# Patient Record
Sex: Male | Born: 1960 | Race: White | Hispanic: No | Marital: Married | Smoking: Former smoker
Health system: Southern US, Community
[De-identification: ages and names within clinical notes are randomized; demographics above are authoritative.]

## PROBLEM LIST (undated history)

## (undated) DIAGNOSIS — S52352A Displaced comminuted fracture of shaft of radius, left arm, initial encounter for closed fracture: Secondary | ICD-10-CM

## (undated) DIAGNOSIS — D124 Benign neoplasm of descending colon: Secondary | ICD-10-CM

## (undated) DIAGNOSIS — D125 Benign neoplasm of sigmoid colon: Secondary | ICD-10-CM

## (undated) DIAGNOSIS — I10 Essential (primary) hypertension: Secondary | ICD-10-CM

## (undated) DIAGNOSIS — S52532A Colles' fracture of left radius, initial encounter for closed fracture: Secondary | ICD-10-CM

## (undated) DIAGNOSIS — S52513A Displaced fracture of unspecified radial styloid process, initial encounter for closed fracture: Secondary | ICD-10-CM

## (undated) DIAGNOSIS — S0292XA Unspecified fracture of facial bones, initial encounter for closed fracture: Secondary | ICD-10-CM

## (undated) HISTORY — PX: BRAIN SURGERY: SHX531

## (undated) HISTORY — DX: Benign neoplasm of descending colon: D12.4

## (undated) HISTORY — PX: NO PAST SURGERIES: SHX2092

## (undated) HISTORY — DX: Benign neoplasm of sigmoid colon: D12.5

## (undated) HISTORY — DX: Displaced fracture of unspecified radial styloid process, initial encounter for closed fracture: S52.513A

## (undated) HISTORY — DX: Displaced comminuted fracture of shaft of radius, left arm, initial encounter for closed fracture: S52.352A

## (undated) HISTORY — DX: Unspecified fracture of facial bones, initial encounter for closed fracture: S02.92XA

## (undated) HISTORY — DX: Colles' fracture of left radius, initial encounter for closed fracture: S52.532A

## (undated) HISTORY — PX: COSMETIC SURGERY: SHX468

---

## 2006-09-16 ENCOUNTER — Emergency Department: Payer: Self-pay | Admitting: Emergency Medicine

## 2007-04-23 ENCOUNTER — Emergency Department: Payer: Self-pay | Admitting: Emergency Medicine

## 2007-10-24 ENCOUNTER — Emergency Department: Payer: Self-pay | Admitting: Emergency Medicine

## 2010-10-04 ENCOUNTER — Emergency Department: Payer: Self-pay | Admitting: Unknown Physician Specialty

## 2011-08-10 LAB — CBC AND DIFFERENTIAL
NEUTROS ABS: 3 /uL
WBC: 6.8 10^3/mL

## 2011-08-10 LAB — LIPID PANEL
Cholesterol: 187 mg/dL (ref 0–200)
HDL: 48 mg/dL (ref 35–70)
LDL Cholesterol: 120 mg/dL
LDl/HDL Ratio: 2.5
TRIGLYCERIDES: 96 mg/dL (ref 40–160)

## 2011-08-10 LAB — BASIC METABOLIC PANEL
BUN: 18 mg/dL (ref 4–21)
CREATININE: 1.1 mg/dL (ref 0.6–1.3)
Glucose: 84 mg/dL
Sodium: 140 mmol/L (ref 137–147)

## 2011-08-10 LAB — HEPATIC FUNCTION PANEL
ALK PHOS: 53 U/L (ref 25–125)
ALT: 21 U/L (ref 10–40)
AST: 21 U/L (ref 14–40)
BILIRUBIN, TOTAL: 0.4 mg/dL

## 2011-08-10 LAB — PSA: PSA: 0.9

## 2014-09-07 ENCOUNTER — Other Ambulatory Visit: Payer: Self-pay | Admitting: Family Medicine

## 2014-10-18 ENCOUNTER — Other Ambulatory Visit: Payer: Self-pay | Admitting: Family Medicine

## 2014-11-14 ENCOUNTER — Other Ambulatory Visit: Payer: Self-pay | Admitting: Family Medicine

## 2014-12-14 ENCOUNTER — Other Ambulatory Visit: Payer: Self-pay | Admitting: Family Medicine

## 2015-01-01 ENCOUNTER — Emergency Department
Admission: EM | Admit: 2015-01-01 | Discharge: 2015-01-02 | Disposition: A | Discharge status: Other Acute Inpatient Hospital | Payer: Self-pay | Attending: Emergency Medicine | Admitting: Emergency Medicine

## 2015-01-01 ENCOUNTER — Encounter: Payer: Self-pay | Admitting: *Deleted

## 2015-01-01 DIAGNOSIS — T1490XA Injury, unspecified, initial encounter: Secondary | ICD-10-CM

## 2015-01-01 DIAGNOSIS — T22112A Burn of first degree of left forearm, initial encounter: Secondary | ICD-10-CM | POA: Insufficient documentation

## 2015-01-01 DIAGNOSIS — Y9389 Activity, other specified: Secondary | ICD-10-CM | POA: Insufficient documentation

## 2015-01-01 DIAGNOSIS — Z87891 Personal history of nicotine dependence: Secondary | ICD-10-CM | POA: Insufficient documentation

## 2015-01-01 DIAGNOSIS — Z79899 Other long term (current) drug therapy: Secondary | ICD-10-CM | POA: Insufficient documentation

## 2015-01-01 DIAGNOSIS — J705 Respiratory conditions due to smoke inhalation: Secondary | ICD-10-CM | POA: Insufficient documentation

## 2015-01-01 DIAGNOSIS — IMO0002 Reserved for concepts with insufficient information to code with codable children: Secondary | ICD-10-CM

## 2015-01-01 DIAGNOSIS — I10 Essential (primary) hypertension: Secondary | ICD-10-CM | POA: Insufficient documentation

## 2015-01-01 DIAGNOSIS — T24011A Burn of unspecified degree of right thigh, initial encounter: Secondary | ICD-10-CM | POA: Insufficient documentation

## 2015-01-01 DIAGNOSIS — W368XXA Explosion and rupture of other gas cylinder, initial encounter: Secondary | ICD-10-CM | POA: Insufficient documentation

## 2015-01-01 DIAGNOSIS — T22211A Burn of second degree of right forearm, initial encounter: Secondary | ICD-10-CM | POA: Insufficient documentation

## 2015-01-01 DIAGNOSIS — Y9289 Other specified places as the place of occurrence of the external cause: Secondary | ICD-10-CM | POA: Insufficient documentation

## 2015-01-01 DIAGNOSIS — T24012A Burn of unspecified degree of left thigh, initial encounter: Secondary | ICD-10-CM | POA: Insufficient documentation

## 2015-01-01 DIAGNOSIS — Y998 Other external cause status: Secondary | ICD-10-CM | POA: Insufficient documentation

## 2015-01-01 HISTORY — DX: Essential (primary) hypertension: I10

## 2015-01-01 LAB — CBC WITH DIFFERENTIAL/PLATELET
BASOS ABS: 0.1 10*3/uL (ref 0–0.1)
Basophils Relative: 1 %
EOS PCT: 6 %
Eosinophils Absolute: 0.5 10*3/uL (ref 0–0.7)
HCT: 45.3 % (ref 40.0–52.0)
Hemoglobin: 15.3 g/dL (ref 13.0–18.0)
LYMPHS PCT: 29 %
Lymphs Abs: 2.3 10*3/uL (ref 1.0–3.6)
MCH: 30.7 pg (ref 26.0–34.0)
MCHC: 33.7 g/dL (ref 32.0–36.0)
MCV: 91 fL (ref 80.0–100.0)
MONOS PCT: 8 %
Monocytes Absolute: 0.6 10*3/uL (ref 0.2–1.0)
Neutro Abs: 4.6 10*3/uL (ref 1.4–6.5)
Neutrophils Relative %: 56 %
Platelets: 252 10*3/uL (ref 150–440)
RBC: 4.98 MIL/uL (ref 4.40–5.90)
RDW: 11.9 % (ref 11.5–14.5)
WBC: 8.1 10*3/uL (ref 3.8–10.6)

## 2015-01-01 LAB — BASIC METABOLIC PANEL
ANION GAP: 6 (ref 5–15)
BUN: 24 mg/dL — ABNORMAL HIGH (ref 6–20)
CALCIUM: 9.2 mg/dL (ref 8.9–10.3)
CO2: 27 mmol/L (ref 22–32)
CREATININE: 1.28 mg/dL — AB (ref 0.61–1.24)
Chloride: 102 mmol/L (ref 101–111)
GFR calc Af Amer: >60 mL/min (ref >60)
GLUCOSE: 93 mg/dL (ref 65–99)
Potassium: 3.9 mmol/L (ref 3.5–5.1)
Sodium: 135 mmol/L (ref 135–145)

## 2015-01-01 MED ORDER — OXYCODONE-ACETAMINOPHEN 5-325 MG PO TABS: 1.0000 | ORAL_TABLET | Freq: Four times a day (QID) | ORAL | Status: DC | PRN

## 2015-01-01 MED ORDER — MORPHINE SULFATE (PF) 4 MG/ML IV SOLN
4.0000 mg | Freq: Once | INTRAVENOUS | Status: AC
  Administered 2015-01-01: 4 mg via INTRAVENOUS
  Filled 2015-01-01: qty 1

## 2015-01-01 MED ORDER — SILVER SULFADIAZINE 1 % EX CREA: TOPICAL_CREAM | CUTANEOUS | Status: DC

## 2015-01-01 MED ORDER — SILVER SULFADIAZINE 1 % EX CREA: TOPICAL_CREAM | Freq: Once | CUTANEOUS | Status: DC

## 2015-01-01 MED ORDER — SODIUM CHLORIDE 0.9 % IV SOLN
Freq: Once | INTRAVENOUS | Status: AC
  Administered 2015-01-01: 21:00:00 via INTRAVENOUS
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
## 2015-01-01 NOTE — Discharge Instructions (Signed)
Return immediately for any throat pain, difficulty breathing or for any other concerning or worsening symptoms. Burn Care Burns hurt your skin. When your skin is hurt, it is easier to get an infection. Follow your doctor's directions to help prevent an infection. HOME CARE  Wash your hands well before you change your bandage.  Change your bandage as often as told by your doctor.  Remove the old bandage. If the bandage sticks, soak it off with cool, clean water.  Gently clean the burn with mild soap and water.  Pat the burn dry with a clean, dry cloth.  Put a thin layer of medicated cream on the burn.  Put a clean bandage on as told by your doctor.  Keep the bandage clean and dry.  Raise (elevate) the burn for the first 24 hours. After that, follow your doctor's directions.  Only take medicine as told by your doctor. GET HELP RIGHT AWAY IF:   You have too much pain.  The skin near the burn is red, tender, puffy (swollen), or has red streaks.  The burn area has yellowish white fluid (pus) or a bad smell coming from it.  You have a fever. MAKE SURE YOU:   Understand these instructions.  Will watch your condition.  Will get help right away if you are not doing well or get worse. Document Released: 12/28/2007 Document Revised: 06/12/2011 Document Reviewed: 08/10/2010 [facility] Patient Information 2015 [place], [place]. This information is not intended to replace advice given to you by your health care provider. Make sure you discuss any questions you have with your health care provider.  Second-Degree Burn A second-degree burn affects the 2 outer layers of skin. The outer layer (epidermis) and the layer underneath it (dermis) are both burned. Another name for this type of burn is a partial thickness burn. A second-degree burn may be called minor or major. This depends on the size of the burn. It also depends on what parts of the skin are burned. Minor burns may be treated with  first aid. Major burns are a medical emergency. A second-degree burn is worse than a first-degree burn, but not as bad as a third-degree burn. A first-degree burn affects only the epidermis. A third-degree burn goes through all the layers of skin. A second-degree burn usually heals in 3 to 4 weeks. A minor second-degree burn usually does not leave a scar.Deeper second-degree burns may lead to scarring of the skin or contractures over joints.Contractures are scars that form over joints and may lead to reduced mobility at those joints. CAUSES  Heat (thermal) injury. This happens when skin comes in contact with something very hot. It could be a flame, a hot object, hot liquid, or steam. Most second-degree burns are thermal injuries.  Radiation. Sunlight is one type of radiation that can burn the skin. Another type of radiation is used to heat food. Radiation is also used to treat some diseases, such as cancer. All types of radiation can burn the skin. Sunlight usually causes a first-degree burn. Radiation used for heating food or treating a disease can cause a second-degree burn.  Electricity. Electrical burns can cause more damage under the skin than on the surface. They should always be treated as major burns.  Chemicals. Many chemicals can burn the skin. The burn should be flushed with cool water and checked by an emergency caregiver. SYMPTOMS Symptoms of second-degree burns include:  Severe pain.  Extreme tenderness.  Deep redness.  Blistered skin.  Skin that  has changed color.It might look blotchy, wet, or shiny.  Swelling. TREATMENT Some second-degree burns may need to be treated in a hospital. These include major burns, electrical burns, and chemical burns. Many other second-degree burns can be treated with regular first aid, such as:  Cooling the burn. Use cool, germ-free (sterile) salt water. Place the burned area of skin into a tub of water, or cover the burned area with clean,  wet towels.  Taking pain medicine.  Removing the dead skin from broken blisters. A trained caregiver may do this. Do not pop blisters.  Gently washing your skin with mild soap.  Covering the burned area with a cream.Silver sulfadiazine is a cream for burns. An antibiotic cream, such as bacitracin, may also be used to fight infection. Do not use other ointments or creams unless your caregiver says it is okay.  Protecting the burn with a sterile, non-sticky bandage.  Bandaging fingers and toes separately. This keeps them from sticking together.  Taking an antibiotic. This can help prevent infection.  Getting a tetanus shot. HOME CARE INSTRUCTIONS Medication  Take any medicine prescribed by your caregiver. Follow the directions carefully.  Ask your caregiver if you can take over-the-counter medicine to relieve pain and swelling. Do not give aspirin to children.  Make sure your caregiver knows about all other medicines you take.This includes over-the-counter medicines. Burn care  You will need to change the bandage on your burn. You may need to do this 2 or 3 times each day.  Gently clean the burned area.  Put ointment on it.  Cover the burn with a sterile bandage.  For some deeper burns or burns that cover a large area, compression garments may be prescribed. These garments can help minimize scarring and protect your mobility.  Do not put butter or oil on your skin. Use only the cream prescribed by your caregiver.  Do not put ice on your burn.  Do not break blisters on your skin.  Keep the bandaged area dry. You might need to take a sponge bath for awhile.Ask your caregiver when you can take a shower or a tub bath again.  Do not scratch an itchy burn. Your caregiver may give you medicine to relieve very bad itching.  Infection is a big danger after a second-degree burn. Tell your caregiver right away if you have signs of infection, such as:  Redness or changing color  in the burned area.  Fluid leaking from the burn.  Swelling in the burn area.  A bad smell coming from the wound. Follow-up  Keep all follow-up appointments.This is important. This is how your caregiver can tell if your treatment is working.  Protect your burn from sunlight.Use sunscreen whenever you go outside.Burned areas may be sensitive to the sun for up to 1 year. Exposure to the sun may also cause permanent darkening of scars. SEEK MEDICAL CARE IF:  You have any questions about medicines.  You have any questions about your treatment.  You wonder if it is okay to do a particular activity.  You develop a fever of more than 100.5 F (38.1 C). SEEK IMMEDIATE MEDICAL CARE IF:  You think your burn might be infected. It may change color, become red, leak fluid, swell, or smell bad.  You develop a fever of more than 102 F (38.9 C). Document Released: 08/22/2010 Document Revised: 06/12/2011 Document Reviewed: 08/22/2010 [facility] Patient Information 2015 [place], [place]. This information is not intended to replace advice given to you by  your health care provider. Make sure you discuss any questions you have with your health care provider. ° °
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
## 2015-01-01 NOTE — ED Notes (Signed)
Patient requesting not to be transferred to [facility], patient reports "I am not having any trouble breathing..my lungs are fine." Dr. Clearnce Hasten in room to discuss the risks of not being transferred. Patient verbalized understanding of risks and reports he does not want to be transferred. Patient verbalizes he will be leaving against medical advice. Patient denies difficulty breathing, patient speaking in complete sentences, no increased work in breathing noted.
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
## 2015-01-02 MED ORDER — MORPHINE SULFATE (PF) 4 MG/ML IV SOLN
INTRAVENOUS | Status: AC
  Filled 2015-01-02: qty 1

## 2015-01-02 MED ORDER — MORPHINE SULFATE (PF) 4 MG/ML IV SOLN
4.0000 mg | Freq: Once | INTRAVENOUS | Status: AC
  Administered 2015-01-02: 4 mg via INTRAVENOUS
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
## 2015-01-13 DIAGNOSIS — T22219A Burn of second degree of unspecified forearm, initial encounter: Secondary | ICD-10-CM | POA: Insufficient documentation

## 2015-01-13 DIAGNOSIS — T24299S Burn of second degree of multiple sites of unspecified lower limb, except ankle and foot, sequela: Secondary | ICD-10-CM

## 2015-01-13 HISTORY — DX: Burn of second degree of multiple sites of unspecified lower limb, except ankle and foot, sequela: T24.299S

## 2015-01-16 ENCOUNTER — Other Ambulatory Visit: Payer: Self-pay | Admitting: Family Medicine

## 2015-01-28 DIAGNOSIS — G4700 Insomnia, unspecified: Secondary | ICD-10-CM | POA: Insufficient documentation

## 2015-01-28 DIAGNOSIS — F1020 Alcohol dependence, uncomplicated: Secondary | ICD-10-CM | POA: Insufficient documentation

## 2015-01-28 DIAGNOSIS — A630 Anogenital (venereal) warts: Secondary | ICD-10-CM | POA: Insufficient documentation

## 2015-01-28 DIAGNOSIS — I10 Essential (primary) hypertension: Secondary | ICD-10-CM | POA: Insufficient documentation

## 2015-01-28 HISTORY — DX: Alcohol dependence, uncomplicated: F10.20

## 2015-02-08 ENCOUNTER — Encounter: Payer: Self-pay | Admitting: Family Medicine

## 2015-02-08 ENCOUNTER — Ambulatory Visit (INDEPENDENT_AMBULATORY_CARE_PROVIDER_SITE_OTHER): Payer: Self-pay | Admitting: Family Medicine

## 2015-02-08 VITALS — BP 112/78 | HR 76 | Temp 97.6°F | Resp 16 | Wt 206.0 lb

## 2015-02-08 DIAGNOSIS — T24299S Burn of second degree of multiple sites of unspecified lower limb, except ankle and foot, sequela: Secondary | ICD-10-CM

## 2015-02-08 DIAGNOSIS — M545 Low back pain: Secondary | ICD-10-CM

## 2015-02-08 DIAGNOSIS — G4700 Insomnia, unspecified: Secondary | ICD-10-CM

## 2015-02-08 DIAGNOSIS — I10 Essential (primary) hypertension: Secondary | ICD-10-CM

## 2015-02-08 MED ORDER — NAPROXEN 500 MG PO TABS: 500.0000 mg | ORAL_TABLET | Freq: Two times a day (BID) | ORAL | Status: DC
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
## 2015-02-13 ENCOUNTER — Other Ambulatory Visit: Payer: Self-pay | Admitting: Family Medicine

## 2015-03-12 ENCOUNTER — Other Ambulatory Visit: Payer: Self-pay | Admitting: Family Medicine

## 2015-04-10 ENCOUNTER — Other Ambulatory Visit: Payer: Self-pay | Admitting: Family Medicine

## 2015-05-15 ENCOUNTER — Other Ambulatory Visit: Payer: Self-pay | Admitting: Family Medicine

## 2015-06-12 ENCOUNTER — Other Ambulatory Visit: Payer: Self-pay | Admitting: Family Medicine

## 2015-07-09 ENCOUNTER — Other Ambulatory Visit: Payer: Self-pay | Admitting: Family Medicine

## 2015-08-11 ENCOUNTER — Encounter: Payer: Self-pay | Admitting: Family Medicine

## 2015-08-11 ENCOUNTER — Ambulatory Visit (INDEPENDENT_AMBULATORY_CARE_PROVIDER_SITE_OTHER): Payer: 59 | Admitting: Family Medicine

## 2015-08-11 ENCOUNTER — Telehealth: Payer: Self-pay | Admitting: Family Medicine

## 2015-08-11 VITALS — BP 118/82 | HR 80 | Temp 97.5°F | Resp 18 | Ht 73.0 in | Wt 201.0 lb

## 2015-08-11 DIAGNOSIS — Z125 Encounter for screening for malignant neoplasm of prostate: Secondary | ICD-10-CM | POA: Diagnosis not present

## 2015-08-11 DIAGNOSIS — Z0000 Encounter for general adult medical examination without abnormal findings: Secondary | ICD-10-CM

## 2015-08-11 DIAGNOSIS — Z1211 Encounter for screening for malignant neoplasm of colon: Secondary | ICD-10-CM

## 2015-08-11 LAB — POCT URINALYSIS DIPSTICK
BILIRUBIN UA: NEGATIVE
Blood, UA: NEGATIVE
Glucose, UA: NEGATIVE
KETONES UA: NEGATIVE
Leukocytes, UA: NEGATIVE
Nitrite, UA: NEGATIVE
PROTEIN UA: NEGATIVE
SPEC GRAV UA: 1.015
Urobilinogen, UA: 0.2
pH, UA: 5

## 2015-08-11 MED ORDER — CYCLOBENZAPRINE HCL 10 MG PO TABS: 10.0000 mg | ORAL_TABLET | Freq: Three times a day (TID) | ORAL | Status: DC | PRN

## 2015-08-11 NOTE — Telephone Encounter (Signed)
Took care of this and pt was advised-aa
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
## 2015-08-11 NOTE — Telephone Encounter (Signed)
Pt stated that when he was in the office today for his CPE and he had discussed needing a new RX for Flexeril (cyclobenzaprine). Pt stated that he was advised by Dr. Rosanna Randy that he would send the RX to [place]. Please advise. Thanks TNP

## 2015-08-14 LAB — CBC WITH DIFFERENTIAL/PLATELET
Basophils Absolute: 0.1 10*3/uL (ref 0.0–0.2)
Basos: 2 %
EOS (ABSOLUTE): 0.4 10*3/uL (ref 0.0–0.4)
Eos: 9 %
HEMATOCRIT: 40.6 % (ref 37.5–51.0)
Hemoglobin: 13.5 g/dL (ref 12.6–17.7)
IMMATURE GRANULOCYTES: 0 %
Immature Grans (Abs): 0 10*3/uL (ref 0.0–0.1)
LYMPHS ABS: 1.8 10*3/uL (ref 0.7–3.1)
Lymphs: 35 %
MCH: 30.7 pg (ref 26.6–33.0)
MCHC: 33.3 g/dL (ref 31.5–35.7)
MCV: 92 fL (ref 79–97)
MONOS ABS: 0.4 10*3/uL (ref 0.1–0.9)
Monocytes: 9 %
NEUTROS PCT: 45 %
Neutrophils Absolute: 2.3 10*3/uL (ref 1.4–7.0)
PLATELETS: 263 10*3/uL (ref 150–379)
RBC: 4.4 x10E6/uL (ref 4.14–5.80)
RDW: 12.7 % (ref 12.3–15.4)
WBC: 5.1 10*3/uL (ref 3.4–10.8)

## 2015-08-14 LAB — COMPREHENSIVE METABOLIC PANEL
A/G RATIO: 1.9 (ref 1.2–2.2)
ALK PHOS: 53 IU/L (ref 39–117)
ALT: 16 IU/L (ref 0–44)
AST: 15 IU/L (ref 0–40)
Albumin: 4.3 g/dL (ref 3.5–5.5)
BUN/Creatinine Ratio: 15 (ref 9–20)
BUN: 19 mg/dL (ref 6–24)
Bilirubin Total: 0.5 mg/dL (ref 0.0–1.2)
CALCIUM: 9.4 mg/dL (ref 8.7–10.2)
CHLORIDE: 102 mmol/L (ref 96–106)
CO2: 24 mmol/L (ref 18–29)
CREATININE: 1.25 mg/dL (ref 0.76–1.27)
GFR calc Af Amer: 74 mL/min/{1.73_m2} (ref >59)
GFR, EST NON AFRICAN AMERICAN: 64 mL/min/{1.73_m2} (ref >59)
GLOBULIN, TOTAL: 2.3 g/dL (ref 1.5–4.5)
Glucose: 60 mg/dL — ABNORMAL LOW (ref 65–99)
POTASSIUM: 4.6 mmol/L (ref 3.5–5.2)
Sodium: 140 mmol/L (ref 134–144)
Total Protein: 6.6 g/dL (ref 6.0–8.5)

## 2015-08-14 LAB — LIPID PANEL WITH LDL/HDL RATIO
Cholesterol, Total: 174 mg/dL (ref 100–199)
HDL: 36 mg/dL — AB (ref >39)
LDL Calculated: 123 mg/dL — ABNORMAL HIGH (ref 0–99)
LDl/HDL Ratio: 3.4 ratio units (ref 0.0–3.6)
TRIGLYCERIDES: 73 mg/dL (ref 0–149)
VLDL Cholesterol Cal: 15 mg/dL (ref 5–40)

## 2015-08-14 LAB — TSH: TSH: 1.44 u[IU]/mL (ref 0.450–4.500)

## 2015-08-14 LAB — PSA: Prostate Specific Ag, Serum: 0.8 ng/mL (ref 0.0–4.0)

## 2015-08-26 ENCOUNTER — Telehealth: Payer: Self-pay

## 2015-08-26 ENCOUNTER — Other Ambulatory Visit: Payer: Self-pay

## 2015-08-26 NOTE — Telephone Encounter (Signed)
Gastroenterology Pre-Procedure Review  Request Date: 10/12/15 Requesting Physician: Dr. Rosanna Randy   PATIENT REVIEW QUESTIONS: The patient responded to the following health history questions as indicated:    1. Are you having any GI issues? no 2. Do you have a personal history of Polyps? no 3. Do you have a family history of Colon Cancer or Polyps? no 4. Diabetes Mellitus? no 5. Joint replacements in the past 12 months?no 6. Major health problems in the past 3 months?no 7. Any artificial heart valves, MVP, or defibrillator?no    MEDICATIONS & ALLERGIES:    Patient reports the following regarding taking any anticoagulation/antiplatelet therapy:   Plavix, Coumadin, Eliquis, Xarelto, Lovenox, Pradaxa, Brilinta, or Effient? no Aspirin? no  Patient confirms/reports the following medications:  Current Outpatient Prescriptions  Medication Sig Dispense Refill  . amitriptyline (ELAVIL) 25 MG tablet TAKE ONE TABLET BY MOUTH AT BEDTIME AS NEEDED FOR SLEEP 30 tablet 12  . ascorbic acid (VITAMIN C) 100 MG tablet Take by mouth. Reported on 08/11/2015    . cyclobenzaprine (FLEXERIL) 10 MG tablet Take 1 tablet (10 mg total) by mouth 3 (three) times daily as needed for muscle spasms. 90 tablet 12  . Garlic 99991111 MG CAPS Take by mouth.    Marland Kitchen GLUCOSAMINE-CHONDROIT-VIT C-MN PO Take by mouth.    Marland Kitchen lisinopril (PRINIVIL,ZESTRIL) 10 MG tablet TAKE ONE TABLET BY MOUTH ONCE DAILY 30 tablet 12  . Multiple Vitamin tablet Take by mouth.    . naproxen (NAPROSYN) 500 MG tablet Take 1 tablet (500 mg total) by mouth 2 (two) times daily with a meal. 60 tablet 12   No current facility-administered medications for this visit.    Patient confirms/reports the following allergies:  No Known Allergies  No orders of the defined types were placed in this encounter.    AUTHORIZATION INFORMATION Primary Insurance: 1D#: Group #:  Secondary Insurance: 1D#: Group #:  SCHEDULE INFORMATION: Date:  10/12/15 Time: Location: [facility]

## 2015-10-11 ENCOUNTER — Encounter: Payer: Self-pay | Admitting: *Deleted

## 2015-10-12 ENCOUNTER — Encounter
Admission: RE | Disposition: A | Discharge status: Home / Self Care | Payer: Self-pay | Source: Ambulatory Visit | Attending: Gastroenterology

## 2015-10-12 ENCOUNTER — Encounter: Payer: Self-pay | Admitting: *Deleted

## 2015-10-12 ENCOUNTER — Ambulatory Visit
Admission: RE | Admit: 2015-10-12 | Discharge: 2015-10-12 | Disposition: A | Discharge status: Home / Self Care | Payer: 59 | Source: Ambulatory Visit | Attending: Gastroenterology | Admitting: Gastroenterology

## 2015-10-12 ENCOUNTER — Ambulatory Visit: Payer: 59 | Admitting: Anesthesiology

## 2015-10-12 DIAGNOSIS — Z1211 Encounter for screening for malignant neoplasm of colon: Secondary | ICD-10-CM | POA: Diagnosis present

## 2015-10-12 DIAGNOSIS — D125 Benign neoplasm of sigmoid colon: Secondary | ICD-10-CM | POA: Diagnosis not present

## 2015-10-12 DIAGNOSIS — I10 Essential (primary) hypertension: Secondary | ICD-10-CM | POA: Diagnosis not present

## 2015-10-12 DIAGNOSIS — Z87891 Personal history of nicotine dependence: Secondary | ICD-10-CM | POA: Insufficient documentation

## 2015-10-12 DIAGNOSIS — K641 Second degree hemorrhoids: Secondary | ICD-10-CM | POA: Diagnosis not present

## 2015-10-12 DIAGNOSIS — D124 Benign neoplasm of descending colon: Secondary | ICD-10-CM | POA: Diagnosis not present

## 2015-10-12 DIAGNOSIS — D123 Benign neoplasm of transverse colon: Secondary | ICD-10-CM | POA: Diagnosis not present

## 2015-10-12 HISTORY — PX: COLONOSCOPY WITH PROPOFOL: SHX5780

## 2015-10-12 SURGERY — COLONOSCOPY WITH PROPOFOL
Anesthesia: General

## 2015-10-12 MED ORDER — LACTATED RINGERS IV SOLN
INTRAVENOUS | Status: DC | PRN
  Administered 2015-10-12: 09:00:00 via INTRAVENOUS

## 2015-10-12 MED ORDER — MIDAZOLAM HCL 2 MG/2ML IJ SOLN
INTRAMUSCULAR | Status: DC | PRN
  Administered 2015-10-12: 1 mg via INTRAVENOUS

## 2015-10-12 MED ORDER — SODIUM CHLORIDE 0.9 % IV SOLN
INTRAVENOUS | Status: DC
  Administered 2015-10-12: 1000 mL via INTRAVENOUS

## 2015-10-12 MED ORDER — PROPOFOL 500 MG/50ML IV EMUL
INTRAVENOUS | Status: DC | PRN
  Administered 2015-10-12: 100 ug/kg/min via INTRAVENOUS

## 2015-10-12 MED ORDER — PROPOFOL 10 MG/ML IV BOLUS
INTRAVENOUS | Status: DC | PRN
  Administered 2015-10-12: 50 mg via INTRAVENOUS

## 2015-10-12 NOTE — Anesthesia Preprocedure Evaluation (Signed)
Anesthesia Evaluation  Patient identified by MRN, date of birth, ID band Patient awake    Reviewed: Allergy & Precautions, NPO status , Patient's Chart, lab work & pertinent test results  Airway Mallampati: II       Dental  (+) Teeth Intact   Pulmonary former smoker,    breath sounds clear to auscultation       Cardiovascular Exercise Tolerance: Good hypertension, Pt. on medications  Rhythm:Regular Rate:Normal     Neuro/Psych    GI/Hepatic negative GI ROS, (+) Cirrhosis     substance abuse  alcohol use,   Endo/Other  negative endocrine ROS  Renal/GU negative Renal ROS     Musculoskeletal negative musculoskeletal ROS (+)   Abdominal Normal abdominal exam  (+)   Peds negative pediatric ROS (+)  Hematology negative hematology ROS (+)   Anesthesia Other Findings   Reproductive/Obstetrics                             Anesthesia Physical Anesthesia Plan  ASA: II  Anesthesia Plan: General   Post-op Pain Management:    Induction: Intravenous  Airway Management Planned: Natural Airway and Nasal Cannula  Additional Equipment:   Intra-op Plan:   Post-operative Plan:   Informed Consent: I have reviewed the patients History and Physical, chart, labs and discussed the procedure including the risks, benefits and alternatives for the proposed anesthesia with the patient or authorized representative who has indicated his/her understanding and acceptance.     Plan Discussed with: CRNA  Anesthesia Plan Comments:         Anesthesia Quick Evaluation

## 2015-10-12 NOTE — Transfer of Care (Signed)
Immediate Anesthesia Transfer of Care Note  Patient: Justin Fox  Procedure(s) Performed: Procedure(s): COLONOSCOPY WITH PROPOFOL (N/A)  Patient Location: PACU  Anesthesia Type:General  Level of Consciousness: sedated  Airway & Oxygen Therapy: Patient Spontanous Breathing  Post-op Assessment: Report given to RN  Post vital signs: Reviewed and stable  Last Vitals:  Filed Vitals:   10/12/15 0833  BP: 132/89  Pulse: 74  Temp: 36.2 C  Resp: 18    Last Pain: There were no vitals filed for this visit.       Complications: No apparent anesthesia complications

## 2015-10-12 NOTE — H&P (Signed)
  Lucilla Lame, MD [address] [place], [place] [address] Phone: [phone] Fax : [phone]  Primary Care Physician:  [name], MD Primary Gastroenterologist:  Dr. Allen Norris  Pre-Procedure History & Physical: HPI:  Justin Fox is a 55 y.o. male is here for a screening colonoscopy.   Past Medical History  Diagnosis Date  . Hypertension     Past Surgical History  Procedure Laterality Date  . No past surgeries      Prior to Admission medications   Medication Sig Start Date End Date Taking? Authorizing Provider  lisinopril (PRINIVIL,ZESTRIL) 10 MG tablet TAKE ONE TABLET BY MOUTH ONCE DAILY 07/09/15  Yes Richard Maceo Pro., MD  amitriptyline (ELAVIL) 25 MG tablet TAKE ONE TABLET BY MOUTH AT BEDTIME AS NEEDED FOR SLEEP 07/09/15   Richard Maceo Pro., MD  ascorbic acid (VITAMIN C) 100 MG tablet Take by mouth. Reported on 08/11/2015    Historical Provider, MD  cyclobenzaprine (FLEXERIL) 10 MG tablet Take 1 tablet (10 mg total) by mouth 3 (three) times daily as needed for muscle spasms. 08/11/15   Richard Maceo Pro., MD  Garlic 99991111 MG CAPS Take by mouth.    Historical Provider, MD  GLUCOSAMINE-CHONDROIT-VIT C-MN PO Take by mouth. 04/11/12   Historical Provider, MD  Multiple Vitamin tablet Take by mouth.    Historical Provider, MD  naproxen (NAPROSYN) 500 MG tablet Take 1 tablet (500 mg total) by mouth 2 (two) times daily with a meal. 02/08/15   Jerrol Banana., MD    Allergies as of 08/26/2015  . (No Known Allergies)    Family History  Problem Relation Age of Onset  . Anxiety disorder Mother   . Thyroid disease Mother     Social History   Social History  . Marital Status: Married    Spouse Name: N/A  . Number of Children: N/A  . Years of Education: N/A   Occupational History  . Not on file.   Social History Main Topics  . Smoking status: Former Smoker -- 1.50 packs/day for 35 years    Types: Cigarettes    Quit date: 04/03/2008  .  Smokeless tobacco: Never Used     Comment: Has been quit for 7-8 years  . Alcohol Use: No     Comment: Has had issues with this in the past. Has not drank in 7-8 years.  . Drug Use: No  . Sexual Activity: No   Other Topics Concern  . Not on file   Social History Narrative    Review of Systems: See HPI, otherwise negative ROS  Physical Exam: BP 132/89 mmHg  Pulse 74  Temp(Src) 97.1 F (36.2 C) (Tympanic)  Resp 18  Ht 6\' 1"  (1.854 m)  Wt 190 lb (86.183 kg)  BMI 25.07 kg/m2  SpO2 100% General:   Alert,  pleasant and cooperative in NAD Head:  Normocephalic and atraumatic. Neck:  Supple; no masses or thyromegaly. Lungs:  Clear throughout to auscultation.    Heart:  Regular rate and rhythm. Abdomen:  Soft, nontender and nondistended. Normal bowel sounds, without guarding, and without rebound.   Neurologic:  Alert and  oriented x4;  grossly normal neurologically.  Impression/Plan: Justin Fox is now here to undergo a screening colonoscopy.  Risks, benefits, and alternatives regarding colonoscopy have been reviewed with the patient.  Questions have been answered.  All parties agreeable.

## 2015-10-12 NOTE — Op Note (Signed)
[facility] Gastroenterology Patient Name: Justin Fox Procedure Date: 10/12/2015 9:23 AM MRN: [id number] Account #: [id number] Date of Birth: [birth year] Admit Type: Outpatient Age: 55 Room: [facility] ENDO ROOM 4 Gender: Male Note Status: Finalized Procedure:            Colonoscopy Indications:          Screening for colorectal malignant neoplasm Providers:            Lucilla Lame, MD Referring MD:         Janine Ores. Rosanna Randy, MD (Referring MD) Medicines:            Propofol per Anesthesia Complications:        No immediate complications. Procedure:            Pre-Anesthesia Assessment:                       - Prior to the procedure, a History and Physical was                        performed, and patient medications and allergies were                        reviewed. The patient's tolerance of previous                        anesthesia was also reviewed. The risks and benefits of                        the procedure and the sedation options and risks were                        discussed with the patient. All questions were                        answered, and informed consent was obtained. Prior                        Anticoagulants: The patient has taken no previous                        anticoagulant or antiplatelet agents. ASA Grade                        Assessment: II - A patient with mild systemic disease.                        After reviewing the risks and benefits, the patient was                        deemed in satisfactory condition to undergo the                        procedure.                       After obtaining informed consent, the colonoscope was                        passed under direct vision. Throughout the procedure,  the patient's blood pressure, pulse, and oxygen                        saturations were monitored continuously. The                        Colonoscope was introduced through the anus and         advanced to the the cecum, identified by appendiceal                        orifice and ileocecal valve. The colonoscopy was                        performed without difficulty. The patient tolerated the                        procedure well. The quality of the bowel preparation                        was excellent. Findings:      The perianal and digital rectal examinations were normal.      A 4 mm polyp was found in the descending colon. The polyp was sessile.       The polyp was removed with a cold biopsy forceps. Resection and       retrieval were complete.      Two sessile polyps were found in the sigmoid colon. The polyps were 4 to       5 mm in size. These polyps were removed with a cold snare. Resection and       retrieval were complete.      Non-bleeding internal hemorrhoids were found during retroflexion. The       hemorrhoids were Grade II (internal hemorrhoids that prolapse but reduce       spontaneously). Impression:           - One 4 mm polyp in the descending colon, removed with                        a cold biopsy forceps. Resected and retrieved.                       - Two 4 to 5 mm polyps in the sigmoid colon, removed                        with a cold snare. Resected and retrieved.                       - Non-bleeding internal hemorrhoids. Recommendation:       - Await pathology results.                       - Repeat colonoscopy in 5 years if polyp adenoma and 10                        years if hyperplastic Procedure Code(s):    --- Professional ---                       [phone], Colonoscopy, flexible; with removal of tumor(s),  polyp(s), or other lesion(s) by snare technique                       45380, 59, Colonoscopy, flexible; with biopsy, single                        or multiple Diagnosis Code(s):    --- Professional ---                       Z12.11, Encounter for screening for malignant neoplasm                        of colon                        D12.4, Benign neoplasm of descending colon                       D12.5, Benign neoplasm of sigmoid colon CPT copyright 2016 American Medical Association. All rights reserved. The codes documented in this report are preliminary and upon coder review may  be revised to meet current compliance requirements. Lucilla Lame, MD 10/12/2015 9:42:17 AM This report has been signed electronically. Number of Addenda: 0 Note Initiated On: 10/12/2015 9:23 AM Scope Withdrawal Time: 0 hours 9 minutes 26 seconds  Total Procedure Duration: 0 hours 12 minutes 36 seconds       [facility]

## 2015-10-12 NOTE — Anesthesia Postprocedure Evaluation (Signed)
Anesthesia Post Note  Patient: Justin Fox  Procedure(s) Performed: Procedure(s) (LRB): COLONOSCOPY WITH PROPOFOL (N/A)  Patient location during evaluation: PACU Anesthesia Type: General Level of consciousness: awake Pain management: pain level controlled Vital Signs Assessment: post-procedure vital signs reviewed and stable Respiratory status: spontaneous breathing Cardiovascular status: stable Anesthetic complications: no    Last Vitals:  Filed Vitals:   10/12/15 0951 10/12/15 0959  BP: 89/66 131/84  Pulse: 69 68  Temp: 36.3 C   Resp: 17 20    Last Pain: There were no vitals filed for this visit.               VAN STAVEREN,Mirko Tailor

## 2015-10-14 ENCOUNTER — Encounter: Payer: Self-pay | Admitting: Gastroenterology

## 2015-10-17 ENCOUNTER — Encounter: Payer: Self-pay | Admitting: Gastroenterology

## 2015-10-26 LAB — SURGICAL PATHOLOGY

## 2015-11-23 ENCOUNTER — Encounter: Payer: Self-pay | Admitting: Emergency Medicine

## 2015-11-23 ENCOUNTER — Emergency Department: Payer: 59

## 2015-11-23 ENCOUNTER — Emergency Department
Admission: EM | Admit: 2015-11-23 | Discharge: 2015-11-23 | Disposition: A | Discharge status: Other Acute Inpatient Hospital | Payer: 59 | Attending: Emergency Medicine | Admitting: Emergency Medicine

## 2015-11-23 DIAGNOSIS — S0101XA Laceration without foreign body of scalp, initial encounter: Secondary | ICD-10-CM | POA: Insufficient documentation

## 2015-11-23 DIAGNOSIS — S0990XA Unspecified injury of head, initial encounter: Secondary | ICD-10-CM | POA: Diagnosis present

## 2015-11-23 DIAGNOSIS — S62614A Displaced fracture of proximal phalanx of right ring finger, initial encounter for closed fracture: Secondary | ICD-10-CM | POA: Diagnosis not present

## 2015-11-23 DIAGNOSIS — S064X9A Epidural hemorrhage with loss of consciousness of unspecified duration, initial encounter: Secondary | ICD-10-CM | POA: Diagnosis not present

## 2015-11-23 DIAGNOSIS — Z87891 Personal history of nicotine dependence: Secondary | ICD-10-CM | POA: Diagnosis not present

## 2015-11-23 DIAGNOSIS — S0100XA Unspecified open wound of scalp, initial encounter: Secondary | ICD-10-CM | POA: Diagnosis not present

## 2015-11-23 DIAGNOSIS — S62617A Displaced fracture of proximal phalanx of left little finger, initial encounter for closed fracture: Secondary | ICD-10-CM | POA: Insufficient documentation

## 2015-11-23 DIAGNOSIS — Y939 Activity, unspecified: Secondary | ICD-10-CM | POA: Insufficient documentation

## 2015-11-23 DIAGNOSIS — S62615A Displaced fracture of proximal phalanx of left ring finger, initial encounter for closed fracture: Secondary | ICD-10-CM | POA: Insufficient documentation

## 2015-11-23 DIAGNOSIS — Z79899 Other long term (current) drug therapy: Secondary | ICD-10-CM | POA: Insufficient documentation

## 2015-11-23 DIAGNOSIS — I10 Essential (primary) hypertension: Secondary | ICD-10-CM | POA: Diagnosis not present

## 2015-11-23 DIAGNOSIS — S62609A Fracture of unspecified phalanx of unspecified finger, initial encounter for closed fracture: Secondary | ICD-10-CM

## 2015-11-23 DIAGNOSIS — Y929 Unspecified place or not applicable: Secondary | ICD-10-CM | POA: Insufficient documentation

## 2015-11-23 DIAGNOSIS — W1789XA Other fall from one level to another, initial encounter: Secondary | ICD-10-CM | POA: Insufficient documentation

## 2015-11-23 DIAGNOSIS — Y999 Unspecified external cause status: Secondary | ICD-10-CM | POA: Diagnosis not present

## 2015-11-23 LAB — CBC WITH DIFFERENTIAL/PLATELET
BASOS ABS: 0.1 10*3/uL (ref 0–0.1)
BASOS PCT: 1 %
EOS ABS: 0.4 10*3/uL (ref 0–0.7)
EOS PCT: 2 %
HCT: 44.8 % (ref 40.0–52.0)
Hemoglobin: 15.2 g/dL (ref 13.0–18.0)
LYMPHS PCT: 21 %
Lymphs Abs: 4.2 10*3/uL — ABNORMAL HIGH (ref 1.0–3.6)
MCH: 30.6 pg (ref 26.0–34.0)
MCHC: 34 g/dL (ref 32.0–36.0)
MCV: 90 fL (ref 80.0–100.0)
MONO ABS: 1 10*3/uL (ref 0.2–1.0)
Monocytes Relative: 5 %
Neutro Abs: 14.8 10*3/uL — ABNORMAL HIGH (ref 1.4–6.5)
Neutrophils Relative %: 71 %
PLATELETS: 288 10*3/uL (ref 150–440)
RBC: 4.98 MIL/uL (ref 4.40–5.90)
RDW: 12.2 % (ref 11.5–14.5)
WBC: 20.6 10*3/uL — AB (ref 3.8–10.6)

## 2015-11-23 LAB — BASIC METABOLIC PANEL
Anion gap: 7 (ref 5–15)
BUN: 24 mg/dL — AB (ref 6–20)
CALCIUM: 9 mg/dL (ref 8.9–10.3)
CO2: 24 mmol/L (ref 22–32)
CREATININE: 1.1 mg/dL (ref 0.61–1.24)
Chloride: 103 mmol/L (ref 101–111)
GFR calc Af Amer: >60 mL/min (ref >60)
GLUCOSE: 181 mg/dL — AB (ref 65–99)
POTASSIUM: 2.8 mmol/L — AB (ref 3.5–5.1)
SODIUM: 134 mmol/L — AB (ref 135–145)

## 2015-11-23 LAB — PROTIME-INR
INR: 1.04
PROTHROMBIN TIME: 13.6 s (ref 11.4–15.2)

## 2015-11-23 LAB — APTT: APTT: 30 s (ref 24–36)

## 2015-11-23 MED ORDER — LIDOCAINE HCL (PF) 1 % IJ SOLN
INTRAMUSCULAR | Status: AC | PRN
  Administered 2015-11-23: 30 mL via INTRADERMAL

## 2015-11-23 MED ORDER — SODIUM CHLORIDE 0.9 % IV BOLUS (SEPSIS)
1000.0000 mL | Freq: Once | INTRAVENOUS | Status: AC
  Administered 2015-11-23: 1000 mL via INTRAVENOUS

## 2015-11-23 MED ORDER — ROCURONIUM BROMIDE 50 MG/5ML IV SOLN
INTRAVENOUS | Status: AC | PRN
  Administered 2015-11-23: 80 mg via INTRAVENOUS

## 2015-11-23 MED ORDER — PROPOFOL 10 MG/ML IV BOLUS
INTRAVENOUS | Status: AC | PRN
  Administered 2015-11-23: 150 mg via INTRAVENOUS

## 2015-11-23 MED ORDER — PROPOFOL 1000 MG/100ML IV EMUL
INTRAVENOUS | Status: AC | PRN
  Administered 2015-11-23: 20 ug/kg/min via INTRAVENOUS

## 2015-11-23 MED ORDER — SODIUM CHLORIDE 0.9 % IV SOLN
1000.0000 mg | Freq: Once | INTRAVENOUS | Status: AC
  Administered 2015-11-23: 1000 mg via INTRAVENOUS
  Filled 2015-11-23: qty 10

## 2015-11-23 MED FILL — Medication: Qty: 1 | Status: AC
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
## 2015-11-23 NOTE — ED Notes (Signed)
Patient's wife at bedside.

## 2015-11-23 NOTE — ED Notes (Signed)
Helicopter 2 min ETA

## 2015-11-23 NOTE — ED Notes (Signed)
[facility] life flight arrived and at bedside.
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
## 2015-11-23 NOTE — ED Notes (Signed)
Chest xray at bedside.

## 2015-11-23 NOTE — ED Provider Notes (Signed)
LACERATION REPAIR Performed by: Earleen Newport Authorized by: Lenise Arena E Consent: Verbal consent obtained. Risks and benefits: risks, benefits and alternatives were discussed Consent given by: Emergent consent Patient identity confirmed: provided demographic data Prepped and Draped as possible Wound explored  Patient needed emergent closure of an arterial bleed from the right temporal area.  Laceration Location: Right temporal area  Laceration Length: 10 cm  No Foreign Bodies seen or palpated  Anesthesia: local infiltration  Local anesthetic: lidocaine 1 % with epinephrine  Anesthetic total: 10 ml  Irrigation method: syringe Amount of cleaning: standard  Skin closure: 3-0 Ethilon   Number of sutures: 15   Technique: Simple interrupted and continuous stitches.   Patient tolerance: Patient tolerated the procedure well with no immediate complications. Bleeding was controlled at the time of closure.   Earleen Newport, MD 11/23/15 [phone]

## 2015-11-24 DIAGNOSIS — S069XAA Unspecified intracranial injury with loss of consciousness status unknown, initial encounter: Secondary | ICD-10-CM | POA: Diagnosis present

## 2015-12-27 ENCOUNTER — Inpatient Hospital Stay (HOSPITAL_COMMUNITY)
Admission: RE | Admit: 2015-12-27 | Discharge: 2016-01-25 | DRG: 949 | Disposition: A | Discharge status: Home Health | Payer: 59 | Source: Intra-hospital | Attending: Physical Medicine & Rehabilitation | Admitting: Physical Medicine & Rehabilitation

## 2015-12-27 ENCOUNTER — Other Ambulatory Visit (HOSPITAL_COMMUNITY): Payer: Self-pay | Admitting: Physician Assistant

## 2015-12-27 DIAGNOSIS — S0101XD Laceration without foreign body of scalp, subsequent encounter: Secondary | ICD-10-CM | POA: Diagnosis not present

## 2015-12-27 DIAGNOSIS — S065X9A Traumatic subdural hemorrhage with loss of consciousness of unspecified duration, initial encounter: Secondary | ICD-10-CM

## 2015-12-27 DIAGNOSIS — N390 Urinary tract infection, site not specified: Secondary | ICD-10-CM | POA: Diagnosis present

## 2015-12-27 DIAGNOSIS — S52513A Displaced fracture of unspecified radial styloid process, initial encounter for closed fracture: Secondary | ICD-10-CM

## 2015-12-27 DIAGNOSIS — S0292XD Unspecified fracture of facial bones, subsequent encounter for fracture with routine healing: Secondary | ICD-10-CM | POA: Diagnosis not present

## 2015-12-27 DIAGNOSIS — G932 Benign intracranial hypertension: Secondary | ICD-10-CM | POA: Diagnosis present

## 2015-12-27 DIAGNOSIS — R532 Functional quadriplegia: Secondary | ICD-10-CM | POA: Diagnosis present

## 2015-12-27 DIAGNOSIS — S0230XD Fracture of orbital floor, unspecified side, subsequent encounter for fracture with routine healing: Secondary | ICD-10-CM | POA: Diagnosis not present

## 2015-12-27 DIAGNOSIS — M79609 Pain in unspecified limb: Secondary | ICD-10-CM | POA: Diagnosis not present

## 2015-12-27 DIAGNOSIS — S069X9S Unspecified intracranial injury with loss of consciousness of unspecified duration, sequela: Secondary | ICD-10-CM

## 2015-12-27 DIAGNOSIS — R1310 Dysphagia, unspecified: Secondary | ICD-10-CM

## 2015-12-27 DIAGNOSIS — R52 Pain, unspecified: Secondary | ICD-10-CM | POA: Diagnosis present

## 2015-12-27 DIAGNOSIS — Z87891 Personal history of nicotine dependence: Secondary | ICD-10-CM | POA: Diagnosis not present

## 2015-12-27 DIAGNOSIS — Z87820 Personal history of traumatic brain injury: Secondary | ICD-10-CM | POA: Diagnosis not present

## 2015-12-27 DIAGNOSIS — I10 Essential (primary) hypertension: Secondary | ICD-10-CM

## 2015-12-27 DIAGNOSIS — R451 Restlessness and agitation: Secondary | ICD-10-CM

## 2015-12-27 DIAGNOSIS — S0292XA Unspecified fracture of facial bones, initial encounter for closed fracture: Secondary | ICD-10-CM

## 2015-12-27 DIAGNOSIS — G931 Anoxic brain damage, not elsewhere classified: Secondary | ICD-10-CM | POA: Diagnosis present

## 2015-12-27 DIAGNOSIS — K5900 Constipation, unspecified: Secondary | ICD-10-CM | POA: Diagnosis present

## 2015-12-27 DIAGNOSIS — S0280XS Fracture of other specified skull and facial bones, unspecified side, sequela: Secondary | ICD-10-CM

## 2015-12-27 DIAGNOSIS — R4189 Other symptoms and signs involving cognitive functions and awareness: Secondary | ICD-10-CM | POA: Diagnosis present

## 2015-12-27 DIAGNOSIS — S52514D Nondisplaced fracture of right radial styloid process, subsequent encounter for closed fracture with routine healing: Secondary | ICD-10-CM

## 2015-12-27 DIAGNOSIS — S065X3D Traumatic subdural hemorrhage with loss of consciousness of 1 hour to 5 hours 59 minutes, subsequent encounter: Secondary | ICD-10-CM | POA: Diagnosis not present

## 2015-12-27 DIAGNOSIS — I6200 Nontraumatic subdural hemorrhage, unspecified: Secondary | ICD-10-CM

## 2015-12-27 DIAGNOSIS — R1314 Dysphagia, pharyngoesophageal phase: Secondary | ICD-10-CM | POA: Diagnosis not present

## 2015-12-27 DIAGNOSIS — S069X3A Unspecified intracranial injury with loss of consciousness of 1 hour to 5 hours 59 minutes, initial encounter: Secondary | ICD-10-CM | POA: Diagnosis present

## 2015-12-27 DIAGNOSIS — W19XXXS Unspecified fall, sequela: Secondary | ICD-10-CM | POA: Diagnosis not present

## 2015-12-27 DIAGNOSIS — S52511S Displaced fracture of right radial styloid process, sequela: Secondary | ICD-10-CM | POA: Diagnosis not present

## 2015-12-27 DIAGNOSIS — S52532S Colles' fracture of left radius, sequela: Secondary | ICD-10-CM

## 2015-12-27 DIAGNOSIS — G8918 Other acute postprocedural pain: Secondary | ICD-10-CM

## 2015-12-27 DIAGNOSIS — S069X3S Unspecified intracranial injury with loss of consciousness of 1 hour to 5 hours 59 minutes, sequela: Secondary | ICD-10-CM

## 2015-12-27 DIAGNOSIS — R1312 Dysphagia, oropharyngeal phase: Secondary | ICD-10-CM | POA: Diagnosis not present

## 2015-12-27 DIAGNOSIS — W19XXXD Unspecified fall, subsequent encounter: Secondary | ICD-10-CM | POA: Diagnosis not present

## 2015-12-27 DIAGNOSIS — B9620 Unspecified Escherichia coli [E. coli] as the cause of diseases classified elsewhere: Secondary | ICD-10-CM | POA: Diagnosis present

## 2015-12-27 DIAGNOSIS — S065XAA Traumatic subdural hemorrhage with loss of consciousness status unknown, initial encounter: Secondary | ICD-10-CM

## 2015-12-27 DIAGNOSIS — S52502D Unspecified fracture of the lower end of left radius, subsequent encounter for closed fracture with routine healing: Secondary | ICD-10-CM

## 2015-12-27 DIAGNOSIS — R339 Retention of urine, unspecified: Secondary | ICD-10-CM

## 2015-12-27 DIAGNOSIS — S52516S Nondisplaced fracture of unspecified radial styloid process, sequela: Secondary | ICD-10-CM | POA: Diagnosis not present

## 2015-12-27 DIAGNOSIS — W19XXXA Unspecified fall, initial encounter: Secondary | ICD-10-CM

## 2015-12-27 DIAGNOSIS — S52352S Displaced comminuted fracture of shaft of radius, left arm, sequela: Secondary | ICD-10-CM | POA: Diagnosis not present

## 2015-12-27 DIAGNOSIS — R1313 Dysphagia, pharyngeal phase: Secondary | ICD-10-CM | POA: Diagnosis not present

## 2015-12-27 DIAGNOSIS — S069XAA Unspecified intracranial injury with loss of consciousness status unknown, initial encounter: Secondary | ICD-10-CM | POA: Diagnosis present

## 2015-12-27 DIAGNOSIS — S52532A Colles' fracture of left radius, initial encounter for closed fracture: Secondary | ICD-10-CM

## 2015-12-27 DIAGNOSIS — W12XXXD Fall on and from scaffolding, subsequent encounter: Secondary | ICD-10-CM | POA: Diagnosis present

## 2015-12-27 DIAGNOSIS — S069X3D Unspecified intracranial injury with loss of consciousness of 1 hour to 5 hours 59 minutes, subsequent encounter: Secondary | ICD-10-CM | POA: Diagnosis not present

## 2015-12-27 DIAGNOSIS — S52514A Nondisplaced fracture of right radial styloid process, initial encounter for closed fracture: Secondary | ICD-10-CM

## 2015-12-27 DIAGNOSIS — S52513S Displaced fracture of unspecified radial styloid process, sequela: Secondary | ICD-10-CM | POA: Diagnosis not present

## 2015-12-27 DIAGNOSIS — S52352A Displaced comminuted fracture of shaft of radius, left arm, initial encounter for closed fracture: Secondary | ICD-10-CM

## 2015-12-27 DIAGNOSIS — K5901 Slow transit constipation: Secondary | ICD-10-CM

## 2015-12-27 LAB — URINE MICROSCOPIC-ADD ON
RBC / HPF: NONE SEEN RBC/hpf (ref 0–5)
Squamous Epithelial / LPF: NONE SEEN

## 2015-12-27 LAB — CBC
HCT: 32.5 % — ABNORMAL LOW (ref 39.0–52.0)
Hemoglobin: 10.4 g/dL — ABNORMAL LOW (ref 13.0–17.0)
MCH: 28.9 pg (ref 26.0–34.0)
MCHC: 32 g/dL (ref 30.0–36.0)
MCV: 90.3 fL (ref 78.0–100.0)
Platelets: 348 K/uL (ref 150–400)
RBC: 3.6 MIL/uL — ABNORMAL LOW (ref 4.22–5.81)
RDW: 12.6 % (ref 11.5–15.5)
WBC: 8 K/uL (ref 4.0–10.5)

## 2015-12-27 LAB — URINALYSIS, ROUTINE W REFLEX MICROSCOPIC
BILIRUBIN URINE: NEGATIVE
GLUCOSE, UA: NEGATIVE mg/dL
KETONES UR: NEGATIVE mg/dL
NITRITE: POSITIVE — AB
PH: 5.5 (ref 5.0–8.0)
Protein, ur: NEGATIVE mg/dL
Specific Gravity, Urine: 1.026 (ref 1.005–1.030)

## 2015-12-27 LAB — MRSA PCR SCREENING: MRSA by PCR: NEGATIVE

## 2015-12-27 LAB — CREATININE, SERUM
CREATININE: 0.69 mg/dL (ref 0.61–1.24)
GFR calc Af Amer: >60 mL/min (ref >60)

## 2015-12-27 MED ORDER — POLYVINYL ALCOHOL 1.4 % OP SOLN
1.0000 [drp] | OPHTHALMIC | Status: DC | PRN
  Filled 2015-12-27: qty 15

## 2015-12-27 MED ORDER — RESOURCE THICKENUP CLEAR PO POWD
ORAL | Status: DC | PRN
  Filled 2015-12-27 (×3): qty 125

## 2015-12-27 MED ORDER — ONDANSETRON HCL 4 MG PO TABS: 4.0000 mg | ORAL_TABLET | Freq: Four times a day (QID) | ORAL | Status: DC | PRN

## 2015-12-27 MED ORDER — TAMSULOSIN HCL 0.4 MG PO CAPS
0.8000 mg | ORAL_CAPSULE | Freq: Every day | ORAL | Status: DC
  Administered 2015-12-27 – 2016-01-17 (×21): 0.8 mg via ORAL
  Filled 2015-12-27 (×22): qty 2

## 2015-12-27 MED ORDER — ENOXAPARIN SODIUM 40 MG/0.4ML ~~LOC~~ SOLN
40.0000 mg | SUBCUTANEOUS | Status: DC
  Administered 2015-12-27 – 2016-01-18 (×22): 40 mg via SUBCUTANEOUS
  Filled 2015-12-27 (×23): qty 0.4

## 2015-12-27 MED ORDER — POLYETHYLENE GLYCOL 3350 17 G PO PACK
17.0000 g | PACK | Freq: Every day | ORAL | Status: DC
  Administered 2015-12-28 – 2016-01-10 (×10): 17 g via ORAL
  Filled 2015-12-27 (×13): qty 1

## 2015-12-27 MED ORDER — FAMOTIDINE 20 MG PO TABS
20.0000 mg | ORAL_TABLET | Freq: Two times a day (BID) | ORAL | Status: DC
  Administered 2015-12-27 – 2016-01-25 (×58): 20 mg via ORAL
  Filled 2015-12-27 (×59): qty 1

## 2015-12-27 MED ORDER — STARCH (THICKENING) PO POWD: ORAL | Status: DC | PRN

## 2015-12-27 MED ORDER — SORBITOL 70 % SOLN
30.0000 mL | Freq: Every day | Status: DC | PRN
  Administered 2016-01-20 – 2016-01-23 (×2): 30 mL via ORAL
  Filled 2015-12-27 (×2): qty 30

## 2015-12-27 MED ORDER — CARVEDILOL 12.5 MG PO TABS
12.5000 mg | ORAL_TABLET | Freq: Two times a day (BID) | ORAL | Status: DC
  Administered 2015-12-27 – 2016-01-25 (×56): 12.5 mg via ORAL
  Filled 2015-12-27 (×58): qty 1

## 2015-12-27 MED ORDER — QUETIAPINE FUMARATE 25 MG PO TABS
12.5000 mg | ORAL_TABLET | Freq: Every day | ORAL | Status: DC
  Administered 2015-12-27 – 2016-01-24 (×29): 12.5 mg via ORAL
  Filled 2015-12-27 (×29): qty 1

## 2015-12-27 MED ORDER — DOCUSATE SODIUM 100 MG PO CAPS
100.0000 mg | ORAL_CAPSULE | Freq: Two times a day (BID) | ORAL | Status: DC
  Administered 2015-12-27 – 2016-01-04 (×14): 100 mg via ORAL
  Filled 2015-12-27 (×17): qty 1

## 2015-12-27 MED ORDER — AMLODIPINE BESYLATE 10 MG PO TABS
10.0000 mg | ORAL_TABLET | Freq: Every day | ORAL | Status: DC
  Administered 2015-12-28 – 2016-01-25 (×29): 10 mg via ORAL
  Filled 2015-12-27 (×29): qty 1

## 2015-12-27 MED ORDER — OXYCODONE HCL 5 MG PO TABS: 10.0000 mg | ORAL_TABLET | ORAL | Status: DC | PRN

## 2015-12-27 MED ORDER — SODIUM CHLORIDE 0.45 % IV SOLN
INTRAVENOUS | Status: DC
  Administered 2015-12-27: 18:00:00 via INTRAVENOUS
  Administered 2015-12-31: 75 mL/h via INTRAVENOUS
  Administered 2016-01-01: 18:00:00 via INTRAVENOUS
  Administered 2016-01-04: 75 mL/h via INTRAVENOUS

## 2015-12-27 MED ORDER — ONDANSETRON HCL 4 MG/2ML IJ SOLN: 4.0000 mg | Freq: Four times a day (QID) | INTRAMUSCULAR | Status: DC | PRN

## 2015-12-27 NOTE — Progress Notes (Signed)
Ankit Lorie Phenix, MD Physician Signed Physical Medicine and Rehabilitation  Progress Notes Date of Service: 12/27/2015 10:17 AM  Related encounter: Documentation from 12/27/2015 in [facility]       [] Hide copied text Secondary Market PMR Admission Coordinator Pre-Admission Assessment  Patient:Justin R Purcellis an 55 y.o., male MRN:[id number] DOB:[birth year] Height:6\' 1"  (185.4 cm) Weight:185 lb  Insurance Information HMO: PPO: PCP: IPA:80/20: OTHER: UHC Choice Plus PRIMARY: UHCPolicy#: [id number] Subscriber: self CM Name: Yvette Rack, with follow up to be done by Sharlett Iles via EMR accessPhone#: [phone]#: [phone] Pre-Cert#: [id number] Employer: self employed Benefits: Phone #: [phone]: Luz/Nadine Eff. Date: 8/1/17Deduct: $3000Out of Pocket Max: $5000Life Max: n/a CIR: 80%/20%SNF: 80%/20% Outpatient:80%Co-Pay: 20% Home Health: 80%Co-Pay: 20% DME: 80%Co-Pay: 20% Providers: In network SECONDARY: Policy#: Subscriber:  CM Name: Phone#: Fax#:  Pre-Cert#: Employer:  Benefits: Phone #: Name:  Eff. Date: Deduct: Out of Pocket Max: Life Max:  CIR: SNF:  Outpatient:Co-Pay:  Home Health: Co-Pay:  DME: Co-Pay:   Medicaid Application Date:Case Manager: Disability Application Date:Case Worker:  Emergency Tax adviser Information   Name Relation Home Work Mobile   Laser,Noelle Spouse [phone]  [phone]   Huber,Kristyne Daughter   [phone]      Current Medical History Patient Admitting Diagnosis:TBI, multiple fractures History of Present Illness: Pt. Is a 55 year old right handed male with history of hypertension and substance abuse admitted to [facility] 11/23/2015 after  a fall 30 feet from scaffolding with aquestion loss of consciousness. Per chart review patient is married lives in [place]. He is self-employed Chief Strategy Officer. Pt's wifeplans FMLA. One level home. Daughter lives in [place], [place] a Marine scientist. Full report of incident unavailable. Cranial CT scan and imaging demonstrated multiple skull base, frontal sinus, and orbital fractures on the right. He also had a right frontal contusion and associated subdural without midline shift. Complex closure right scalp laceration and left scalp incision. Neurosurgery consulted to advise conservative care. He remained in the ICU for 13 days. Patient with intracranial hypertension, cerebral hypoxia and functional quadriparesis that steadily improved. Patient sustained a nondisplaced right radial styloid fracture as well as comminuted distal left radius fracturestatus post closed reduction. Nonweightbearing bilateral upper extremities. Hospital course tracheostomy and gastrostomy tube placement 12/03/2015. Decannulated 12/21/2015. Underwent ORIF 12/08/2015 for multiple facial fractures with reconstruction. Hospital course pain management. His diet has been advanced to a dysphagia #1 honey thick liquid. Lovenox for DVT prophylaxis later initiated. Bouts of urinary retention with Flomax added. Patient with ongoing bouts of agitation and poor insight to deficits. Pt. For IP rehab admission for ongoing medical management and therapies for eventual discharge home with wife.   Review of Systems  Patient's medical record from [facility] has been reviewed by the rehabilitation admission coordinator and physician. NIH Stroke scale: Glascow Coma Scale:  Past Medical History     Past Medical History:  Diagnosis Date  . Hypertension     Family History family history includes Anxiety disorder in his mother; Thyroid disease in his mother.  Prior Rehab/Hospitalizations Has the patient had major surgery during  100 days prior to admission? No   Current Medications See MAR  Patients Current Diet: Dysphagia 1 diet with honey thick liquids; strict aspiration precautions, upright positioning, small bites and sips  Precautions / Restrictions Precautions Precautions: Fall (aspiration precautions, ) Precautions/Special Needs: Behavior Precaution Comments: pt. impulsive and agitated at times Restrictions Weight Bearing Restrictions: Yes RUE Weight Bearing: Non weight bearing LUE Weight Bearing: Non weight  bearing Other Position/Activity Restrictions: pt. 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 Independent  Indoor Mobility: Did the patient need assistance with walking from room to room (with or without device)? Independent  Stairs: Did the patient need assistance with internal or external stairs (with or without device)? Independent  Functional Cognition: Did the patient need help planning regular tasks such as shopping or remembering to take medications? Independent  Home Assistive Devices / Equipment Home Assistive Devices/Equipment: None  Prior Device Use: Indicate devices/aids used by the patient prior to current illness, exacerbation or injury? None of the above   Prior Functional Level Current Functional Level  Bed Mobility Independent Max assist  Transfers Independent (+2 mod assist with (B) knees blocked, min clearance from bed)  Mobility - Walk/Wheelchair Independent Other (currently unable)  Upper Body Dressing Independent Other (not addressed)    Lower Body Dressing Independent Other (not addressed)  Grooming Independent Other (not addressed)  Eating/Drinking Independent Other (not addressed)  Toilet Transfer Independent Other (not yet addressed)  Bladder Continence  continent, though wife states slow to start stream incontinent, foley removed 12/25/15 per wife; condom cath trial, pt. removed  Bowel Management continent incontinent  Stair Climbing  (unable)  Communication WNL ongoing deficits  Memory WNL ongoing deficits  Cooking/Meal Prep wife does most of the cooking, however pt. can prepare simple meals   Housework shared   Money Management shared   Driving      Special needs/care consideration BiPAP/CPAP no CPM no Continuous Drip IV no Dialysis no Life Vest no Oxygen no Special Bed no Trach Size pt.now decannulated, has healing trach site wound Wound Vac  Skin Wife reports scabs on right hand (knuckles) Bowel mgmt: Incontinent, last BM 12/26/15 per wife Bladder mgmt: incontinent, foley removed, trial of condom cath Diabetic mgmt no  Previous Home Environment Living Arrangements: Spouse/significant other Lives With: Spouse Available Help at Discharge: Family, Available 24 hours/day, Other (Comment) (wife will take FMLA / work from home, daughter ,RN,to assist) Type of Home: House Home Layout: One level Home Access: Stairs to enter [employer] of Steps: 1 (at front entrance) Bathroom Shower/Tub: Walk-in shower, Other (comment) (corner walk in shower) Bathroom Toilet: Handicapped height Bathroom Accessibility: Yes How Accessible: Accessible via walker [place]: No Additional Comments: Pt's daughter is an Therapist, sports who resides in [place]. Pt and wife live in [place]. Family prefers CIR in [place] so daughter can be actively involved in her dad's care  Discharge Living Setting Plans for Discharge Living Setting: Patient's  home Type of Home at Discharge: House Discharge Home Layout: One level Discharge Home Access: Stairs to enter Entrance Stairs-Number of Steps: 1 Discharge Bathroom Shower/Tub: Walk-in shower, Other (comment) (corner walk in shower) Discharge Bathroom Toilet: Handicapped height Discharge Bathroom Accessibility: Yes How Accessible: Accessible via walker Does the patient have any problems obtaining your medications?: No  Social/Family/Support Systems Patient Roles: Spouse, Parent (pt. has 2 daughters) Anticipated Caregiver: Pt's wife , Ricahrd Nicoloff and his daughter Rockne Coons will be the primary caregivers Anticipated Caregiver's Contact Information: Bismark Steveson, [phone]; daughter [name] [phone] Ability/Limitations  of Caregiver: Pt's wife, Noell states she will take FMLA as needed to care for her husband but hopes to work from home if possible. Daughter Leona Carry is a Environmental consultant and works several days per week. They plan to stagger their work schedules in order to care for the paitent Caregiver Availability: 24/7 Discharge Plan Discussed with Primary Caregiver: Yes Is Caregiver In Agreement with Plan?: Yes Does Caregiver/Family have Issues with Lodging/Transportation while Pt is in Rehab?: No  Goals/Additional Needs Patient/Family Goal for Rehab: min and mod assist PT/OT/SLP Expected length of stay: 25-28 days Cultural Considerations: "Christian" Dietary Needs: dysphagia 1 (puree) solid and honey thick liquids with strict aspiration precautions, upright positioning , small bites and sips Equipment Needs: TBA Additional Information: I have spoken frankly with the wife and daughter about anticipated residual deficits and care needs at the time of DC from IP rehab. They maintain their committment to care for the patient despite these anticipated challenges Pt/Family Agrees to Admission and willing to participate: Yes Program Orientation Provided &Reviewed  with Pt/Caregiver Including Roles &Responsibilities: Yes  Patient Condition:Pt. Was admitted to [facility] on 11/23/15 with severe TBI sustained in a fall from a 30+ foot scaffolding. Pt. Has been receiving PT/OT/SLP services and are recommending ongoing therapies in the inpatient rehab setting. Pt. can benefit from and tolerate 4 1/2 hours of therapies in preparing him for discharge home under the care of his wife. The above assessment and information have been reviewed with Dr. Posey Pronto. I have approval from Dr. Posey Pronto for acute inpatient rehabilitation admission today.   Preadmission Screen Completed By: [name], 9/25/201711:33 AM ______________________________________________________________________  Discussed status with Dr. Steele Sizer 9/25/17at 1250 and received telephone approval for admission today.  Admission Coordinator: [name], time [id number] /Date 12/27/15  Assessment/Plan: Diagnosis: TBI, multiple fractures  1. Does the need for close, 24 hr/day Medical supervision in concert with the patient's rehab needs make it unreasonable for this patient to be served in a less intensive setting? Yes 2. Co-Morbidities requiring supervision/potential complications: hypertension and substance abuse, cognitive deficits 3. Due to bladder management, bowel management, safety, skin/wound care, disease management, medication administration and patient education, does the patient require 24 hr/day rehab nursing? Yes 4. Does the patient require coordinated care of a physician, rehab nurse, PT (1-2 hrs/day, 5 days/week), OT (1-2 hrs/day, 5 5.  days/week) and SLP (1-2 hrs/day, 5 days/week)to address physical and functional deficits in the context of the above medical diagnosis(es)? Yes Addressing deficits in the following areas: balance, endurance, locomotion, strength, transferring, bowel/bladder control, bathing, dressing, feeding, grooming, toileting, cognition, speech,  language, swallowing and psychosocial support 6. Can the patient actively participate in an intensive therapy program of at least 3 hrs of therapy 5 days a week? Yes 7. The potential for patient to make measurable gains while on inpatient rehab is good 8. Anticipated functional outcomes upon discharge from inpatients are: min assist and mod assistPT, min assist and mod assistOT, supervision and min assistSLP 9. Estimated rehab length of stay to reach the above functional goals is: 20-24 days. 10. Does the patient have adequate social supports to accommodate these discharge functional goals? Yes 11. Anticipated D/C setting: Home 12. Anticipated post D/C treatments: HH therapy and Home excercise program 13. Overall Rehab/Functional Prognosis: good    RECOMMENDATIONS: This patient's condition is appropriate for continued rehabilitative care in the following setting: CIR Patient has agreed to participate in recommended program. Potentially Note that insurance prior authorization may be required for reimbursement for  recommended care.  [name] 12/27/2015 Delice Lesch, MD, Mellody Drown 12/27/15

## 2015-12-27 NOTE — H&P (Signed)
Physical Medicine and Rehabilitation Admission H&P   HPI: 55 year old right handed male with history of hypertension and substance abuse. Admitted to [facility] 11/23/2015 after a fall 30 feet from scaffolding question loss of consciousness. Per chart review patient is married lives in [place]. 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 One level home. Daughter in [place] as a Marine scientist. Full report of incident unavailable. Cranial CT scan and imaging demonstrated multiple skull base, frontal sinus, and orbital fractures on the right. He also had a right frontal contusion and associated subdural without midline shift. Complex closure right scalp laceration and left scalp incision. Neurosurgery consulted to advise conservative care. He remained in the ICU for 13 days. Patient with intracranial hypertension, cerebral hypoxia and functional quadriparesis that steadily improved. Patient sustained a nondisplaced right radial styloid fracture as well as comminuted distal left radius fracture status post closed reduction. Nonweightbearing bilateral upper extremities. Hospital course tracheostomy and gastrostomy tube placement 12/03/2015. Decannulated 12/21/2015. Underwent ORIF 12/08/2015 for multiple facial fractures with reconstruction. Hospital course pain management. His diet has been advanced to a dysphagia #1 honey thick liquid. Subcutaneous Lovenox for DVT prophylaxis later initiated. Bouts of urinary retention with Flomax added. Patient with ongoing bouts of agitation and poor insight to deficits. Patient was admitted for a comprehensive rehabilitation program  Review of Systems  Unable to perform ROS: Mental acuity       Past Medical History:  Diagnosis Date  . Hypertension         Past Surgical History:  Procedure Laterality Date  . COLONOSCOPY WITH PROPOFOL N/A 10/12/2015   Procedure: COLONOSCOPY WITH PROPOFOL;  Surgeon: Lucilla Lame, MD;  Location: [facility]  ENDOSCOPY;  Service: Endoscopy;  Laterality: N/A;  . NO PAST SURGERIES          Family History  Problem Relation Age of Onset  . Anxiety disorder Mother   . Thyroid disease Mother    Social History:  reports that he quit smoking about 7 years ago. His smoking use included Cigarettes. He has a 52.50 pack-year smoking history. He has never used smokeless tobacco. He reports that he does not drink alcohol or use drugs. Allergies: No Known Allergies  (Not in a hospital admission)  Home:  Stanton Kidney lives with wife in [place]. 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   Functional History:  independent prior to admission  Functional Status:  Mobility: Min mod assist to ambulate 20 feet. Nonweightbearing left upper and right upper extremity    moderate assist sit to supine      ADL: Mod max assist ADLs    Cognition:  decreased safety awareness and impulsive. Poor insight to deficits    Physical Exam: 108/60. Pulse 80 respirations 18 temperature 98 Physical Exam  Constitutional: He appears well-developed. NAD. Eyes: Right eye ptosis.  Conj are normal.  Neck: Normal range of motion. Neck supple.  Trach site healing, small scab. Cardiovascular: Normal rate and regular rhythm.   Respiratory: Effort normal and breath sounds normal. No respiratory distress.  GI: Soft. Bowel sounds are normal. He exhibits no distension.  PEG tube in place  Neurological: He is somnolent.  Patient is impulsive decreased safety awareness.  Poor insight to deficits.  Does not follow commands, mouths "tired". Unable to assess motor. Sensation appears to be intact to light touch throughout. DTRs 3+ LLE. Skin:  Left splint upper extremity and short cast RUE  Psych: Unable to assess to due cognition  Medical Problem List and Plan: 1.  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 Status  post ORIF of multiple facial fractures 12/08/2015 2.  DVT Prophylaxis/Anticoagulation: Subcutaneous Lovenox. Check vascular study 3. Pain Management: Oxycodone as needed 4. Mood: Seroquel 12.5 mg twice a day, will wean as tolerated 5. Neuropsych: This patient is not capable of making decisions on his own behalf. 6. Skin/Wound Care: Routine skin checks 7. Fluids/Electrolytes/Nutrition: Routine I&O with follow-up chemistries 8.Tracheostomy. Decannulated. Monitor for healing. 9. Dysphagia. Gastrostomy tube 12/03/2015. Currently on a dysphagia #1 honey thick liquid diet. 9. Hypertension. Norvasc 10 mg daily, Coreg 12.5 mg twice a day. Monitor with increased mobility 10. Nondisplaced right radial styloid fracture. Nonweightbearing 11. Comminuted distal left radius fracture Status post closed reduction.. Nonweightbearing 12.. Urinary retention. Flomax 0.8 mg daily. Check PVR 3 13. Constipation. Laxative assistance   Post Admission Physician Evaluation: 1. Functional deficits secondary  to TBI/SDH. 2. Patient is admitted to receive collaborative, interdisciplinary care between the physiatrist, rehab nursing staff, and therapy team. 3. Patient's level of medical complexity and substantial therapy needs in context of that medical necessity cannot be provided at a lesser intensity of care such as a SNF. 4. Patient has experienced substantial functional loss from his/her baseline which was documented above under the "Functional History" and "Functional Status" headings.  Judging by the patient's diagnosis, physical exam, and functional history, the patient has potential for functional progress which will result in measurable gains while on inpatient rehab.  These gains will be of substantial and practical use upon discharge  in facilitating mobility and self-care at the household level. 5. Physiatrist will provide 24 hour management of medical needs as well as oversight of the therapy plan/treatment and  provide guidance as appropriate regarding the interaction of the two. 6. 24 hour rehab nursing will assist with bladder management, bowel management, safety, skin/wound care, disease management, medication administration, pain management and patient education  and help integrate therapy concepts, techniques,education, etc. 7. PT will assess and treat for/with: Lower extremity strength, range of motion, stamina, balance, functional mobility, safety, adaptive techniques and equipment, coping skills, pain control, TBI education.   Goals are: Mod/Min A. 8. OT will assess and treat for/with: ADL's, functional mobility, safety, upper extremity strength, adaptive techniques and equipment, ego support, and community reintegration.   Goals are: Mod/Min A. Therapy may proceed with showering this patient. 9. SLP will assess and treat for/with: speech, language, swallowing cognition.  Goals are: Mod/Min A. 10. Case Management and Social Worker will assess and treat for psychological issues and discharge planning. 11. Team conference will be held weekly to assess progress toward goals and to determine barriers to discharge. 12. Patient will receive at least 3 hours of therapy per day at least 5 days per week. 13. ELOS: 20-25 days.       14. Prognosis:  good   Delice Lesch, MD, Mellody Drown 12/27/2015
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
## 2015-12-28 ENCOUNTER — Inpatient Hospital Stay (HOSPITAL_COMMUNITY): Payer: 59 | Admitting: Occupational Therapy

## 2015-12-28 ENCOUNTER — Ambulatory Visit (HOSPITAL_COMMUNITY): Payer: 59 | Attending: Physician Assistant

## 2015-12-28 ENCOUNTER — Inpatient Hospital Stay (HOSPITAL_COMMUNITY): Payer: 59 | Admitting: Physical Therapy

## 2015-12-28 ENCOUNTER — Inpatient Hospital Stay (HOSPITAL_COMMUNITY): Payer: 59 | Admitting: Speech Pathology

## 2015-12-28 DIAGNOSIS — R52 Pain, unspecified: Secondary | ICD-10-CM | POA: Diagnosis not present

## 2015-12-28 DIAGNOSIS — M79609 Pain in unspecified limb: Secondary | ICD-10-CM | POA: Diagnosis not present

## 2015-12-28 DIAGNOSIS — S069X3S Unspecified intracranial injury with loss of consciousness of 1 hour to 5 hours 59 minutes, sequela: Secondary | ICD-10-CM

## 2015-12-28 LAB — CBC WITH DIFFERENTIAL/PLATELET
BASOS ABS: 0 10*3/uL (ref 0.0–0.1)
Basophils Relative: 0 %
Eosinophils Absolute: 0.3 10*3/uL (ref 0.0–0.7)
Eosinophils Relative: 3 %
HEMATOCRIT: 34.4 % — AB (ref 39.0–52.0)
HEMOGLOBIN: 11.1 g/dL — AB (ref 13.0–17.0)
LYMPHS ABS: 2 10*3/uL (ref 0.7–4.0)
LYMPHS PCT: 24 %
MCH: 29.1 pg (ref 26.0–34.0)
MCHC: 32.3 g/dL (ref 30.0–36.0)
MCV: 90.1 fL (ref 78.0–100.0)
Monocytes Absolute: 0.6 10*3/uL (ref 0.1–1.0)
Monocytes Relative: 7 %
NEUTROS ABS: 5.5 10*3/uL (ref 1.7–7.7)
Neutrophils Relative %: 66 %
Platelets: 345 10*3/uL (ref 150–400)
RBC: 3.82 MIL/uL — AB (ref 4.22–5.81)
RDW: 12.6 % (ref 11.5–15.5)
WBC: 8.4 10*3/uL (ref 4.0–10.5)

## 2015-12-28 LAB — COMPREHENSIVE METABOLIC PANEL
ALK PHOS: 139 U/L — AB (ref 38–126)
ALT: 43 U/L (ref 17–63)
AST: 23 U/L (ref 15–41)
Albumin: 2.9 g/dL — ABNORMAL LOW (ref 3.5–5.0)
Anion gap: 7 (ref 5–15)
BILIRUBIN TOTAL: 0.4 mg/dL (ref 0.3–1.2)
BUN: 13 mg/dL (ref 6–20)
CALCIUM: 9.2 mg/dL (ref 8.9–10.3)
CHLORIDE: 103 mmol/L (ref 101–111)
CO2: 25 mmol/L (ref 22–32)
CREATININE: 0.67 mg/dL (ref 0.61–1.24)
GFR calc Af Amer: >60 mL/min (ref >60)
Glucose, Bld: 115 mg/dL — ABNORMAL HIGH (ref 65–99)
Potassium: 3.8 mmol/L (ref 3.5–5.1)
Sodium: 135 mmol/L (ref 135–145)
TOTAL PROTEIN: 6.6 g/dL (ref 6.5–8.1)

## 2015-12-28 MED ORDER — ENSURE ENLIVE PO LIQD
237.0000 mL | Freq: Three times a day (TID) | ORAL | Status: DC
  Administered 2015-12-28 – 2015-12-30 (×4): 237 mL

## 2015-12-28 MED ORDER — TRAMADOL HCL 50 MG PO TABS
50.0000 mg | ORAL_TABLET | Freq: Four times a day (QID) | ORAL | Status: DC | PRN
  Administered 2015-12-30 – 2016-01-24 (×15): 50 mg via ORAL
  Filled 2015-12-28 (×16): qty 1

## 2015-12-28 MED ORDER — ACETAMINOPHEN 325 MG PO TABS
650.0000 mg | ORAL_TABLET | Freq: Four times a day (QID) | ORAL | Status: DC | PRN
  Administered 2016-01-03 – 2016-01-16 (×13): 650 mg via ORAL
  Filled 2015-12-28 (×12): qty 2

## 2015-12-28 NOTE — Progress Notes (Signed)
VASCULAR LAB PRELIMINARY  PRELIMINARY  PRELIMINARY  PRELIMINARY  Bilateral lower extremity venous duplex completed.    Preliminary report:  Bilateral:  No evidence of DVT, superficial thrombosis, or Baker's Cyst.   Mischa Pollard, RVS 12/28/2015, 12:14 PM
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
## 2015-12-28 NOTE — Progress Notes (Signed)
Physical Therapy Session Note  Patient Details  Name: Justin Fox MRN: [id number] Date of Birth: 01/09/1961  Today's Date: 12/28/2015 PT Individual Time: [id number] PT Individual Time Calculation (min): 57 min    Short Term Goals: Week 1:  PT Short Term Goal 1 (Week 1): pt will sustain attention to functional task x 30 seconds with supervision PT Short Term Goal 2 (Week 1): Pt will perform functional transfers consistently with mod A PT Short Term Goal 3 (Week 1): Pt will gait in controlled environment x 25' with mod A  Skilled Therapeutic Interventions/Progress Updates:    Pt received in bed & agreeable to treatment with daughter Molli Hazard) present for session. Pt reported need to use restroom; ambulated bed>bathroom without AD & Mod assist overall 2/2 inconsistent gait pattern & impaired safety awareness. Pt with continent void & BM with total assist for peri hygiene. Pt participated in sorting game (red cards vs black cards) with max cuing to attend to task & max cuing for instructions. Pt able to attend to task for 1 minute maximum at a time. Pt able to attend to looking through pictures and correctly recalled 50% of family/friends in pictures. Pt perseverative on "sitting down" or "going back to bed" throughout session requiring maximum multimodal cuing to initiate task (ambulate to bathroom, sit<>stand transfers). At end of session pt left in bed with alarm set & daughter present. Pt demonstrates absent safety awareness and extreme impulsiveness throughout session.   Pt's daughter assisted with thickening pt's drink & pt required max cuing for small sips throughout session.   Therapy Documentation Precautions:  Precautions Precautions: Fall Restrictions Weight Bearing Restrictions: Yes RUE Weight Bearing: Non weight bearing LUE Weight Bearing: Non weight bearing Other Position/Activity Restrictions: pt. has cast on left forearm, splint on right forearm   See Function Navigator  for Current Functional Status.   Therapy/Group: Individual Therapy  [name] 12/28/2015, 5:23 PM

## 2015-12-28 NOTE — Progress Notes (Signed)
Orthopedic Tech Progress Note Patient Details:  Justin Fox 1960/09/11 [id number]  Ortho Devices Type of Ortho Device: Thumb velcro splint Ortho Device/Splint Location: RUE Ortho Device/Splint Interventions: Ordered, Application   Braulio Bosch 12/28/2015, 4:17 PM
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
## 2015-12-29 ENCOUNTER — Inpatient Hospital Stay (HOSPITAL_COMMUNITY): Payer: 59

## 2015-12-29 ENCOUNTER — Inpatient Hospital Stay (HOSPITAL_COMMUNITY): Payer: 59 | Admitting: Physical Therapy

## 2015-12-29 ENCOUNTER — Inpatient Hospital Stay (HOSPITAL_COMMUNITY): Payer: 59 | Admitting: Occupational Therapy

## 2015-12-29 ENCOUNTER — Inpatient Hospital Stay (HOSPITAL_COMMUNITY): Payer: 59 | Admitting: Speech Pathology

## 2015-12-29 DIAGNOSIS — S52513S Displaced fracture of unspecified radial styloid process, sequela: Secondary | ICD-10-CM

## 2015-12-29 LAB — URINE CULTURE: Culture: >=100000 — AB

## 2015-12-29 MED ORDER — CEPHALEXIN 500 MG PO CAPS
500.0000 mg | ORAL_CAPSULE | Freq: Two times a day (BID) | ORAL | Status: DC
  Administered 2015-12-29 – 2016-01-06 (×16): 500 mg via ORAL
  Filled 2015-12-29 (×2): qty 1
  Filled 2015-12-29 (×3): qty 2
  Filled 2015-12-29: qty 1
  Filled 2015-12-29 (×2): qty 2
  Filled 2015-12-29 (×3): qty 1
  Filled 2015-12-29 (×2): qty 2
  Filled 2015-12-29 (×2): qty 1
  Filled 2015-12-29: qty 2
  Filled 2015-12-29 (×2): qty 1
  Filled 2015-12-29: qty 2
  Filled 2015-12-29: qty 1
  Filled 2015-12-29: qty 2
  Filled 2015-12-29: qty 1
  Filled 2015-12-29: qty 2
  Filled 2015-12-29: qty 1
  Filled 2015-12-29 (×4): qty 2
  Filled 2015-12-29: qty 1
  Filled 2015-12-29: qty 2
  Filled 2015-12-29 (×2): qty 1
  Filled 2015-12-29 (×2): qty 2

## 2015-12-29 MED ORDER — AMOXICILLIN 250 MG PO CAPS
250.0000 mg | ORAL_CAPSULE | Freq: Three times a day (TID) | ORAL | Status: DC
  Administered 2015-12-29 (×2): 250 mg via ORAL
  Filled 2015-12-29 (×2): qty 1

## 2015-12-29 NOTE — Progress Notes (Signed)
Speech Language Pathology Daily Session Note  Patient Details  Name: Justin Fox MRN: [id number] Date of Birth: 01-01-1961  Today's Date: 12/29/2015 SLP Individual Time: 1000-1100 SLP Individual Time Calculation (min): 60 min   Short Term Goals: Week 1: SLP Short Term Goal 1 (Week 1): Patient will orient to place and situation with Max A question and visual cues.  SLP Short Term Goal 2 (Week 1): Patient will demonstrate sustained attention to functional tasks for ~1 minute with Max A verbal cues for redirection.  SLP Short Term Goal 3 (Week 1): Patient will follow 1 step commands in 75% of opportunities with Mod A verbal cues.  SLP Short Term Goal 4 (Week 1): Patient will identify 1 cognitive and 1 physical deficit with Max A multimodal cues. SLP Short Term Goal 5 (Week 1): Patient will consume trials of upgraded liquids without overt s/s of aspiration over 2 consecutive sessions prior to repeat MBS.  SLP Short Term Goal 6 (Week 1): Patient will consume current diet with minimal overt s/s of aspiration with Mod A verbal cues for use of swallowing compensatory strategies.   Skilled Therapeutic Interventions: Skilled therapy intervention focused on cognitive and dysphagia goals. Patient was disoriented x3 and required Max A verbal, visual, and tactile cues to focus selective attention on self-feeding in a minimally distracting environment for ~ 20 seconds. Patient required Total A for self-feeding of nectar-thick liquids via teaspoon and hand-over-hand tactile assistance for dysphagia I textures. Patient given Max A verbal cues to check for oral residue. Patient consumed current diet without overt s/s of aspiration. Patient's conversation was tangential and preservative on using the bathroom and counting off numbers. Patient left upright in wheelchair and handed off to OT with all needs within reach. Continue current plan of care.    Function:  Cognition Comprehension Comprehension assist  level: Understands basic 25 - 49% of the time/ requires cueing 50 - 75% of the time  Expression   Expression assist level: Expresses basic 25 - 49% of the time/requires cueing 50 - 75% of the time. Uses single words/gestures.  Social Interaction Social Interaction assist level: Interacts appropriately 25 - 49% of time - Needs frequent redirection.  Problem Solving Problem solving assist level: Solves basic less than 25% of the time - needs direction nearly all the time or does not effectively solve problems and may need a restraint for safety  Memory Memory assist level: Recognizes or recalls less than 25% of the time/requires cueing greater than 75% of the time    Pain Pain Assessment Pain Assessment: No/denies pain  Therapy/Group: Individual Therapy  [name] 12/29/2015, 4:03 PM

## 2015-12-29 NOTE — Progress Notes (Signed)
Social Work Patient ID: Justin Fox, male   DOB: 05-09-1960, 55 y.o.   MRN: [id number]   CSW met with pt and dtr and then later his wife to update them on team conference discussion and targeted d/c date of 01-18-16.  They are pleased with this, as long as pt makes the expected progress by that time.  They will participate in family education closer to d/c and will be prepared to have pt at home with 24/7 supervision.  CSW will continue to follow and assist as needed.

## 2015-12-29 NOTE — Progress Notes (Signed)
Occupational Therapy Session Note  Patient Details  Name: CURLEE ROZZI MRN: [id number] Date of Birth: [birth year]  Today's Date: 12/29/2015 OT Individual Time: 0830-0900 OT Individual Time Calculation (min): 30 min     Short Term Goals: Week 1:  OT Short Term Goal 1 (Week 1): Pt will perform toilet transfer with mod A in order to decrease level of functional transfers.  OT Short Term Goal 2 (Week 1): Pt will engage in 2 minutes of sustained attention during functional task.  OT Short Term Goal 3 (Week 1): Pt will perform grooming tasks with min A. OT Short Term Goal 4 (Week 1): Pt will perform UB dressing with min A.   Skilled Therapeutic Interventions/Progress Updates:    Pt sound asleep at start of session.  It took a great deal of coaxing and arm rubbing to wake pt. Once awake pt would state "I will do whatever you want", but each time he was told to sit up he would begin and then lay back down.  It took over 20 min to get him to finally sit up. RN in room and assisted this therapist with coaxing pt to sit up. Max A to lower safely to chair from bed. Pt sat in chair and agreeable to trying to use toilet.   Max A to toilet to assist with controlled sit><stand.  Pt is impulsive with movements and does need tactile cues to stay seated until chair brakes are locked. Pt disoriented but pleasant. Assisted with dressing from w/c. PT arrived for next session.  Therapy Documentation Precautions:  Precautions Precautions: Fall Restrictions Weight Bearing Restrictions: Yes RUE Weight Bearing: Non weight bearing LUE Weight Bearing: Non weight bearing Other Position/Activity Restrictions: pt. has cast on left forearm, splint on right forearm    Vital Signs: Therapy Vitals Pulse Rate: 77 BP: (!) 150/80 Pain: Pain Assessment Pain Assessment: No/denies pain ADL:   See Function Navigator for Current Functional Status.   Therapy/Group: Individual Therapy  [place] 12/29/2015,  12:11 PM

## 2015-12-29 NOTE — Progress Notes (Signed)
[facility] Individual Statement of Services  Patient Name:  Justin Fox  Date:  12/29/2015  Welcome to the [facility].  Our goal is to provide you with an individualized program based on your diagnosis and situation, designed to meet your specific needs.  With this comprehensive rehabilitation program, you will be expected to participate in at least 3 hours of rehabilitation therapies Monday-Friday, with modified therapy programming on the weekends.  Your rehabilitation program will include the following services:  Physical Therapy (PT), Occupational Therapy (OT), Speech Therapy (ST), 24 hour per day rehabilitation nursing, Neuropsychology, Case Management (Social Worker), Rehabilitation Medicine, Nutrition Services and Pharmacy Services  Weekly team conferences will be held on Tuesdays to discuss your progress.  Your Social Worker will talk with you frequently to get your input and to update you on team discussions.  Team conferences with you and your family in attendance may also be held.  Expected length of stay:  3 weeks  Overall anticipated outcome:  Supervision with minimal assistance for stairs and lower body dressing  Depending on your progress and recovery, your program may change. Your Social Worker will coordinate services and will keep you informed of any changes. Your Social Worker's name and contact numbers are listed  below.  The following services may also be recommended but are not provided by the [facility] will be made to provide these services after discharge if needed.  Arrangements include referral to agencies that provide these services.  Your insurance has been verified to be:  [employer] Your primary doctor is:  Dr. Miguel Aschoff, [place].  Pertinent  information will be shared with your doctor and your insurance company.  Social Worker:  Alfonse Alpers, LCSW  [phone] or ([phone]  Information discussed with and copy given to patient by: Trey Sailors, 12/29/2015, 10:32 AM

## 2015-12-29 NOTE — Progress Notes (Signed)
Occupational Therapy Session Note  Patient Details  Name: Justin Fox MRN: [id number] Date of Birth: 1960-06-01  Today's Date: 12/29/2015 OT Individual Time: 1100-1155 OT Individual Time Calculation (min): 55 min     Short Term Goals: Week 1:  OT Short Term Goal 1 (Week 1): Pt will perform toilet transfer with mod A in order to decrease level of functional transfers.  OT Short Term Goal 2 (Week 1): Pt will engage in 2 minutes of sustained attention during functional task.  OT Short Term Goal 3 (Week 1): Pt will perform grooming tasks with min A. OT Short Term Goal 4 (Week 1): Pt will perform UB dressing with min A.   Skilled Therapeutic Interventions/Progress Updates:    Pt resting in w/c upon arrival with wife present.  Wife departed soon after start of therapy.  Pt perseverated on going to bathroom X 4 but without results.  On one occasion, pt refused to remove his pants before sitting down. Pt amb without AD requiring mod/max A to/from bathroom.  Pt perseverated on removing his short arm cast and thumb spica splint.  Pt perseverated on laying back into bed but easily redirected.  Pt required tot A for orientation to place, situation, date, his birth date, and his age.  Pt exhibited language of confusion throughout session.  Pt insisted that he knew me and my sister because he went to school with my sister.  Pt redirected easily but immediately returned to topic.  Pt engaged in BADL retraining requiring tot A to initiate and complete tasks.  Pt returned to bed and remained in bed with all needs within reach and bed alarm activated. Focus on orientation, cognitive remediation, BADL retraining, functional amb without AD, and safety awareness to increase independence with BADLs.  Therapy Documentation Precautions:  Precautions Precautions: Fall Restrictions Weight Bearing Restrictions: Yes RUE Weight Bearing: Non weight bearing LUE Weight Bearing: Non weight bearing Other  Position/Activity Restrictions: pt. has cast on left forearm, splint on right forearm General:   Vital Signs:  Pain: Pain Assessment Pain Assessment: No/denies pain ADL:   Exercises:   Other Treatments:    See Function Navigator for Current Functional Status.   Therapy/Group: Individual Therapy  Robbi, Gonzalo 12/29/2015, 2:00 PM
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
## 2015-12-29 NOTE — Progress Notes (Signed)
Physical Therapy Session Note  Patient Details  Name: Justin Fox MRN: [id number] Date of Birth: 1961-01-14  Today's Date: 12/29/2015 PT Individual Time: 0930-1000 PT Individual Time Calculation (min): 30 min    Short Term Goals: Week 1:  PT Short Term Goal 1 (Week 1): pt will sustain attention to functional task x 30 seconds with supervision PT Short Term Goal 2 (Week 1): Pt will perform functional transfers consistently with mod A PT Short Term Goal 3 (Week 1): Pt will gait in controlled environment x 25' with mod A  Skilled Therapeutic Interventions/Progress Updates:    Handoff from previous PT in room.  Session focus on attention to task, meaningful conversation, orientation, and transfers.  PT provided max multimodal cues for pt to stay attended to simple sorting task.  Pt requesting to use restroom, able to recall NWB BUEs with min question cues but unable to respect without max multimodal cues.  Stand/pivot from w/c<>toilet with mod assist to rise, max cues for clothing management.  Pt (-) void/BM on toilet, and returned to w/c at end of session.  Handoff to SLP.   Therapy Documentation Precautions:  Precautions Precautions: Fall Restrictions Weight Bearing Restrictions: Yes RUE Weight Bearing: Non weight bearing LUE Weight Bearing: Non weight bearing Other Position/Activity Restrictions: pt. has cast on left forearm, splint on right forearm   See Function Navigator for Current Functional Status.   Therapy/Group: Individual Therapy  [name] 12/29/2015, 11:28 AM
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
## 2015-12-29 NOTE — Progress Notes (Signed)
Calorie Count Note  48 hour calorie count ordered.  Diet: Dysphagia 1 with Honey-thick liquids Supplements: Ensure Enlive TID via PEG  Estimated Nutritional Needs:  Kcal:  2300-2500 Protein:  110-125 grams Fluid:  2.3-2.5 L/day  Breakfast: 410 kcal, 4 g protein Lunch: 405 kcal, 16 g protein Dinner: 907 kcal, 31 g protein Supplements: 350 kcal, 20 grams protein  Total intake: 2072 kcal (90% of minimum estimated needs)  71 grams protein (65% of minimum estimated needs)  Pt is eating well at some meals, but no all. Per RN, pt was lethargic this morning and did not want to eat, but he later ate some breakfast (~50%) with SLP. Ensure was given due to misunderstanding that Ensure is to be given via PEG, not PO.   Nutrition Dx: Malnutrition related to dysphagia, catabolic illness, poor appetite as evidenced by percent weight loss, moderate depletions of muscle mass, moderate depletion of body fat.  Goal: Pt to meet >/= 90% of their estimated nutrition needs   Intervention:  Continue Magic cup TID with meals, each supplement provides 290 kcal and 9 grams of protein Provide Ensure Enlive po TID PRN (if meal completion is less than 80%), each supplement provides 350 kcal and 20 grams of protein (Pt will need 30 ml FWF before and after each bolus of Ensure.)  Scarlette Ar RD, CSP, LDN Inpatient Clinical Dietitian Pager: [phone] After Hours Pager: [phone]

## 2015-12-29 NOTE — Progress Notes (Signed)
Physical Therapy Note  Patient Details  Name: Justin Fox MRN: [id number] Date of Birth: 05-22-1960 Today's Date: 12/29/2015  1450-1600, 70 min Pain: none reported  tx focused on attention, R/L UE functional use to manipulate and throw bean bags, bed mobility. Pt able to attend to bean bag task x 20 minutes with max re-directing cues. Pt reached overhead with L hand to grasp bean bags, and pronated/supinated to grasp bean bags with R hand.  Pt appeared to have vertigo with head movements, resolving in a few seconds.  Supine> sit x 2 with mod assist, but pt would not stay in sitting > 2 seconds, quickly lying back down. +2 for sit> stand and transfer to [facility] due to pt stating he needed to use toilet.  Pt sat on BSC but did not void or have BM. Pt left resting in bed with bed alarm set and all needs within reach.    Carlesha Seiple 12/29/2015, 3:46 PM

## 2015-12-29 NOTE — Progress Notes (Signed)
Physical Therapy Session Note  Patient Details  Name: RASHED HAPNER MRN: [id number] Date of Birth: 1960/12/19  Today's Date: 12/29/2015 PT Individual Time: 0910-0930 PT Individual Time Calculation (min): 20 min    Short Term Goals: Week 1:  PT Short Term Goal 1 (Week 1): pt will sustain attention to functional task x 30 seconds with supervision PT Short Term Goal 2 (Week 1): Pt will perform functional transfers consistently with mod A PT Short Term Goal 3 (Week 1): Pt will gait in controlled environment x 25' with mod A  Skilled Therapeutic Interventions/Progress Updates:    Pt received in hand off from OT; pt denied c/o pain. Pt's wife present for session. Session focused on attention to task by having pt sort bean bags into colored piles. Pt required max multimodal cuing to attend to task with pt demonstrating sustained attention for 10-20 seconds at a time. Pt unable to follow commands without max cuing. Pt perseverated on "going to the bathroom" and "I need to lay down" even though pt received in handoff from OT after toileting. Educated pt on need to wear BUE braces/casts for safety & healing. At end of session pt left sitting in w/c with wife present & hand off to next PT.  Therapy Documentation Precautions:  Precautions Precautions: Fall Restrictions Weight Bearing Restrictions: Yes RUE Weight Bearing: Non weight bearing LUE Weight Bearing: Non weight bearing Other Position/Activity Restrictions: pt. has cast on left forearm, splint on right forearm   See Function Navigator for Current Functional Status.   Therapy/Group: Individual Therapy  [name] 12/29/2015, 12:20 PM

## 2015-12-30 ENCOUNTER — Inpatient Hospital Stay (HOSPITAL_COMMUNITY): Payer: 59

## 2015-12-30 ENCOUNTER — Inpatient Hospital Stay (HOSPITAL_COMMUNITY): Payer: 59 | Admitting: Physical Therapy

## 2015-12-30 ENCOUNTER — Encounter (HOSPITAL_COMMUNITY): Payer: 59

## 2015-12-30 ENCOUNTER — Inpatient Hospital Stay (HOSPITAL_COMMUNITY): Payer: 59 | Admitting: Speech Pathology

## 2015-12-30 MED ORDER — METHYLPHENIDATE HCL 5 MG PO TABS
5.0000 mg | ORAL_TABLET | Freq: Two times a day (BID) | ORAL | Status: DC
  Administered 2015-12-30 – 2016-01-02 (×6): 5 mg via ORAL
  Filled 2015-12-30 (×6): qty 1

## 2015-12-30 MED ORDER — ENSURE ENLIVE PO LIQD
237.0000 mL | Freq: Two times a day (BID) | ORAL | Status: DC
  Administered 2015-12-30 – 2016-01-05 (×12): 237 mL

## 2015-12-30 MED ORDER — TRAZODONE HCL 50 MG PO TABS
25.0000 mg | ORAL_TABLET | Freq: Once | ORAL | Status: AC
  Administered 2015-12-30: 25 mg via ORAL
  Filled 2015-12-30: qty 1

## 2015-12-30 NOTE — IPOC Note (Signed)
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 ([place]) Active Problems:   Fracture of face bones ([place])   Radial styloid fracture   Colles' fracture of left radius   Post-operative pain   Agitation   Dysphagia   Urinary retention   Slow transit constipation     Functional Problem List: Nursing Behavior, Bladder, Bowel, Edema, Endurance, Medication Management, Motor, Nutrition, Pain, Perception, Safety, Skin Integrity  PT Balance, Endurance, Motor, Safety  OT Balance, Cognition, Endurance, Behavior, Motor, Pain, Perception, Safety  SLP Cognition, Safety, Linguistic  TR         Basic ADL's: OT Eating, Grooming, Bathing, Dressing, Toileting     Advanced  ADL's: OT  (n/a )     Transfers: PT Bed Mobility, Bed to Chair, Car, Manufacturing systems engineer, Metallurgist: PT Ambulation, Emergency planning/management officer, Stairs     Additional Impairments: OT Fuctional Use of Upper Extremity  SLP Swallowing, Communication, Social Cognition comprehension, expression Social Interaction, Problem Solving, Memory, Attention, Awareness  TR      Anticipated Outcomes Item Anticipated Outcome  Self Feeding set up A  Swallowing  Min A   Basic self-care  supervision - mod A  Toileting  mod A   Bathroom Transfers supervision - toilet transfer and shower transfer  Bowel/Bladder  Mod assist  Transfers  supervision  Locomotion  supervision  Communication  Min A  Cognition  Min A   Pain  3 or less  Safety/Judgment  mod assist   Therapy Plan: PT Intensity: Minimum of 1-2 x/day ,45 to 90 minutes PT Frequency: 5 out of 7 days PT Duration Estimated Length of Stay: 21-28 days OT Intensity: Minimum of 1-2 x/day, 45 to 90 minutes OT Frequency: 5 out of 7 days OT Duration/Estimated  Length of Stay: 21-24 days SLP Intensity: Minumum of 1-2 x/day, 30 to 90 minutes SLP Frequency: 3 to 5 out of 7 days SLP Duration/Estimated Length of Stay: 3 weeks        Team Interventions: Nursing Interventions Patient/Family Education, Bladder Management, Bowel Management, Pain Management, Medication Management, Dysphagia/Aspiration Precaution Training, Cognitive Remediation/Compensation, Skin Care/Wound Management, Discharge Planning, Psychosocial Support, Disease Management/Prevention  PT interventions Ambulation/gait training, Community reintegration, Neuromuscular re-education, DME/adaptive equipment instruction, Stair training, UE/LE Strength taining/ROM, Wheelchair propulsion/positioning, UE/LE Coordination activities, Therapeutic Activities, Pain management, Discharge planning, Training and development officer, Functional electrical stimulation, Functional mobility training, Cognitive remediation/compensation, Patient/family education, Splinting/orthotics, Therapeutic Exercise  OT Interventions Training and development officer, Community reintegration, Discharge planning, Cognitive remediation/compensation, Neuromuscular re-education, Self Care/advanced ADL retraining, Therapeutic Exercise, Wheelchair propulsion/positioning, Pain management, Skin care/wound managment, UE/LE Strength taining/ROM, Patient/family education, UE/LE Coordination activities, Splinting/orthotics, Functional electrical stimulation, DME/adaptive equipment instruction, Functional mobility training, Psychosocial support, Therapeutic Activities  SLP Interventions Cognitive remediation/compensation, Cueing hierarchy, Functional tasks, Patient/family education, Dysphagia/aspiration precaution training, Internal/external aids, Speech/Language facilitation, Environmental controls, Therapeutic Activities  TR Interventions    SW/CM Interventions Discharge Planning, Psychosocial Support, Patient/Family Education    Team Discharge  Planning: Destination: PT-Home ,OT- Home , SLP-Home Projected Follow-up: PT-24 hour supervision/assistance, Outpatient PT, OT-  24 hour supervision/assistance, Outpatient OT, SLP-Home Health SLP, Outpatient SLP, 24 hour supervision/assistance Projected Equipment Needs: PT-To be determined, OT- To be determined, SLP-To be determined Equipment Details: PT- , OT-  Patient/family involved in discharge planning: PT- Patient, Family member/caregiver,  OT-Patient, Family member/caregiver,  SLP-Patient, Family member/caregiver  MD ELOS: 21-25 days Medical Rehab Prognosis:  Excellent Assessment: The patient has been admitted for CIR therapies with the diagnosis of TBI. The team will be addressing functional mobility, strength, stamina, balance, safety, adaptive techniques and equipment, self-care, bowel and bladder mgt, patient and caregiver education, NMR, cognitive perceptual awareness, behavior, communication, swallowing, brain injury awareness, ortho precautions, pain mgt. Goals have been set at supervision to mod assist with ADL's and self-care and supervision with mobility, supervision to min assist with cognition and communication. Family very supportive.   Meredith Staggers, MD, FAAPMR      See Team Conference Notes for weekly updates to the plan of care

## 2015-12-30 NOTE — Progress Notes (Signed)
Speech Language Pathology Daily Session Note  Patient Details  Name: Justin Fox MRN: [id number] Date of Birth: [birth year]  Today's Date: 12/30/2015 SLP Individual Time: 1400-1500 SLP Individual Time Calculation (min): 60 min   Short Term Goals: Week 1: SLP Short Term Goal 1 (Week 1): Patient will orient to place and situation with Max A question and visual cues.  SLP Short Term Goal 2 (Week 1): Patient will demonstrate sustained attention to functional tasks for ~1 minute with Max A verbal cues for redirection.  SLP Short Term Goal 3 (Week 1): Patient will follow 1 step commands in 75% of opportunities with Mod A verbal cues.  SLP Short Term Goal 4 (Week 1): Patient will identify 1 cognitive and 1 physical deficit with Max A multimodal cues. SLP Short Term Goal 5 (Week 1): Patient will consume trials of upgraded liquids without overt s/s of aspiration over 2 consecutive sessions prior to repeat MBS.  SLP Short Term Goal 6 (Week 1): Patient will consume current diet with minimal overt s/s of aspiration with Mod A verbal cues for use of swallowing compensatory strategies.   Skilled Therapeutic Interventions:Skilled therapy intervention focused on cognitive and dysphagia goals. Patient required Max A verbal and visual cues to demonstrate selective attention to conversational speech. Patient oriented to person. Required max A to accurately recall [facility] and [place] and September. Improved recall of pertinent personal information such as names of children and other family members with mod verbal cueing. Language of confusion persists.  Pt trialed with ice chips and thin water via teaspoon with occasional wet vocal quality which cleared with spontaneous throat clear. Patient left upright in bed with daughter present. Continue current plan of care.    Function:  Eating Eating                 Cognition Comprehension Comprehension assist level: Understands basic less than 25% of the time/  requires cueing >75% of the time  Expression   Expression assist level: Expresses basic 25 - 49% of the time/requires cueing 50 - 75% of the time. Uses single words/gestures.  Social Interaction Social Interaction assist level: Interacts appropriately 25 - 49% of time - Needs frequent redirection.  Problem Solving Problem solving assist level: Solves basic less than 25% of the time - needs direction nearly all the time or does not effectively solve problems and may need a restraint for safety  Memory Memory assist level: Recognizes or recalls less than 25% of the time/requires cueing greater than 75% of the time    Pain Pain Assessment Pain Assessment: No/denies pain  Therapy/Group: Individual Therapy  Vinetta Bergamo MA, CCC-SLP 12/30/2015, 4:04 PM

## 2015-12-30 NOTE — Progress Notes (Signed)
2145 Patient bed alarm sounding and Charge Nurse and CNA staff immediately to patients room 19 to observe bed in low position and patient on knees with slipper socks on attempting to get back into bed. No injuries assessed and staff assists patient into bed. Vital signs obtained and found to be within normal range. Physician notified at 2235. No new orders received. 2145 Wife of patient notified. Continue to monitor.

## 2015-12-30 NOTE — Progress Notes (Signed)
Speech Language Pathology Daily Session Note  Patient Details  Name: Justin Fox MRN: [id number] Date of Birth: [birth year]  Today's Date: 12/30/2015 Session 1 SLP Individual Time: 0730-0800  SLP Individual Time Calculation (min): 30 min   Session 2 SLP Individual Time: 1032-1100 SLP Individual Time Calculation (min): 28 min    Short Term Goals: Week 1: SLP Short Term Goal 1 (Week 1): Patient will orient to place and situation with Max A question and visual cues.  SLP Short Term Goal 2 (Week 1): Patient will demonstrate sustained attention to functional tasks for ~1 minute with Max A verbal cues for redirection.  SLP Short Term Goal 3 (Week 1): Patient will follow 1 step commands in 75% of opportunities with Mod A verbal cues.  SLP Short Term Goal 4 (Week 1): Patient will identify 1 cognitive and 1 physical deficit with Max A multimodal cues. SLP Short Term Goal 5 (Week 1): Patient will consume trials of upgraded liquids without overt s/s of aspiration over 2 consecutive sessions prior to repeat MBS.  SLP Short Term Goal 6 (Week 1): Patient will consume current diet with minimal overt s/s of aspiration with Mod A verbal cues for use of swallowing compensatory strategies.   Skilled Therapeutic Interventions:  Session 1: Skilled therapy intervention focused on cognitive goals. Patient required Max A verbal and visual cues to demonstrate sustained attention to card sorting task from a field of two for a maximum of 10 seconds. Patient oriented to person and given Total A verbal cues for use of external aids to orient himself to place and date. Patient participated in oral care given Max A verbal and tactile cues for initiation and sustained attention to task; face washing Total A. Patient left upright in bed with neuropsychologist in room, bed alarm on, and all needs within reach. Continue current plan of care.    Session 2: Skilled therapy intervention focused on cognitive goals. Max A verbal  and tactile cues were required to rouse the patient to full alertness. During a coin sorting task, patient required Total A for initiation when separating the two denominations. Patient demonstrated sustained attention for a maximum of 10 seconds when attempting the structured coin sorting task. Patient performed the automatic task of counting the coins given a single question cue. Patient's conversation was tangential and primarily expressed automatic social graces. When given a toothbrush for oral care, patient required Mod A verbal and tactile cues to complete procedural memory task. Patient left upright in bed with OT in room and all needs within reach. Continue current plan of care.   Function:  Cognition Comprehension Comprehension assist level: Understands basic less than 25% of the time/ requires cueing >75% of the time  Expression   Expression assist level: Expresses basic 25 - 49% of the time/requires cueing 50 - 75% of the time. Uses single words/gestures.  Social Interaction Social Interaction assist level: Interacts appropriately 25 - 49% of time - Needs frequent redirection.  Problem Solving Problem solving assist level: Solves basic less than 25% of the time - needs direction nearly all the time or does not effectively solve problems and may need a restraint for safety  Memory Memory assist level: Recognizes or recalls less than 25% of the time/requires cueing greater than 75% of the time    Pain Pain Assessment Pain Assessment: No/denies pain  Therapy/Group: Individual Therapy  [name] 12/30/2015, 12:13 PM

## 2015-12-30 NOTE — Final Consult Note (Signed)
NEUROCOGNITIVE Justin Fox   [name] is a 55 year old man, who was seen for a brief neurocognitive status examination to evaluate his emotional and cognitive functioning post-TBI.  According to his medical record, he fell 30 feet from scaffolding on November 23, 2015; it was unclear from records whether or not he lost consciousness.  His head CT revealed multiple skull fractures on the right as well as a right frontal contusion with associated subdural hematoma without midline shift.  He was in ICU for 13 days and during that time was noted to have intracranial hypertension, cerebral hypoxia, and resultant quadriparesis.     Cognitive Functioning:  Justin Fox total score on a very brief measure of mental status was markedly impaired (MMSE-2 brief = 4/16).  He was able to repeat 3 words initially after presentation and correctly identify the floor of the building that he was on, but was otherwise fully disoriented to date and location and was unable to freely recall any of the previously studied words after a brief delay.   Of note, later on in the session when asked about his appetite, he stated, "You said something about eggs 2 or 3 hours ago;" and "egg" was one of the target words on the prior mental status exam (administered approximately 5 minutes before his remark).  He was observed to have mumbled speech, which had disorganized and perseverative content; he often rambled, but occasionally could respond appropriately to directed questions.  He demonstrated significant confusion, as he repeatedly called the neuropsychologist by the wrong name even immediately after being corrected.  It later became apparent that he was reading information from his white board and was integrating it together inappropriately; for example, he said that he knew the neuropsychologists' name was "Dr. Duffy Rhody, or Sonia Baller, but Steele Sizer was] sure that  [the neuropsychologist] would answer to any of them."  The names were all written on his board, but were to denote the names of his nurse, Nira Conn, his social worker, Sonia Baller, and his physician, Dr. Naaman Plummer.  Justin Fox was unable to explain or expand upon responses.  For example, he stated that he has been depressed in the past, but could not describe the nature or circumstances surrounding any depression and was unsure whether he ever underwent treatment.  Also, when asked about how he was sleeping, he responded that he was unsure.    Emotional Functioning:  As stated above, Justin Fox acknowledged a history of depression, but he denied current low mood.  He also denied any worry or anxiety and stated that his only concern at the moment was needing a bowel movement (his nurses informed the neuropsychologist before the session that this concern was already being addressed).  Justin Fox did not endorse other complaints; he spent most of the session attempting to schedule a follow-up session with the neuropsychologist on various days next week.    IMPRESSION:  Justin Fox cognitive functioning was markedly impaired.  He presented as highly confused throughout the session and his score on a very brief measure of mental status was profoundly impaired.  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  It is hoped that with time and recovery from injury, that Justin Fox will also experience cognitive recovery.  However, his cognitive functioning should be monitored over time, as it is difficult to predict whether or not he will return  to cognitive baseline.  In the meantime, he should not be expected to remember instructions from moment to moment and he will likely even be confused by written directions.  He needs in-person step-by-step directions.  There is also concern, given the magnitude of his cognitive disruption,  that he may not be able to problem-solve in order to meet his needs (e.g. push the call button when needed) and nursing staff may need to schedule times to stop in on a regular basis to check on his needs.  At this time, there was no evidence to suggest the presence of a mental health disorder.  Follow-up with the neuropsychologist prior to discharge could be requested to re-assess cognitive progress and help determine treatment recommendations.    DIAGNOSES:   TBI  Marlane Hatcher, Psy.D.  Clinical Neuropsychologist

## 2015-12-30 NOTE — Progress Notes (Signed)
[place] PHYSICAL MEDICINE & REHABILITATION     PROGRESS NOTE    Subjective/Complaints: No new complaints. Could find my name on dry erase board with time. Still distracted. Denies pain. Says he's doing "well".   ROS very limited by mental status  Objective: Vital Signs: Blood pressure 116/79, pulse 78, temperature 97.2 F (36.2 C), temperature source Oral, resp. rate 18, weight 78.9 kg (173 lb 14.7 oz), SpO2 97 %. No results found.  Recent Labs  12/27/15 1451 12/28/15 1319  WBC 8.0 8.4  HGB 10.4* 11.1*  HCT 32.5* 34.4*  PLT 348 345    Recent Labs  12/27/15 1451 12/28/15 1319  NA  --  135  K  --  3.8  CL  --  103  GLUCOSE  --  115*  BUN  --  13  CREATININE 0.69 0.67  CALCIUM  --  9.2   CBG (last 3)  No results for input(s): GLUCAP in the last 72 hours.  Wt Readings from Last 3 Encounters:  12/29/15 78.9 kg (173 lb 14.7 oz)  11/23/15 80 kg (176 lb 5.9 oz)  10/12/15 86.2 kg (190 lb)    Physical Exam:  Constitutional: He appears well-developed. NAD. Eyes: Right eye ptosis.  Conj are normal.  Neck: Normal range of motion. Neck supple.  Trach site healing, small scab present. Cardiovascular: Normal rateand regular rhythm.  Respiratory: Effort normaland breath sounds normal. No respiratory distress.  GI: Soft. Bowel sounds are normal. He exhibits no distension.  PEG tube in place Neurological: He is somnolent.  Patient is impulsive decreased safety awareness as a whole.  limited insight. Oriented to self only. ?hospital when given choices. Appears to have expressive language deficits/perseverates.  Moves all 4's inconsistently Sensation appears to be intact to light touch throughout. DTRs 3+ LLE. Skin:  Left splint upper extremity and Hand splint RUE Psych: Unable to assess to due cognition  Assessment/Plan: 1. Functional, mobility and cognitive deficits secondary to TBI with polytrauma which require 3+ hours per day of interdisciplinary therapy  in a comprehensive inpatient rehab setting. Physiatrist is providing close team supervision and 24 hour management of active medical problems listed below. Physiatrist and rehab team continue to assess barriers to discharge/monitor patient progress toward functional and medical goals.  Function:  Bathing Bathing position Bathing activity did not occur: Refused    Bathing parts   Body parts bathed by helper: Right arm, Left arm, Chest, Abdomen, Front perineal area, Buttocks, Right upper leg, Left upper leg, Right lower leg, Left lower leg  Bathing assist        Upper Body Dressing/Undressing Upper body dressing   What is the patient wearing?: Pull over shirt/dress     Pull over shirt/dress - Perfomed by patient: Thread/unthread right sleeve, Thread/unthread left sleeve Pull over shirt/dress - Perfomed by helper: Pull shirt over trunk, Put head through opening        Upper body assist Assist Level:  (mod A)      Lower Body Dressing/Undressing Lower body dressing   What is the patient wearing?: Non-skid slipper socks, Pants       Pants- Performed by helper: Thread/unthread right pants leg, Thread/unthread left pants leg, Pull pants up/down   Non-skid slipper socks- Performed by helper: Don/doff right sock, Don/doff left sock                  Lower body assist        Toileting Toileting Toileting activity did not occur: No  continent bowel/bladder event Toileting steps completed by patient: Adjust clothing after toileting Toileting steps completed by helper: Adjust clothing prior to toileting, Performs perineal hygiene, Adjust clothing after toileting    Toileting assist Assist level:  (max A)   Transfers Chair/bed transfer   Chair/bed transfer method: Stand pivot Chair/bed transfer assist level: Maximal assist (Pt 25 - 49%/lift and lower)       Locomotion Ambulation     Max distance: 10 ft Assist level: Moderate assist (Pt 50 - 74%)   Wheelchair      Max wheelchair distance: 25 Assist Level: Supervision or verbal cues  Cognition Comprehension Comprehension assist level: Understands basic less than 25% of the time/ requires cueing >75% of the time  Expression Expression assist level: Expresses basic 25 - 49% of the time/requires cueing 50 - 75% of the time. Uses single words/gestures.  Social Interaction Social Interaction assist level: Interacts appropriately 25 - 49% of time - Needs frequent redirection.  Problem Solving Problem solving assist level: Solves basic less than 25% of the time - needs direction nearly all the time or does not effectively solve problems and may need a restraint for safety  Memory Memory assist level: Recognizes or recalls less than 25% of the time/requires cueing greater than 75% of the time   Medical Problem List and Plan: 1. 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 Status post ORIF of multiple facial fractures 12/08/2015  -continue CIR therapies  -family conference tomorrow  -right SPICA splint to allow more use of RUE in therapy 2. DVT Prophylaxis/Anticoagulation: Subcutaneous Lovenox.   -dopplers negative 3. Pain Management: will use tramadol and tylenol for pain.  4. Mood: Seroquel 12.5 mg twice a day, will wean as tolerated  -initiate ritalin trial for attention, 5mg  5. Neuropsych: This patient is notcapable of making decisions on hisown behalf. 6. Skin/Wound Care: Routine skin checks 7. Fluids/Electrolytes/Nutrition: Routine I&O with follow-up chemistries 8.Tracheostomy. Decannulated. Monitor for healing. 9.Dysphagia. Gastrostomy tube 12/03/2015. Currently on a dysphagia #1 honey thick liquid diet.  -f/u MBS per SLP  -would stay with soft diet given facial fractures regardless of how much liquids are advanced 9.Hypertension. Norvasc 10 mg daily, Coreg 12.5 mg twice a day. Monitor with increased mobility 10.Nondisplaced right radial styloid  fracture. Nonweightbearing 11. Comminuted distal left radius fracture Status post closed reduction.. Nonweightbearing 12..Urinary retention. Flomax 0.8 mg daily. Emptying bladder 13.Constipation. Laxative assistance   LOS (Days) 3 A FACE TO FACE EVALUATION WAS PERFORMED  Justin Fox 12/30/2015 10:53 AM

## 2015-12-30 NOTE — Progress Notes (Signed)
Occupational Therapy Session Note  Patient Details  Name: Justin Fox MRN: [id number] Date of Birth: [birth year]  Today's Date: 12/30/2015 OT Individual Time: 1100-1156 OT Individual Time Calculation (min): 56 min     Short Term Goals: Week 1:  OT Short Term Goal 1 (Week 1): Pt will perform toilet transfer with mod A in order to decrease level of functional transfers.  OT Short Term Goal 2 (Week 1): Pt will engage in 2 minutes of sustained attention during functional task.  OT Short Term Goal 3 (Week 1): Pt will perform grooming tasks with min A. OT Short Term Goal 4 (Week 1): Pt will perform UB dressing with min A.   Skilled Therapeutic Interventions/Progress Updates:    Pt resting in bed upon arrival with wife present.  Initial focus on BADL retraining.  Pt required max verbal cues to participate in therapy and max A for supine>sit EOB in preparation for transfer to w/c.  Pt required max A for stand pivot transfer to w/c.  Pt unable to/refused to participate in bathing tasks.  Pt attempted to don his shirt but required assistance with orientation.  Pt pulled up his pants but refused/unable to thread legs.  Pt not oriented to place, situation, or date.  Pt was able to remember his birthday this year.  Pt recognized his daughter on arrival.  Pt conversation tangential throughout session.  Pt required max verbal cues to redirect to topic/task.  Pt requested to use the bathroom but resisted sitting EOB in preparation for walking to bathroom.  Pt forcefully returned to supine position in bed.  Pt able to sustain attention approx 15 seconds during functional tasks. Pt remained in bed with daughter present, bed alarm activated, and all needs within reach.  Therapy Documentation Precautions:  Precautions Precautions: Fall Restrictions Weight Bearing Restrictions: Yes RUE Weight Bearing: Non weight bearing LUE Weight Bearing: Non weight bearing Other Position/Activity Restrictions: pt. has cast  on left forearm, splint on right forearm Pain:  No s/s pain  See Function Navigator for Current Functional Status.   Therapy/Group: Individual Therapy  [name], [name] 12/30/2015, 12:04 PM

## 2015-12-30 NOTE — Progress Notes (Signed)
Physical Therapy Note  Patient Details  Name: Justin Fox MRN: [id number] Date of Birth: 1960/06/10 Today's Date: 12/30/2015    Time: 1300-1346 46 minutes  1:1 no c/o pain. Pt resting in bed with PT arrival.  Required max encouragement from PT and daughter to get OOB and participate. Pt perseverating on being tired, also with perseveration on counting.  Pt min A to transfer to w/c.  Fluctuates between supervision and total A with w/c mobility with bilat LEs depending on attention and fatigue. Pt states he needs to use bathroom. Pt mod A toilet transfer.  Min A standing balance while helper pulls up pants.  No bowel or bladder movement in bathroom, pt continues to perseverate on this during session.  Pt assisted with w/c mobility outdoors and enjoyed being outside. Pt requires mod cuing to recall family members names, limited by poor attention and perseveration on fatigue throughout session.   Justin Fox 12/30/2015, 1:47 PM

## 2015-12-30 NOTE — Progress Notes (Signed)
Calorie Count Note  48 hour calorie count ordered and now complete.  Diet: Dysphagia 1 with honey-thick liquids Supplements: Ensure Enlive via PEG  Estimated Nutritional Needs: Kcal:2300-2500 Protein:110-125 grams Fluid:2.3-2.5 L/day  Breakfast: 923 kcal, 34 g protein Lunch: 774 kcal, 30 g protein Dinner: 368 kcal, 17 g protein Supplements: 700 kcal, 40 g protein  Total intake: 2765 kcal (120% of minimum estimated needs)  121 grams protein (110% of minimum estimated needs)  Yesterday patient ate 90% of two meals and about 50% of one meal. He sometimes eats the Magic Cup ice cream, but not at every meal. He received Ensure Enlive twice yesterday and ultimately he met his estimated needs for protein and exceeded estimated needs for calories.   Nutrition Dx: Malnutritionrelated to dysphagia, catabolic illness, poor appetiteas evidenced by percent weight loss, moderate depletions of muscle mass, moderate depletion of body fat.  Goal: Pt to meet >/= 90% of their estimated nutrition needs   Intervention:  Continue Magic cup TID with meals, each supplement provides 290 kcal and 9 grams of protein  Provide Ensure Enlive per tube BID, each supplement provides 350 kcal and 20 grams of protein (Pt will need 30 ml FWF before and after each bolus of Ensure.)  Re-assess need for continuing Ensure at next weight check.   Scarlette Ar RD, CSP, LDN Inpatient Clinical Dietitian Pager: [phone] After Hours Pager: [phone]

## 2015-12-31 ENCOUNTER — Inpatient Hospital Stay (HOSPITAL_COMMUNITY): Payer: 59 | Admitting: Physical Therapy

## 2015-12-31 ENCOUNTER — Inpatient Hospital Stay (HOSPITAL_COMMUNITY): Payer: 59

## 2015-12-31 ENCOUNTER — Inpatient Hospital Stay (HOSPITAL_COMMUNITY): Payer: 59 | Admitting: Speech Pathology

## 2015-12-31 ENCOUNTER — Inpatient Hospital Stay (HOSPITAL_COMMUNITY): Payer: 59 | Admitting: Occupational Therapy

## 2015-12-31 NOTE — Progress Notes (Signed)
Occupational Therapy Session Note  Patient Details  Name: Justin Fox MRN: [id number] Date of Birth: July 06, 1960  Today's Date: 12/31/2015 OT Individual Time: 1030-1130 OT Individual Time Calculation (min): 60 min     Short Term Goals: Week 1:  OT Short Term Goal 1 (Week 1): Pt will perform toilet transfer with mod A in order to decrease level of functional transfers.  OT Short Term Goal 2 (Week 1): Pt will engage in 2 minutes of sustained attention during functional task.  OT Short Term Goal 3 (Week 1): Pt will perform grooming tasks with min A. OT Short Term Goal 4 (Week 1): Pt will perform UB dressing with min A.   Skilled Therapeutic Interventions/Progress Updates:    Pt engaged in BADL retraining including bathing/dressing with sit<>stand from w/c at sink.  Pt required max multimodal cues for task initiation and sequencing.  Pt required max encouragement to perform tasks and continually asked/told therapist to complete tasks for him.  Pt required max verbal cues for attention to task and redirection.  Pt's daughter present and provided appropriate encouragement and verbal cues throughout session.  Pt transferred to geri chair at end of session and remained in chair with tray and QRB in place; daughter present. Focus on activity tolerance, sit<>stand, functional transfers, task initiation, sequencing, attention to task, and safety awareness to increase independence with BADLs.  Therapy Documentation Precautions:  Precautions Precautions: Fall Restrictions Weight Bearing Restrictions: Yes RUE Weight Bearing: Non weight bearing LUE Weight Bearing: Non weight bearing Other Position/Activity Restrictions: pt. has cast on left forearm, splint on right forearm General:   Vital Signs:   Pain:   ADL:   Exercises:   Other Treatments:    See Function Navigator for Current Functional Status.   Therapy/Group: Individual Therapy  [name], [name] 12/31/2015, 11:54 AM

## 2015-12-31 NOTE — Progress Notes (Signed)
Occupational Therapy Note  Patient Details  Name: [name] MRN: [id number] Date of Birth: [birth year]  Today's Date: 12/31/2015 OT Individual Time: 1400-1430 OT Individual Time Calculation (min): 30 min    Pt denied pain Individual therapy  Pt resting in bed upon arrival with daughter present.  Pt required max encouragement and max A for supine>sit EOB to participate in therapy.  Pt stated he needed to use the bathroom and transferred to w/c with mod A.  Pt transferred to toilet but was unable to void.  Pt returned to w/c and engaged in pipe tree activity (simplest structure). Pt required max multimodal cues to complete with max verbal cues for attention to task.  Pt requested to use toilet again and transferred to toilet.  Pt successful with voiding but encouraged to remain on toilet.  PT relieved therapist. Focus on activity tolerance, sitting balance, functional transfers, attention to task, sustained attention, and safety awareness to increase independence with BADLs.   Justin Fox, Justin Fox [facility] 12/31/2015, 2:47 PM

## 2015-12-31 NOTE — Progress Notes (Addendum)
Physical Therapy Note  Patient Details  Name: Justin Fox MRN: [id number] Date of Birth: 1960/09/13 Today's Date: 12/31/2015    Time: 830-900 30 minutes  1:1 No c/o pain.  Pt required max encouragement to get OOB but required only mod A for supine to sit and transfer to w/c.  Pt presented with shorts to don. Pt able to initiate putting shorts on but then asked PT to pull them up. Pt able to pull shorts up with mod A for standing balance and increased encouragement due to decreased initiation and attention. Pt able to propel w/c with bilat LEs to day room and requires max cues for safety with eating magic cup ice cream.  Pt then able to transfer to geri chair with mod A. Pt placed in geri chair at nursing station with quick release belt and full lap tray.  Time 2: 1430-1500 30 minutes  1:1 No c/o pain. Pt in restroom, hand off from OT. Pt requires max cuing to stay on toilet, asks to get up every 5-10 seconds, PT encouraged pt to attempt bowel movement, pt unable to have successful BM.  Pt performed seated activities with bean bags. Pt able to correctly identify 1 bean bag out of a group of 4 with 75% accuracy. Pt able to tell what meal the food would be eaten during with 50% accuracy. Able to pick pu and sort bean bags by color with max cuing for initiation and attention but 100% accuracy.  Pt then says "I want to get up and walk". Pt performed gait 180' with mod-max A due to ataxia especially with fatigue. Pt improving alertness each day.   Iver Miklas 12/31/2015, 11:04 AM

## 2015-12-31 NOTE — Progress Notes (Addendum)
Social Work Patient ID: Justin Fox, male   DOB: 1960/06/19, 55 y.o.   MRN: [id number]   Patient/Family Conference - 12-31-15 at [place] in [place] in attendance:  Justin Fox - pt's wife; Justin Fox - pt's dtr;  Justin Fox via speaker phone- pt's brother  Staff in attendance:  Isabelle Course, PT; Weston Anna, SLP; Roanna Epley, OTA; Sonia Baller Veer Elamin, LCSW; Dr. Alger Simons, MD  Main focus:  Update pt's family and give them an opportunity to ask questions as this is pt's first week on CIR and he is at a [place] [facility].  Synopsis of information shared:  Each therapist gave a synopsis of pt's condition.                                                       Courtney - Pt is getting better everyday.  Language is confused, but he's able to be understood.  Pt's awareness is coming around and thinking skills are getting better.  Pt's swallowing is better if he focuses and pays attention to task.  She hopes to do a new MBS mid next-week and that she can upgrade liquids.  She is not sure if she will upgrade textures at that time.  Wait to see test.  Dtr asked about if Ensure needs to be thickened and it does.  Staff did not thicken it, but dtr did.  Courtney to address, as pt is on honey-thick liquids and Ensure is thin.  Dtr mentioned that pt slurring or mumbling is normal baseline for him.                                                        Santiago Glad - All team members will work on cognition and improving focus.  She is specifically working on getting him out of bed and doing functional tasks, then work on balance and walking later.  They are still assessing his dizziness.  Pt has good strength and moves around well, so this will help him.                                                        Tom - Also working on [employer] and helping him to initiate more and pay attention to tasks.  Pt is resistant to bathing, but can dress himself with some assistance.  Really  trying to focus on getting him up and moving.  Mentioned that as pt's thinking skills improve, his functional progress will also improve.                                                        Sonia Baller - Discussed the reason behind neuropsychologist's visit, to get a baseline of where pt is now so that we can assess for progress.  Family was accepting of that visit yesterday and thankful.  CSW offered the family "A Patient's Guide to Brain Injury" booklet, but they already have one.                                                        Dr. Naaman Plummer - He came into meeting after therapists had left, but addressed family's concern over following up with ortho at [facility] vs. Ortho here.  Dr. Naaman Plummer will ask trauma surgery to follow pt here and perhaps avoid the need for pt to go back and forth to [facility].  Also gave a brief medical update.    Barriers/concerns expressed by patient and family:  Family was concerned about pt following up at [facility] just following here.  They also wanted to mention if staff/visitors can use language such as "hospital" and "therapies" instead of "rehab" as pt had a strong response to this, possibly due to pt's former substance abuse and need for rehab at that time.  CSW put a sticky note in the chart and also a sign above his bed with family's permission.  Also, family would like to bring pt's dog into the hospital and we explained process, so they will bring him in.  Pt would also like to go outside with family and Dr. Naaman Plummer will write an order for this.   Patient/family response:  Family was very appreciative of the time spent in the meeting and also the work staff has already done with pt.  CSW expressed appreciation for the family for being so supportive of pt and available to the team.  Follow-up/action plans:  Dr. Naaman Plummer to consult Trauma Surgery to follow pt.  CSW put sticky note in EPIC and sign above bed.  Therapists to continue to involve family.

## 2015-12-31 NOTE — Progress Notes (Signed)
[place] PHYSICAL MEDICINE & REHABILITATION     PROGRESS NOTE    Subjective/Complaints: Working with SLP. Denies new pain.   ROS very limited by mental status  Objective: Vital Signs: Blood pressure 110/60, pulse 78, temperature 98.3 F (36.8 C), temperature source Oral, resp. rate 16, weight 78.9 kg (173 lb 14.7 oz), SpO2 99 %. No results found.  Recent Labs  12/28/15 1319  WBC 8.4  HGB 11.1*  HCT 34.4*  PLT 345    Recent Labs  12/28/15 1319  NA 135  K 3.8  CL 103  GLUCOSE 115*  BUN 13  CREATININE 0.67  CALCIUM 9.2   CBG (last 3)  No results for input(s): GLUCAP in the last 72 hours.  Wt Readings from Last 3 Encounters:  12/29/15 78.9 kg (173 lb 14.7 oz)  11/23/15 80 kg (176 lb 5.9 oz)  10/12/15 86.2 kg (190 lb)    Physical Exam:  Constitutional: He appears well-developed. NAD. Eyes: Right eye ptosis.  Conj are normal.  Neck: Normal range of motion. Neck supple.  Trach site healing, small scab present. Cardiovascular: Normal rateand regular rhythm.  Respiratory: Effort normaland breath sounds normal. No respiratory distress.  GI: Soft. Bowel sounds are normal. He exhibits no distension.  PEG tube in place Neurological: He is awake and fairly alert  Patient with decreased safety awareness as a whole.  limited insight. Oriented to self only. ?hospital when given choices. Appears to have expressive language deficits/perseverates.  Moves all 4's inconsistently Sensation appears to be intact to light touch throughout. DTRs 3+ LLE. Skin:  Left splint upper extremity and Hand splint RUE Psych: Unable to assess to due cognition  Assessment/Plan: 1. 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 Physiatrist is providing close team supervision and 24 hour management of active medical problems listed below. Physiatrist  and rehab team continue to assess barriers to discharge/monitor patient progress toward functional and medical goals.  Function:  Bathing Bathing position Bathing activity did not occur: Refused    Bathing parts   Body parts bathed by helper: Right arm, Left arm, Chest, Abdomen, Front perineal area, Buttocks, Right upper leg, Left upper leg, Right lower leg, Left lower leg  Bathing assist        Upper Body Dressing/Undressing Upper body dressing   What is the patient wearing?: Pull over shirt/dress     Pull over shirt/dress - Perfomed by patient: Thread/unthread right sleeve, Thread/unthread left sleeve Pull over shirt/dress - Perfomed by helper: Pull shirt over trunk, Put head through opening        Upper body assist Assist Level:  (mod A)      Lower Body Dressing/Undressing Lower body dressing   What is the patient wearing?: Pants, Underwear, Non-skid slipper socks   Underwear - Performed by helper: Thread/unthread right underwear leg, Thread/unthread left underwear leg, Pull underwear up/down   Pants- Performed by helper: Thread/unthread right pants leg, Thread/unthread left pants leg, Pull pants up/down   Non-skid slipper socks- Performed by helper: Don/doff right sock, Don/doff left sock                  Lower body assist        Toileting Toileting Toileting activity did not occur: No continent bowel/bladder event Toileting steps completed by patient: Adjust clothing after toileting Toileting steps completed by helper: Adjust clothing prior to toileting, Performs perineal hygiene,  Adjust clothing after toileting    Toileting assist Assist level: Two helpers (per [employer], NT)   Transfers Chair/bed transfer   Chair/bed transfer method: Stand pivot Chair/bed transfer assist level: Maximal assist (Pt 25 - 49%/lift and lower)       Locomotion Ambulation     Max distance: 10 ft Assist level: Moderate assist (Pt 50 - 74%)   Wheelchair     Max  wheelchair distance: 25 Assist Level: Supervision or verbal cues  Cognition Comprehension Comprehension assist level: Understands basic less than 25% of the time/ requires cueing >75% of the time  Expression Expression assist level: Expresses basic 25 - 49% of the time/requires cueing 50 - 75% of the time. Uses single words/gestures.  Social Interaction Social Interaction assist level: Interacts appropriately 25 - 49% of time - Needs frequent redirection.  Problem Solving Problem solving assist level: Solves basic less than 25% of the time - needs direction nearly all the time or does not effectively solve problems and may need a restraint for safety  Memory Memory assist level: Recognizes or recalls less than 25% of the time/requires cueing greater than 75% of the time   Medical Problem List and Plan: 1. 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 Status post ORIF of multiple facial fractures 12/08/2015  -continue CIR therapies  -family conference today. Spent time individually speaking with family myself today  -right SPICA splint to allow more use of RUE in therapy 2. DVT Prophylaxis/Anticoagulation: Subcutaneous Lovenox.   -dopplers negative 3. Pain Management: will use tramadol and tylenol for pain.  4. Mood: Seroquel 12.5 mg twice a day, will wean as tolerated  -initiated ritalin trial for attention, 5mg  bid 5. Neuropsych: This patient is notcapable of making decisions on hisown behalf. 6. Skin/Wound Care: Routine skin checks 7. Fluids/Electrolytes/Nutrition: Routine I&O with follow-up chemistries 8.Tracheostomy. Decannulated. Monitor for healing. 9.Dysphagia. Gastrostomy tube 12/03/2015. Currently on a dysphagia #1 honey thick liquid diet.  -f/u MBS per SLP  -would stay with soft diet given facial fractures regardless of how much liquids are advanced 9.Hypertension. Norvasc 10 mg daily, Coreg 12.5 mg twice a day. Monitor with  increased mobility 10.Nondisplaced right radial styloid fracture. Nonweightbearing 11. Comminuted distal left radius fracture Status post closed reduction.. Nonweightbearing--will consult ortho here regarding follow up imaging and management 12..Urinary retention. Flomax 0.8 mg daily. Emptying bladder 13.Constipation. Laxative assistance   LOS (Days) 4 A FACE TO FACE EVALUATION WAS PERFORMED  Marily Konczal T 12/31/2015 10:22 AM

## 2015-12-31 NOTE — Progress Notes (Signed)
Occupational Therapy Session Note  Patient Details  Name: Justin Fox MRN: [id number] Date of Birth: 13-Dec-1960  Today's Date: 12/31/2015 OT Individual Time: 1200-1230 OT Individual Time Calculation (min): 30 min   Short Term Goals: Week 1:  OT Short Term Goal 1 (Week 1): Pt will perform toilet transfer with mod A in order to decrease level of functional transfers.  OT Short Term Goal 2 (Week 1): Pt will engage in 2 minutes of sustained attention during functional task.  OT Short Term Goal 3 (Week 1): Pt will perform grooming tasks with min A. OT Short Term Goal 4 (Week 1): Pt will perform UB dressing with min A.   Skilled Therapeutic Interventions/Progress Updates:   Patient c/o fatigue and wanting to go to bed.   However, his dtr talked him into staying up until after eating lunch which was scheduled after this session ended. The focus of his session ended up regarding focus and attention, balance, toileting and functional mobility for self care.  Patient voiced need to have BM but only had several bouts of gas while seated on toilet.   Patient was able to complete lower body dressing with Min A due to some balance issues after becoming fatigued.       Patient was very 'scattered' with STM deficits and lack of follow through.   His nurse dtr who was present for the session was very good at "refocusing or redirecting" him as/when needed during the session.  Thought patient was highly distracted and impulsive, he exhibited very polite social skills with words such as, "whatever you think I should do," "thank you," gave this clinician a hug and his dtr. A kiss.  Therapy Documentation Precautions:  Precautions Precautions: Fall Restrictions Weight Bearing Restrictions: Yes RUE Weight Bearing: Non weight bearing LUE Weight Bearing: Non weight bearing Other Position/Activity Restrictions: pt. has cast on left forearm, splint on right forearm Pain: voiced "Ouch, ouch" many times, but  his nurse dtr. Stated, "He is fine.   He is just very regularly dramatic."     See Function Navigator for Current Functional Status.  Therapy/Group: Individual Therapy  Alfredia Ferguson [facility] 12/31/2015, 1:23 PM

## 2015-12-31 NOTE — Progress Notes (Signed)
Physical Therapy Session Note  Patient Details  Name: Justin Fox MRN: [id number] Date of Birth: 02/07/1961  Today's Date: 12/31/2015 PT Individual Time: 1005-1030 PT Individual Time Calculation (min): 25 min    Short Term Goals: Week 1:  PT Short Term Goal 1 (Week 1): pt will sustain attention to functional task x 30 seconds with supervision PT Short Term Goal 2 (Week 1): Pt will perform functional transfers consistently with mod A PT Short Term Goal 3 (Week 1): Pt will gait in controlled environment x 25' with mod A    Therapy Documentation Precautions:  Precautions Precautions: Fall Restrictions Weight Bearing Restrictions: Yes RUE Weight Bearing: Non weight bearing LUE Weight Bearing: Non weight bearing Other Position/Activity Restrictions: pt. has cast on left forearm, splint on right forearm  Patient received sitting in recliner chair with quick release belt engaged. Patient performed stand pivot transfer from recliner chair to [facility] mod assist. Patient's daughter present during session for observation. Session focused on on standing tolerance and balance as well visual scanning and coordination. Patient utilized dynavision for 4 trials 15 seconds, 30 seconds, 60 seconds and 30 seconds. Patuient required frequent rest breaks throughout session with max encouragement for participation. Patient perseverative on going outside. Patient was able to be re-directed to participate in tasks with max encouragement from daughter and therapist. Patient returned to room at end of session with daughter present seated in wheelchair with all needs met and quick release belt engaged.    See Function Navigator for Current Functional Status.   Therapy/Group: Individual Therapy  [name] 12/31/2015, 10:46 AM

## 2015-12-31 NOTE — Progress Notes (Signed)
Physical Therapy Session Note  Patient Details  Name: Justin Fox MRN: [id number] Date of Birth: June 01, 1960  Today's Date: 12/30/2015 PT Individual Time:  1630-1700 PT individual Calculation Time: 30 min     Short Term Goals: Week 1:  PT Short Term Goal 1 (Week 1): pt will sustain attention to functional task x 30 seconds with supervision PT Short Term Goal 2 (Week 1): Pt will perform functional transfers consistently with mod A PT Short Term Goal 3 (Week 1): Pt will gait in controlled environment x 25' with mod A  Skilled Therapeutic Interventions/Progress Updates:     Patient received supine in bed and agreeable to PT . Bed mobility for supine to sit EOB with mod Assist from PT to prevent WB through R UE,   Stand pivot transfers and sit<>stand complete x 4 each throughout treatment with min A from PT with max cues for improved awareness to situation, decreased WB through BUE, and improved trunk control.    Stair training instructed by PT with min Assist for safety and max cues for proper use of UE. Patient demonstrated step over step pattern for 12 steps   PT instructed patient Gait training in rehab gym x 49ft with min A. Patient declined additional gait due to reported fatigue and sat on edge of mat table, regardless of cues for target destination Patient required constant redirection to prevent attempting to sleep on mat table to return to room via [facility]    Patient left sitting in [facility] with daughter for assistance with eating dinner.   Therapy Documentation Precautions:  Precautions Precautions: Fall Restrictions Weight Bearing Restrictions: Yes RUE Weight Bearing: Non weight bearing LUE Weight Bearing: Non weight bearing Other Position/Activity Restrictions: pt. has cast on left forearm, splint on right forearm General:   Vital Signs: Therapy Vitals Temp: 98.3 F (36.8 C) Temp Source: Oral Pulse Rate: 78 Resp: 16 BP: 110/60 Patient Position (if appropriate):  Lying Oxygen Therapy SpO2: 99 % O2 Device: Not Delivered   Other Treatments:     See Function Navigator for Current Functional Status.   Therapy/Group: Individual Therapy  Lorie Phenix 12/31/2015, 6:19 AM

## 2015-12-31 NOTE — Progress Notes (Signed)
Speech Language Pathology Daily Session Note  Patient Details  Name: Justin Fox MRN: [id number] Date of Birth: 09/10/1960  Today's Date: 12/31/2015 SLP Individual Time: 0730-0830 SLP Individual Time Calculation (min): 60 min   Short Term Goals: Week 1: SLP Short Term Goal 1 (Week 1): Patient will orient to place and situation with Max A question and visual cues.  SLP Short Term Goal 2 (Week 1): Patient will demonstrate sustained attention to functional tasks for ~1 minute with Max A verbal cues for redirection.  SLP Short Term Goal 3 (Week 1): Patient will follow 1 step commands in 75% of opportunities with Mod A verbal cues.  SLP Short Term Goal 4 (Week 1): Patient will identify 1 cognitive and 1 physical deficit with Max A multimodal cues. SLP Short Term Goal 5 (Week 1): Patient will consume trials of upgraded liquids without overt s/s of aspiration over 2 consecutive sessions prior to repeat MBS.  SLP Short Term Goal 6 (Week 1): Patient will consume current diet with minimal overt s/s of aspiration with Mod A verbal cues for use of swallowing compensatory strategies.   Skilled Therapeutic Interventions: Skilled treatment session focused on cognitive and dysphagia goals. Upon arrival, patient asleep and required Max verbal and tactile cues for alertness. Patient continues to demonstrate language of confusion, however, content appears to be more specific to current situation. Patient also appears to demonstrate increased awareness by reporting, "I don't know what's going on," I don't know what I am supposed to be doing" and "I feel like crap." Patient requested to use the bathroom and required Max-total A multimodal cues for initiation, problem solving and sustained attention for ~15 seconds to task. Patient unable to void, suspect due to decreased attention. Patient also required Max-Total hand over hand assist for self-feeding and required Max verbal cues to attend to bolus in oral cavity  without talking. Patient consumed Dys. 1 textures with honey-thick liquids with minimal overt s/s of aspiration. Recommend patient continue current diet. Patient handed off to PT. Continue with current plan of care.   Function:  Eating Eating   Modified Consistency Diet: Yes Eating Assist Level: Helper feeds patient;More than reasonable amount of time;Set up assist for;Supervision or verbal cues;Hand over hand assist   Eating Set Up Assist For: Opening containers       Cognition Comprehension Comprehension assist level: Understands basic less than 25% of the time/ requires cueing >75% of the time  Expression   Expression assist level: Expresses basic 25 - 49% of the time/requires cueing 50 - 75% of the time. Uses single words/gestures.  Social Interaction Social Interaction assist level: Interacts appropriately 25 - 49% of time - Needs frequent redirection.  Problem Solving Problem solving assist level: Solves basic less than 25% of the time - needs direction nearly all the time or does not effectively solve problems and may need a restraint for safety  Memory Memory assist level: Recognizes or recalls less than 25% of the time/requires cueing greater than 75% of the time    Pain No/Denies Pain   Therapy/Group: Individual Therapy  Katryn Plummer, [place] 12/31/2015, 11:18 AM

## 2016-01-01 ENCOUNTER — Inpatient Hospital Stay (HOSPITAL_COMMUNITY): Payer: 59 | Admitting: Physical Therapy

## 2016-01-01 ENCOUNTER — Inpatient Hospital Stay (HOSPITAL_COMMUNITY): Payer: 59 | Admitting: Speech Pathology

## 2016-01-01 NOTE — Plan of Care (Signed)
Problem: RH BLADDER ELIMINATION Goal: RH STG MANAGE BLADDER WITH ASSISTANCE STG Manage Bladder With max Assistance   Outcome: Not Progressing Requiring I/O caths due to urinary retention

## 2016-01-01 NOTE — Progress Notes (Signed)
[facility] PHYSICAL MEDICINE & REHABILITATION     PROGRESS NOTE    Subjective/Complaints: No new issues. Slept well. Productive family conference yesterday  ROS very limited by mental status  Objective: Vital Signs: Blood pressure 112/72, pulse 84, temperature 97.7 F (36.5 C), temperature source Oral, resp. rate 16, weight 78.9 kg (173 lb 14.7 oz), SpO2 98 %. No results found. No results for input(s): WBC, HGB, HCT, PLT in the last 72 hours. No results for input(s): NA, K, CL, GLUCOSE, BUN, CREATININE, CALCIUM in the last 72 hours.  Invalid input(s): CO CBG (last 3)  No results for input(s): GLUCAP in the last 72 hours.  Wt Readings from Last 3 Encounters:  12/29/15 78.9 kg (173 lb 14.7 oz)  11/23/15 80 kg (176 lb 5.9 oz)  10/12/15 86.2 kg (190 lb)    Physical Exam:  Constitutional: He appears well-developed. NAD. Eyes: Right eye ptosis.  Conj are normal.  Neck: Normal range of motion. Neck supple.  Trach site healing, small scab still present. Cardiovascular: Normal rateand regular rhythm.  Respiratory: Effort normaland breath sounds normal. No respiratory distress.  GI: Soft. Bowel sounds are normal. He exhibits no distension.  PEG tube in place Neurological: He is awake and fairly alert  Patient with decreased safety awareness as a whole.  limited insight. Oriented to self only, occasionally hospital. Perseverative. Language of confusion. Moves all 4's inconsistently Sensation appears to be intact to light touch throughout. DTRs 3+ LLE. Skin:  Left splint upper extremity and Hand splint RUE Psych: Unable to assess to due cognition  Assessment/Plan: 1. 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 Physiatrist is providing close team supervision and 24 hour management of active medical problems listed below. Physiatrist and  rehab team continue to assess barriers to discharge/monitor patient progress toward functional and medical goals.  Function:  Bathing Bathing position Bathing activity did not occur: Refused Position: Wheelchair/chair at sink  Bathing parts Body parts bathed by patient: Right arm, Left arm, Chest, Abdomen, Front perineal area Body parts bathed by helper: Buttocks, Right upper leg, Left upper leg, Right lower leg, Left lower leg  Bathing assist        Upper Body Dressing/Undressing Upper body dressing   What is the patient wearing?: Pull over shirt/dress     Pull over shirt/dress - Perfomed by patient: Thread/unthread right sleeve, Put head through opening, Pull shirt over trunk Pull over shirt/dress - Perfomed by helper: Thread/unthread left sleeve        Upper body assist Assist Level: Touching or steadying assistance(Pt > 75%)      Lower Body Dressing/Undressing Lower body dressing   What is the patient wearing?: Underwear, Pants   Underwear - Performed by helper: Thread/unthread right underwear leg, Thread/unthread left underwear leg, Pull underwear up/down   Pants- Performed by helper: Thread/unthread right pants leg, Pull pants up/down   Non-skid slipper socks- Performed by helper: Don/doff right sock, Don/doff left sock                  Lower body assist        Toileting Toileting Toileting activity did not occur: No continent bowel/bladder event Toileting steps completed by patient: Adjust clothing after toileting Toileting steps completed by helper: Adjust clothing prior to toileting, Performs perineal hygiene, Adjust clothing after toileting    Toileting assist Assist level: Two helpers (per [employer], NT)  Transfers Chair/bed transfer   Chair/bed transfer method: Stand pivot Chair/bed transfer assist level: Maximal assist (Pt 25 - 49%/lift and lower)       Locomotion Ambulation     Max distance: 10 ft Assist level: Moderate assist (Pt 50 -  74%)   Wheelchair     Max wheelchair distance: 25 Assist Level: Supervision or verbal cues  Cognition Comprehension Comprehension assist level: Understands basic less than 25% of the time/ requires cueing >75% of the time  Expression Expression assist level: Expresses basic 25 - 49% of the time/requires cueing 50 - 75% of the time. Uses single words/gestures.  Social Interaction Social Interaction assist level: Interacts appropriately 25 - 49% of time - Needs frequent redirection.  Problem Solving Problem solving assist level: Solves basic less than 25% of the time - needs direction nearly all the time or does not effectively solve problems and may need a restraint for safety  Memory Memory assist level: Recognizes or recalls less than 25% of the time/requires cueing greater than 75% of the time   Medical Problem List and Plan: 1. 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 Status post ORIF of multiple facial fractures 12/08/2015  -continue CIR therapies  -family conference yesterday  -right SPICA splint to allow more use of RUE in therapy 2. DVT Prophylaxis/Anticoagulation: Subcutaneous Lovenox.   -dopplers negative 3. Pain Management: will use tramadol and tylenol for pain.  4. Mood: Seroquel 12.5 mg twice a day, will wean as tolerated  -initiated ritalin trial for attention, 5mg  bid 5. Neuropsych: This patient is notcapable of making decisions on hisown behalf. 6. Skin/Wound Care: Routine skin checks 7. Fluids/Electrolytes/Nutrition: Routine I&O with follow-up chemistries 8.Tracheostomy. Decannulated. Monitor for healing. 9.Dysphagia. Gastrostomy tube 12/03/2015. Currently on a dysphagia #1 honey thick liquid diet.  -f/u MBS per SLP  -would stay with soft diet given facial fractures regardless of how much liquids are advanced 9.Hypertension. Norvasc 10 mg daily, Coreg 12.5 mg twice a day. Monitor with increased  mobility 10.Nondisplaced right radial styloid fracture. Nonweightbearing 11. Comminuted distal left radius fracture Status post closed reduction.. Nonweightbearing--will consult ortho here regarding follow up imaging and management 12..Urinary retention. Flomax 0.8 mg daily. Emptying bladder for the most part 13.Constipation. Laxative assistance   LOS (Days) 5 A FACE TO FACE EVALUATION WAS PERFORMED  Natelie Ostrosky T 01/01/2016 10:17 AM

## 2016-01-01 NOTE — Progress Notes (Signed)
Speech Language Pathology Daily Session Note  Patient Details  Name: Justin Fox MRN: [id number] Date of Birth: 12-09-1960  Today's Date: 01/01/2016 SLP Individual Time: 1020-1100 SLP Individual Time Calculation (min): 40 min   Short Term Goals:Week 1: SLP Short Term Goal 1 (Week 1): Patient will orient to place and situation with Max A question and visual cues.  SLP Short Term Goal 2 (Week 1): Patient will demonstrate sustained attention to functional tasks for ~1 minute with Max A verbal cues for redirection.  SLP Short Term Goal 3 (Week 1): Patient will follow 1 step commands in 75% of opportunities with Mod A verbal cues.  SLP Short Term Goal 4 (Week 1): Patient will identify 1 cognitive and 1 physical deficit with Max A multimodal cues. SLP Short Term Goal 5 (Week 1): Patient will consume trials of upgraded liquids without overt s/s of aspiration over 2 consecutive sessions prior to repeat MBS.  SLP Short Term Goal 6 (Week 1): Patient will consume current diet with minimal overt s/s of aspiration with Mod A verbal cues for use of swallowing compensatory strategies.   Skilled Therapeutic Interventions: Pt was seen for skilled ST targeting cognitive and dysphagia goals.  Pt slightly restless upon arrival but easily redirectable to structured and unstructured tasks for brief periods of time.  Pt consumed dys 1 textures and honey thick liquids with mod-max assist multimodal cues for rate and portion control due to impulsivity.  Pt with intermittently wet vocal quality following large boluses but was able to correct with mod verbal cues for throat clear followed by extra swallows.  Pt was able to sustain his attention to a very basic sorting task for up to 1 minute intervals with max assist verbal cues.   Orientation information frequently reiterated throughout therapy session to facilitate carryover.  Pt was left in bed with bed alarm set and call bell within reach.  Continue per current plan of  care.       Function:  Eating Eating   Modified Consistency Diet: Yes Eating Assist Level: Help managing cup/glass;Supervision or verbal cues           Cognition Comprehension Comprehension assist level: Understands basic 50 - 74% of the time/ requires cueing 25 - 49% of the time  Expression   Expression assist level: Expresses basic 50 - 74% of the time/requires cueing 25 - 49% of the time. Needs to repeat parts of sentences.  Social Interaction Social Interaction assist level: Interacts appropriately 25 - 49% of time - Needs frequent redirection.  Problem Solving Problem solving assist level: Solves basic 25 - 49% of the time - needs direction more than half the time to initiate, plan or complete simple activities  Memory Memory assist level: Recognizes or recalls less than 25% of the time/requires cueing greater than 75% of the time    Pain Pain Assessment Pain Assessment: No/denies pain   Therapy/Group: Individual Therapy  Shah Insley, Selinda Orion 01/01/2016, 12:51 PM

## 2016-01-02 ENCOUNTER — Inpatient Hospital Stay (HOSPITAL_COMMUNITY): Payer: 59 | Admitting: Occupational Therapy

## 2016-01-02 ENCOUNTER — Inpatient Hospital Stay (HOSPITAL_COMMUNITY): Payer: 59 | Admitting: Physical Therapy

## 2016-01-02 ENCOUNTER — Inpatient Hospital Stay (HOSPITAL_COMMUNITY): Payer: 59

## 2016-01-02 DIAGNOSIS — R1313 Dysphagia, pharyngeal phase: Secondary | ICD-10-CM

## 2016-01-02 MED ORDER — METHYLPHENIDATE HCL 5 MG PO TABS
10.0000 mg | ORAL_TABLET | Freq: Two times a day (BID) | ORAL | Status: DC
  Administered 2016-01-02 – 2016-01-25 (×46): 10 mg via ORAL
  Filled 2016-01-02 (×47): qty 2

## 2016-01-02 NOTE — Progress Notes (Signed)
Occupational Therapy Session Note  Patient Details  Name: Justin Fox MRN: [id number] Date of Birth: 24-May-1960  Today's Date: 01/02/2016 OT Individual Time: 0800-0900 OT Individual Time Calculation (min): 60 min     Short Term Goals:Week 1:  OT Short Term Goal 1 (Week 1): Pt will perform toilet transfer with mod A in order to decrease level of functional transfers.  OT Short Term Goal 2 (Week 1): Pt will engage in 2 minutes of sustained attention during functional task.  OT Short Term Goal 3 (Week 1): Pt will perform grooming tasks with min A. OT Short Term Goal 4 (Week 1): Pt will perform UB dressing with min A.   Skilled Therapeutic Interventions/Progress Updates:    Pt seen for OT session focusing on ADL re-training, orientation, and following basic commands. Pt in supine upon arrival, oriented to hospital, however, not to city or day. Pt perseverative throughout session regarding having to go to the bathroom and age of therapist. Attempted continuously throughout session to have pt transfer to EOB for dressing tasks, however, pt unable to follow basic commands. With tactile cuing for sequencing, pt came to sitting EOB twice during session, however, once EOB pt immediately returned to supine. Attempted to have pt dress in supine, however, required max A for clothing orientation and refused rolling or standing to have pants pulled up.  Pt with decreased attention to task throughout session and requiring continual cuing for orientation and directions for task. Pt left supine at end of session with all needs met and bed alarm on. Educated regarding use of call bell for assist.   Therapy Documentation Precautions:  Precautions Precautions: Fall Restrictions Weight Bearing Restrictions: Yes RUE Weight Bearing: Non weight bearing LUE Weight Bearing: Non weight bearing Other Position/Activity Restrictions: pt. has cast on left forearm, splint on right forearm Pain: Pain  Assessment Pain Assessment: No/ denies pain  See Function Navigator for Current Functional Status.   Therapy/Group: Individual Therapy  Lewis, Arie Powell C 01/02/2016, 6:38 AM

## 2016-01-02 NOTE — Progress Notes (Signed)
Occupational Therapy Session Note  Patient Details  Name: NOAAH SCHUELE MRN: [id number] Date of Birth: 1960/07/01  Today's Date: 01/02/2016 OT Individual Time: 1130-1200 OT Individual Time Calculation (min): 30 min   Short Term Goals: Week 1:  OT Short Term Goal 1 (Week 1): Pt will perform toilet transfer with mod A in order to decrease level of functional transfers.  OT Short Term Goal 2 (Week 1): Pt will engage in 2 minutes of sustained attention during functional task.  OT Short Term Goal 3 (Week 1): Pt will perform grooming tasks with min A. OT Short Term Goal 4 (Week 1): Pt will perform UB dressing with min A.   Skilled Therapeutic Interventions/Progress Updates:   ADL-retraining at shower level with emphasis on focused attention, shower transfer, and dressing skills.   Pt received supine in bed with daughter present and patient initially unwilling to participate in session but redirected to engage with multimodal cues from OT and daughter.   With extra time to motivate pt, he was willing to attempt shower in prep for visit from his wife but required max assist to rise to EOB and moderate assist to transfer to w/c and then to bench in bathroom.   After setup to provide barrier to cast and IV, pt required max assist to doff clothing and to sustain interest in shower.   Pt sustained showering for 3 minutes before requesting exit from shower and accepting assist to dress from OT and daughter.   Pt was overall max assist to don shirt, brief and pants.   Pt returned to bed at end of session with max assist to reposition to head of bed.  Therapy Documentation Precautions:  Precautions Precautions: Fall Restrictions Weight Bearing Restrictions: Yes RUE Weight Bearing: Non weight bearing LUE Weight Bearing: Non weight bearing Other Position/Activity Restrictions: pt. has cast on left forearm, splint on right forearm  Vital Signs: Therapy Vitals Temp: 97.5 F (36.4 C) Temp Source:  Oral Pulse Rate: 90 Resp: 18 BP: 117/79 Patient Position (if appropriate): Lying Oxygen Therapy SpO2: 99 % O2 Device: Not Delivered   Pain: Variable response to repositioning from supine in bed and engage in session; difficult to assess pain or hypersensitivity.    See Function Navigator for Current Functional Status.   Therapy/Group: Individual Therapy  Lesly Pontarelli 01/02/2016, 4:17 PM

## 2016-01-02 NOTE — Progress Notes (Addendum)
[facility] PHYSICAL MEDICINE & REHABILITATION     PROGRESS NOTE    Subjective/Complaints: Slept well. Just waking up. Denies pain. In no distress  ROS very limited by mental status  Objective: Vital Signs: Blood pressure 115/64, pulse 77, temperature 98.5 F (36.9 C), temperature source Oral, resp. rate 18, weight 78.9 kg (173 lb 14.7 oz), SpO2 97 %. No results found. No results for input(s): WBC, HGB, HCT, PLT in the last 72 hours. No results for input(s): NA, K, CL, GLUCOSE, BUN, CREATININE, CALCIUM in the last 72 hours.  Invalid input(s): CO CBG (last 3)  No results for input(s): GLUCAP in the last 72 hours.  Wt Readings from Last 3 Encounters:  12/29/15 78.9 kg (173 lb 14.7 oz)  11/23/15 80 kg (176 lb 5.9 oz)  10/12/15 86.2 kg (190 lb)    Physical Exam:  Constitutional: He appears well-developed. NAD. Eyes: Right eye ptosis.  Conj are normal.  Neck: Normal range of motion. Neck supple.  Trach site healing, small scab still present. Cardiovascular: Normal rateand regular rhythm.  Respiratory: Effort normaland breath sounds normal. No respiratory distress.  GI: Soft. Bowel sounds are normal. He exhibits no distension.  PEG tube in place Neurological: He is awake and fairly alert  Patient with decreased safety awareness as a whole.  limited insight. Oriented to self only, occasionally hospital. Perseverative. Language of confusion. Moves all 4's inconsistently Sensation appears to be intact to light touch throughout. DTRs 3+ LLE. Skin:  Left splint upper extremity and Hand splint RUE Psych: Unable to assess to due cognition  Assessment/Plan: 1. 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 Physiatrist is providing close team supervision and 24 hour management of active medical problems listed below. Physiatrist and rehab team  continue to assess barriers to discharge/monitor patient progress toward functional and medical goals.  Function:  Bathing Bathing position Bathing activity did not occur: Refused Position: Wheelchair/chair at sink  Bathing parts Body parts bathed by patient: Right arm, Left arm, Chest, Abdomen, Front perineal area Body parts bathed by helper: Buttocks, Right upper leg, Left upper leg, Right lower leg, Left lower leg  Bathing assist        Upper Body Dressing/Undressing Upper body dressing   What is the patient wearing?: Pull over shirt/dress     Pull over shirt/dress - Perfomed by patient: Thread/unthread right sleeve, Put head through opening, Pull shirt over trunk Pull over shirt/dress - Perfomed by helper: Thread/unthread left sleeve        Upper body assist Assist Level: Touching or steadying assistance(Pt > 75%)      Lower Body Dressing/Undressing Lower body dressing   What is the patient wearing?: Underwear, Pants   Underwear - Performed by helper: Thread/unthread right underwear leg, Thread/unthread left underwear leg, Pull underwear up/down   Pants- Performed by helper: Thread/unthread right pants leg, Pull pants up/down   Non-skid slipper socks- Performed by helper: Don/doff right sock, Don/doff left sock                  Lower body assist        Toileting Toileting Toileting activity did not occur: No continent bowel/bladder event Toileting steps completed by patient: Adjust clothing after toileting Toileting steps completed by helper: Adjust clothing prior to toileting, Performs perineal hygiene, Adjust clothing after toileting    Toileting assist Assist level: Two helpers (per [employer],  NT)   Transfers Chair/bed transfer   Chair/bed transfer method: Stand pivot Chair/bed transfer assist level: Maximal assist (Pt 25 - 49%/lift and lower)       Locomotion Ambulation     Max distance: 10 ft Assist level: Moderate assist (Pt 50 - 74%)    Wheelchair     Max wheelchair distance: 25 Assist Level: Supervision or verbal cues  Cognition Comprehension Comprehension assist level: Understands basic less than 25% of the time/ requires cueing >75% of the time  Expression Expression assist level: Expresses basic 25 - 49% of the time/requires cueing 50 - 75% of the time. Uses single words/gestures.  Social Interaction Social Interaction assist level: Interacts appropriately 25 - 49% of time - Needs frequent redirection.  Problem Solving Problem solving assist level: Solves basic less than 25% of the time - needs direction nearly all the time or does not effectively solve problems and may need a restraint for safety  Memory Memory assist level: Recognizes or recalls less than 25% of the time/requires cueing greater than 75% of the time   Medical Problem List and Plan: 1. 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 Status post ORIF of multiple facial fractures 12/08/2015  -continue CIR therapies  -right SPICA splint to allow more use of RUE in therapy 2. DVT Prophylaxis/Anticoagulation: Subcutaneous Lovenox.   -dopplers negative 3. Pain Management: will use tramadol and tylenol for pain.  4. Mood: Seroquel 12.5 mg twice a day, will wean as tolerated  -initiated ritalin trial for attention, 5mg  bid---increase to 10mg  5. Neuropsych: This patient is notcapable of making decisions on hisown behalf. 6. Skin/Wound Care: Routine skin checks 7. Fluids/Electrolytes/Nutrition: PO intake pretty good 90-100% 8.Tracheostomy. Decannulated. Monitor for healing. 9.Dysphagia. Gastrostomy tube 12/03/2015. Currently on a dysphagia #1 honey thick liquid diet.  -f/u MBS per SLP  -would stay with soft diet given facial fractures regardless of how much liquids are advanced 9.Hypertension. Norvasc 10 mg daily, Coreg 12.5 mg twice a day. Monitor with increased mobility 10.Nondisplaced right radial  styloid fracture. Nonweightbearing 11. Comminuted distal left radius fracture Status post closed reduction.. Nonweightbearing--will consult ortho here this week regarding follow up imaging and management 12..Urinary retention. Flomax 0.8 mg daily.   -requiring intermittent I/O caths  -keflex for E Coli UTI 13.Constipation. Laxative assistance   LOS (Days) 6 A FACE TO FACE EVALUATION WAS PERFORMED  SWARTZ,ZACHARY T 01/02/2016 9:50 AM

## 2016-01-02 NOTE — Progress Notes (Signed)
Physical Therapy Session Note  Patient Details  Name: Justin Fox MRN: [id number] Date of Birth: October 15, 1960  Today's Date: 01/02/2016 PT Individual Time: [id number] and [id number] PT Individual Time Calculation (min): 58 min and 43 minutes   Short Term Goals: Week 1:  PT Short Term Goal 1 (Week 1): pt will sustain attention to functional task x 30 seconds with supervision PT Short Term Goal 2 (Week 1): Pt will perform functional transfers consistently with mod A PT Short Term Goal 3 (Week 1): Pt will gait in controlled environment x 25' with mod A  Skilled Therapeutic Interventions/Progress Updates:    Treatment 1: Pt received in w/c with daughter present for session. Pt noted he was "hurting everywhere" and in his "head"; RN made aware & administered pain medication. Session focused on cognitive remediation & sustaining attention to ask. Pt required max multimodal cuing to attempt to assemble most basic pipe tree shape however pt with limited engagement in activity. Using bean bags with food labeled on them pt able to state for which meal each food could be served correctly 50% of the time when given a choice of two. Pt required max cuing for attention to ask as pt perseverated on "I've got to get up. I've got to go to the bathroom". Pt ambulated dayroom>bathroom in room with min/mod assist overall and min/mod assist for toilet transfers for safety & cuing (NWB BUE) but pt unable to void or have BM. At end of session pt left in bed with all needs within reach & daughter present. Pt noted to be fatigued on this date.   Treatment 2: Pt received in bed with wife present & agreeable to tx. Pt noted pain in gut and "I feel like crap". Pt's wife exited shortly upon PT's arrival. Pt transferred bed>w/c with ambulation with mod assist & max cuing for safety. Pt demonstrates inconsistent step width & length, impulsiveness & reduced safety awareness overall. Pt propelled w/c to gym with BLE & cuing. Pt  participated in assembling pipe tree with PT pre-selecting appropriate pieces; pt required max cuing to attend to task as he repeatedly stated "I have to go to the bathroom". Utilized dynavision with pt attending to task for max of 20 seconds with encouragement to continue participating. Back in room pt ambulated w/c>bathroom with mod assist, mod assist with managing clothing and + void. Therapist attempted to passively orient pt to month & time of year. At end of session pt left in bed with all needs within reach & alarm set.   Therapy Documentation Precautions:  Precautions Precautions: Fall Restrictions Weight Bearing Restrictions: Yes RUE Weight Bearing: Non weight bearing LUE Weight Bearing: Non weight bearing Other Position/Activity Restrictions: pt. has cast on left forearm, splint on right forearm   See Function Navigator for Current Functional Status.   Therapy/Group: Individual Therapy  [name] 01/02/2016, 12:49 PM

## 2016-01-03 ENCOUNTER — Inpatient Hospital Stay (HOSPITAL_COMMUNITY): Payer: 59 | Admitting: Occupational Therapy

## 2016-01-03 ENCOUNTER — Inpatient Hospital Stay (HOSPITAL_COMMUNITY): Payer: 59 | Admitting: Physical Therapy

## 2016-01-03 ENCOUNTER — Inpatient Hospital Stay (HOSPITAL_COMMUNITY): Payer: 59 | Admitting: Speech Pathology

## 2016-01-03 DIAGNOSIS — R1312 Dysphagia, oropharyngeal phase: Secondary | ICD-10-CM

## 2016-01-03 NOTE — Progress Notes (Signed)
Occupational Therapy Session Note  Patient Details  Name: Justin Fox MRN: [id number] Date of Birth: 09-19-1960  Today's Date: 01/03/2016 OT Individual Time: [id number] and [id number] OT Individual Time Calculation (min): 54 min and 55 minutes 35 missed minutes in afternoon session    Short Term Goals: Week 1:  OT Short Term Goal 1 (Week 1): Pt will perform toilet transfer with mod A in order to decrease level of functional transfers.  OT Short Term Goal 2 (Week 1): Pt will engage in 2 minutes of sustained attention during functional task.  OT Short Term Goal 3 (Week 1): Pt will perform grooming tasks with min A. OT Short Term Goal 4 (Week 1): Pt will perform UB dressing with min A.   Skilled Therapeutic Interventions/Progress Updates:    Session 1: Upon entering the room, pt supine in bed with wife present in room. Pt with no c/o pain this session. Pt required max tactile and verbal cues for mobility tasks this session as wished to return to bed multiple times. Pt declined bathing but donned UB clothing with set up A. Pt donned LB clothing with mod A with sit <>stand from bed. Pt needing max A for sit <>stand and steady assist for balance as he managed clothing items. Pt ambulated without AD and mod A into bathroom as he perseverates on using toilet. Pt sits on toilet with max A for lifting and lowering but then declines using. Pt transfers to wheelchair and propels self with B LEs 100' with max cues and increased time. OT places quick release belt around pt and he continues to propel with encouragement from wife.   Session 2: Upon entering the room, pt supine in bed with c/o headache and grimacing. OT notified RN who gave pain medication this session. OT attempting to coax pt out of bed but pt refusing to move from supine position. OT provided pictures of family members from home. Pt smiling when looking at pictures, one at a time, and then correctly verbalizing name of each person with  increased time and min verbal guidance cues. Pt sitting up in bed to receive thickened liquids but declined lunch tray. Pt continues to complain or headache and states, "I feel like crap." Pt resting in bed with bed alarm activated and call bell within reach.   Therapy Documentation Precautions:  Precautions Precautions: Fall Restrictions Weight Bearing Restrictions: Yes RUE Weight Bearing: Non weight bearing LUE Weight Bearing: Non weight bearing Other Position/Activity Restrictions: pt. has cast on left forearm, splint on right forearm Vital Signs: Therapy Vitals Pulse Rate: 78 BP: 127/80  See Function Navigator for Current Functional Status.   Therapy/Group: Individual Therapy  Phineas Semen 01/03/2016, 10:00 AM

## 2016-01-03 NOTE — Progress Notes (Signed)
Speech Language Pathology Daily Session Note  Patient Details  Name: Justin Fox MRN: [id number] Date of Birth: 1960-12-22  Today's Date: 01/03/2016 SLP Individual Time: 0830-0930 SLP Individual Time Calculation (min): 60 min   Short Term Goals: Week 1: SLP Short Term Goal 1 (Week 1): Patient will orient to place and situation with Max A question and visual cues.  SLP Short Term Goal 2 (Week 1): Patient will demonstrate sustained attention to functional tasks for ~1 minute with Max A verbal cues for redirection.  SLP Short Term Goal 3 (Week 1): Patient will follow 1 step commands in 75% of opportunities with Mod A verbal cues.  SLP Short Term Goal 4 (Week 1): Patient will identify 1 cognitive and 1 physical deficit with Max A multimodal cues. SLP Short Term Goal 5 (Week 1): Patient will consume trials of upgraded liquids without overt s/s of aspiration over 2 consecutive sessions prior to repeat MBS.  SLP Short Term Goal 6 (Week 1): Patient will consume current diet with minimal overt s/s of aspiration with Mod A verbal cues for use of swallowing compensatory strategies.   Skilled Therapeutic Interventions: Skilled treatment session focused on dysphagia and cognitive goals. Patient consumed breakfast meal of Dys. 1 textures with honey-thick liquids and demonstrated an intermittent wet vocal quality that patient cleared with Max A verbal cues. Patient's wife present during meal and provided appropriate cueing for initiation with self-feeding and attention to task. Overall, patient required Max A multimodal cues and extra time. SLP facilitated session by providing Max A verbal and visual cues for initiation and sustained attention for ~30-60 seconds during a basic calendar making task and card task in which patient had to identify the highest card with 75% accuracy. Patient perseverative on using the bathroom and was able to have a bowel movement X 2 attempts, suspect patient not completly  emptying due to decreased attention to task causing him to have to use the bathroom multiple times within a short period of time. Patient continues with language of confusion and required total A for orientation to place, time and situation. Patient transferred back to bed at end of session and left supine with all needs within reach and bed alarm on. Continue with current plan of care.   Function:  Eating Eating   Modified Consistency Diet: Yes Eating Assist Level: More than reasonable amount of time;Supervision or verbal cues;Helper scoops food on utensil;Set up assist for;Help managing cup/glass   Eating Set Up Assist For: Opening containers Helper Scoops Food on Utensil: Every scoop     Cognition Comprehension Comprehension assist level: Understands basic less than 25% of the time/ requires cueing >75% of the time  Expression   Expression assist level: Expresses basic 25 - 49% of the time/requires cueing 50 - 75% of the time. Uses single words/gestures.  Social Interaction Social Interaction assist level: Interacts appropriately 25 - 49% of time - Needs frequent redirection.  Problem Solving Problem solving assist level: Solves basic less than 25% of the time - needs direction nearly all the time or does not effectively solve problems and may need a restraint for safety  Memory Memory assist level: Recognizes or recalls less than 25% of the time/requires cueing greater than 75% of the time    Pain No reports of pain   Therapy/Group: Individual Therapy  Dalaney Needle 01/03/2016, 10:51 AM

## 2016-01-03 NOTE — Progress Notes (Signed)
Physical Therapy Session Note  Patient Details  Name: Justin Fox MRN: [id number] Date of Birth: 1960/09/30  Today's Date: 01/03/2016 PT Individual Time: [id number] PT Individual Time Calculation (min): 59 min    Short Term Goals: Week 1:  PT Short Term Goal 1 (Week 1): pt will sustain attention to functional task x 30 seconds with supervision PT Short Term Goal 2 (Week 1): Pt will perform functional transfers consistently with mod A PT Short Term Goal 3 (Week 1): Pt will gait in controlled environment x 25' with mod A  Skilled Therapeutic Interventions/Progress Updates:    Pt received in bed noting pain at Peg tube insertion site. Pt noted to be incontinent of urine and required total assist to doff soiled & don new shirt. Pt ambulated bed>bathroom with Mod assist and inconsistent gait pattern & required total assist to don clean brief & new pants. Pt propelled w/c x 125 ft with BLE room>dayroom with supervision and max cuing to attend to task. Session focused on attention to task, requiring pt to sort bean bags by color and then select fruits from choice of two (correct 2 out of 3). Pt required max cuing to attend to task with attention span only lasting ~20 seconds. Pt perseverated on "I need to go to the bathroom" and "I need to sit" even when sitting in w/c & max education/redirection. Throughout session pt ambulated 15 ft + 15 ft with mod assist overall due to increased lateral sway, decreased balance, and inconsistent step length & width. At end of session pt returned to room and transferred to toilet but (-) BM. Pt left in w/c with QRB in place & with daughter.   Pt continues to demonstrate significantly impaired safety awareness and continues to utilize BUE to push up to standing even with max cuing not to do so. Throughout session therapist passively educated pt on need to wear casts/braces to allow BUE to heal.   Therapy Documentation Precautions:  Precautions Precautions:  Fall Restrictions Weight Bearing Restrictions: Yes RUE Weight Bearing: Non weight bearing LUE Weight Bearing: Non weight bearing Other Position/Activity Restrictions: pt. has cast on left forearm, splint on right forearm  See Function Navigator for Current Functional Status.   Therapy/Group: Speed 01/03/2016, 5:22 PM

## 2016-01-03 NOTE — Progress Notes (Signed)
[facility] PHYSICAL MEDICINE & REHABILITATION     PROGRESS NOTE    Subjective/Complaints: Up rolling in hall with wife. Very alert. Had good weekend. A little wound up this morning.   ROS very limited by mental status  Objective: Vital Signs: Blood pressure 127/80, pulse 78, temperature 97.8 F (36.6 C), temperature source Oral, resp. rate 18, weight 78.9 kg (173 lb 14.7 oz), SpO2 97 %. No results found. No results for input(s): WBC, HGB, HCT, PLT in the last 72 hours. No results for input(s): NA, K, CL, GLUCOSE, BUN, CREATININE, CALCIUM in the last 72 hours.  Invalid input(s): CO CBG (last 3)  No results for input(s): GLUCAP in the last 72 hours.  Wt Readings from Last 3 Encounters:  12/29/15 78.9 kg (173 lb 14.7 oz)  11/23/15 80 kg (176 lb 5.9 oz)  10/12/15 86.2 kg (190 lb)    Physical Exam:  Constitutional: He appears well-developed. NAD. Eyes: Right eye ptosis.  Conj are normal.  Neck: Normal range of motion. Neck supple.  Trach site healing, small scab still present. Cardiovascular: Normal rateand regular rhythm.  Respiratory: Effort normaland breath sounds normal. No respiratory distress.  GI: Soft. Bowel sounds are normal. He exhibits no distension.  PEG tube in place Neurological: He is awake and fairly alert  Patient with decreased safety awareness as a whole.  limited insight. Oriented to self only, occasionally hospital. Perseverative. Language of confusion. Moves all 4's inconsistently Sensation appears to be intact to light touch throughout. DTRs 3+ LLE. Skin:  Left splint upper extremity and Hand splint RUE Psych: Unable to assess to due cognition  Assessment/Plan: 1. 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 Physiatrist is providing close team supervision and 24 hour management of active medical problems  listed below. Physiatrist and rehab team continue to assess barriers to discharge/monitor patient progress toward functional and medical goals.  Function:  Bathing Bathing position Bathing activity did not occur: Refused Position: Production manager parts bathed by patient: Right arm, Left arm, Chest Body parts bathed by helper: Back  Bathing assist Assist Level: Touching or steadying assistance(Pt > 75%)      Upper Body Dressing/Undressing Upper body dressing   What is the patient wearing?: Pull over shirt/dress     Pull over shirt/dress - Perfomed by patient: Thread/unthread right sleeve, Put head through opening, Thread/unthread left sleeve Pull over shirt/dress - Perfomed by helper: Pull shirt over trunk        Upper body assist Assist Level: Touching or steadying assistance(Pt > 75%)      Lower Body Dressing/Undressing Lower body dressing   What is the patient wearing?: Pants   Underwear - Performed by helper: Thread/unthread right underwear leg, Thread/unthread left underwear leg, Pull underwear up/down   Pants- Performed by helper: Thread/unthread right pants leg, Thread/unthread left pants leg (Pt refused to have pants pulled up)   Non-skid slipper socks- Performed by helper: Don/doff right sock, Don/doff left sock                  Lower body assist        Toileting Toileting Toileting activity did not occur: No continent bowel/bladder event Toileting steps completed by patient: Adjust clothing after toileting Toileting steps completed by helper: Adjust clothing prior to toileting, Performs perineal hygiene, Adjust clothing after toileting Toileting Assistive Devices: Grab bar or rail  Toileting  assist Assist level: Two helpers (per [employer], NT)   Transfers Chair/bed transfer   Chair/bed transfer method: Stand pivot Chair/bed transfer assist level: Maximal assist (Pt 25 - 49%/lift and lower)       Locomotion Ambulation     Max  distance: 100 ft Assist level: Moderate assist (Pt 50 - 74%)   Wheelchair   Type: Manual Max wheelchair distance: 100 ft (BLE) Assist Level: Supervision or verbal cues  Cognition Comprehension Comprehension assist level: Understands basic less than 25% of the time/ requires cueing >75% of the time  Expression Expression assist level: Expresses basic 25 - 49% of the time/requires cueing 50 - 75% of the time. Uses single words/gestures.  Social Interaction Social Interaction assist level: Interacts appropriately 25 - 49% of time - Needs frequent redirection.  Problem Solving Problem solving assist level: Solves basic less than 25% of the time - needs direction nearly all the time or does not effectively solve problems and may need a restraint for safety  Memory Memory assist level: Recognizes or recalls less than 25% of the time/requires cueing greater than 75% of the time   Medical Problem List and Plan: 1. 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 Status post ORIF of multiple facial fractures 12/08/2015  -continue CIR therapies  -right SPICA splint to allow more use of RUE in therapy 2. DVT Prophylaxis/Anticoagulation: Subcutaneous Lovenox.   -dopplers negative 3. Pain Management: will use tramadol and tylenol for pain.  4. Mood: Seroquel 12.5 mg twice a day--titrate if needed  -ritalin-increased to 10mg ---hold if excessive agitation 5. Neuropsych: This patient is notcapable of making decisions on hisown behalf. 6. Skin/Wound Care: Routine skin checks 7. Fluids/Electrolytes/Nutrition: PO intake pretty good 90-100% 8.Tracheostomy. Decannulated. Monitor for healing. 9.Dysphagia. Gastrostomy tube 12/03/2015. Currently on a dysphagia #1 honey thick liquid diet.  -f/u MBS per SLP  -would stay with soft diet given facial fractures regardless of how much liquids are advanced 9.Hypertension. Norvasc 10 mg daily, Coreg 12.5 mg twice a  day. Monitor with increased mobility 10.Nondisplaced right radial styloid fracture. Nonweightbearing 11. Comminuted distal left radius fracture Status post closed reduction.. Nonweightbearing--will consult ortho here this week regarding follow up imaging and management 12..Urinary retention. Flomax 0.8 mg daily.   -requiring intermittent I/O caths  -keflex for E Coli UTI 13.Constipation. Laxative assistance   LOS (Days) 7 A FACE TO FACE EVALUATION WAS PERFORMED  Jaceion Aday T 01/03/2016 9:20 AM

## 2016-01-04 ENCOUNTER — Inpatient Hospital Stay (HOSPITAL_COMMUNITY): Payer: 59 | Admitting: Physical Therapy

## 2016-01-04 ENCOUNTER — Inpatient Hospital Stay (HOSPITAL_COMMUNITY): Payer: 59

## 2016-01-04 ENCOUNTER — Inpatient Hospital Stay (HOSPITAL_COMMUNITY): Payer: 59 | Admitting: Occupational Therapy

## 2016-01-04 ENCOUNTER — Inpatient Hospital Stay (HOSPITAL_COMMUNITY): Payer: 59 | Admitting: Speech Pathology

## 2016-01-04 NOTE — Progress Notes (Signed)
Physical Therapy Session Note  Patient Details  Name: Justin Fox MRN: [id number] Date of Birth: [birth year]  Today's Date: 01/04/2016 PT Individual Time: [id number] PT Individual Time Calculation (min): 55 min    Short Term Goals: Week 2:  PT Short Term Goal 1 (Week 2): Pt will sustainn attention to funcitonal task x 30 seconds with supervision PT Short Term Goal 2 (Week 2): Pt will consistently perform gait up to 25' with mod A PT Short Term Goal 3 (Week 2): Pt will consistently perform functional transfers with min A  Skilled Therapeutic Interventions/Progress Updates:    Pt received in bed & agreeable to PT, noting "I hurt all over" with therapist providing distraction. Therapist donned pt's shoes total assist for time management. Pt ambulated bed>bathroom without AD & mod assist overall 2/2 inconsistent gait pattern and decreased balance. Pt completed toilet transfers from elevated commode with min/mod assist. Pt required cuing to remain sitting on toilet as he had reported need to use the bathroom. Pt able to manage brief & shorts with maximum cuing to attend to task. Throughout session pt propelled w/c with BLE and supervision. Pt able to sort colored coins with max cuing to attend to ask, and assist therapist in sorting deck of cards with sustained attention for a max of 1 minute. Pt repeatedly stated "I've got to go to the bathroom." "I need to sit." and "can I take these off?" referring to his brace/cast. Therapist provided max cuing & redirection but pt with no recall of information. Pt required breaks to propel his w/c throughout unit as he easily became frustrated with cognitive tasks. Pt able to correctly recall therapist's name 30% of the time with max cuing. At end of session pt returned to bed & was left with all needs within reach & bed alarm set.  Pt able to recall that he is in a hospital on this date.   Therapy Documentation Precautions:  Precautions Precautions:  Fall Restrictions Weight Bearing Restrictions: Yes RUE Weight Bearing: Non weight bearing LUE Weight Bearing: Non weight bearing Other Position/Activity Restrictions: pt. has cast on left forearm, splint on right forearm   See Function Navigator for Current Functional Status.   Therapy/Group: Kendall 01/04/2016, 5:22 PM

## 2016-01-04 NOTE — Progress Notes (Signed)
[facility] PHYSICAL MEDICINE & REHABILITATION     PROGRESS NOTE    Subjective/Complaints: Slept well. No issues overnight. Arouses easily  ROS very limited by mental status  Objective: Vital Signs: Blood pressure 128/67, pulse 68, temperature 97.9 F (36.6 C), temperature source Oral, resp. rate 16, weight 78.9 kg (173 lb 14.7 oz), SpO2 99 %. No results found. No results for input(s): WBC, HGB, HCT, PLT in the last 72 hours. No results for input(s): NA, K, CL, GLUCOSE, BUN, CREATININE, CALCIUM in the last 72 hours.  Invalid input(s): CO CBG (last 3)  No results for input(s): GLUCAP in the last 72 hours.  Wt Readings from Last 3 Encounters:  12/29/15 78.9 kg (173 lb 14.7 oz)  11/23/15 80 kg (176 lb 5.9 oz)  10/12/15 86.2 kg (190 lb)    Physical Exam:  Constitutional: He appears well-developed. NAD. Eyes: Right eye ptosis.  Conj are normal.  Neck: Normal range of motion. Neck supple.  Trach site healing, small scab still present. Cardiovascular: Normal rateand regular rhythm.  Respiratory: Effort normaland breath sounds normal. No respiratory distress.  GI: Soft. Bowel sounds are normal. He exhibits no distension.  PEG tube in place Neurological: He is awake and fairly alert   Oriented to self only, sometimes hospital. Perseverative at times. Language of confusion. Moves all 4's  Sensation appears to be intact to light touch throughout. DTRs 3+ LLE. Skin:  Left splint upper extremity and Hand splint RUE Psych: Unable to assess to due cognition  Assessment/Plan: 1. 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 Physiatrist is providing close team supervision and 24 hour management of active medical problems listed below. Physiatrist and rehab team continue to assess barriers to discharge/monitor patient progress toward functional  and medical goals.  Function:  Bathing Bathing position Bathing activity did not occur: Refused Position: Production manager parts bathed by patient: Right arm, Left arm, Chest Body parts bathed by helper: Back  Bathing assist Assist Level: Touching or steadying assistance(Pt > 75%)      Upper Body Dressing/Undressing Upper body dressing   What is the patient wearing?: Pull over shirt/dress     Pull over shirt/dress - Perfomed by patient: Thread/unthread right sleeve, Put head through opening, Thread/unthread left sleeve, Pull shirt over trunk Pull over shirt/dress - Perfomed by helper: Pull shirt over trunk        Upper body assist Assist Level: Set up   Set up : To obtain clothing/put away  Lower Body Dressing/Undressing Lower body dressing   What is the patient wearing?: Pants   Underwear - Performed by helper: Thread/unthread right underwear leg, Thread/unthread left underwear leg, Pull underwear up/down Pants- Performed by patient: Pull pants up/down Pants- Performed by helper: Thread/unthread right pants leg, Thread/unthread left pants leg   Non-skid slipper socks- Performed by helper: Don/doff right sock, Don/doff left sock                  Lower body assist Assist for lower body dressing:  (mod A)      Toileting Toileting Toileting activity did not occur: No continent bowel/bladder event Toileting steps completed by patient: Adjust clothing after toileting Toileting steps completed by helper: Adjust clothing prior to toileting, Performs perineal hygiene, Adjust clothing after toileting Toileting Assistive Devices: Grab bar or rail  Toileting assist Assist level: Two helpers (per [employer], NT)  Transfers Chair/bed transfer   Chair/bed transfer method: Stand pivot Chair/bed transfer assist level: Maximal assist (Pt 25 - 49%/lift and lower)       Locomotion Ambulation     Max distance: 15 ft Assist level: Moderate assist (Pt 50 - 74%)    Wheelchair   Type: Manual Max wheelchair distance: 150 ft (BLE only) Assist Level: Supervision or verbal cues  Cognition Comprehension Comprehension assist level: Understands basic less than 25% of the time/ requires cueing >75% of the time  Expression Expression assist level: Expresses basic 25 - 49% of the time/requires cueing 50 - 75% of the time. Uses single words/gestures.  Social Interaction Social Interaction assist level: Interacts appropriately 25 - 49% of time - Needs frequent redirection.  Problem Solving Problem solving assist level: Solves basic less than 25% of the time - needs direction nearly all the time or does not effectively solve problems and may need a restraint for safety  Memory Memory assist level: Recognizes or recalls less than 25% of the time/requires cueing greater than 75% of the time   Medical Problem List and Plan: 1. 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 Status post ORIF of multiple facial fractures 12/08/2015  -continue CIR therapies  -right SPICA splint to allow more use of RUE in therapy 2. DVT Prophylaxis/Anticoagulation: Subcutaneous Lovenox.   -dopplers negative 3. Pain Management: will use tramadol and tylenol for pain.  4. Mood: Seroquel 12.5 mg twice a day--titrate if needed  -ritalin-continue at 10mg  as long as not agitated by dose 5. Neuropsych: This patient is notcapable of making decisions on hisown behalf. 6. Skin/Wound Care: Routine skin checks 7. Fluids/Electrolytes/Nutrition: PO intake pretty good 90-100% 8.Tracheostomy. Decannulated. Monitor for healing. 9.Dysphagia. Gastrostomy tube 12/03/2015. Currently on a dysphagia #1 honey thick liquid diet.  -f/u MBS per SLP  -would stay with soft diet given facial fractures regardless of how much liquids are advanced 9.Hypertension. Norvasc 10 mg daily, Coreg 12.5 mg twice a day. Monitor with increased mobility 10.Nondisplaced  right radial styloid fracture. Nonweightbearing 11. Comminuted distal left radius fracture Status post closed reduction. Nonweightbearing--consulting ortho regarding fx's  -probably can advance wb/use soon  -order films  -will contact Dr. Osborn Coho 12..Urinary retention. Flomax 0.8 mg daily.   -requiring intermittent I/O caths  -keflex for E Coli UTI 13.Constipation. Laxative assistance   LOS (Days) 8 A FACE TO FACE EVALUATION WAS PERFORMED  Justin Fox T 01/04/2016 8:04 AM

## 2016-01-04 NOTE — Progress Notes (Addendum)
Physical Therapy Note  Patient Details  Name: Justin Fox MRN: [id number] Date of Birth: 30-Nov-1960 Today's Date: 01/04/2016    Time: [phone] 50 minutes  1:1 Pt with no c/o pain, just states "I don't feel good".  RN administered meds during session. Pt with some awareness of why he is in the hospital "because I fell, my head is banged up".  Pt able to propel w/c throughout unit with bilat LEs with supervision, cues for attention.  Pipe tree activity first attempt with supervision, able to solve in < 3 minutes.  Second attempt at pipe tree pt unable to attend >20 seconds, requires PT to offer choice of 2 pieces with pt able to choose correct piece 75% of trials. Pt requires max encouragement to build pipe tree on second attempt.  Pt continues to perseverate on using restroom. Pt gait to restroom with mod A 100' with ataxic gait. Pt requires max cuing to sit on toilet for >10 seconds, continuously asking to get up. Pt unable to be successful with bowel or bladder on toilet. Pt then gait to bed with min A for short distance.  Supervision with bed mobility, max cuing for safe positioning in bed.  Pt continues to be limited by awareness and attention limiting functional progress.  Time 2: 1400-1426 26 minutes  1:1 No c/o pain. Pt c/o fatigue but able to participate in treatment while lying on therapy mat.  Pt able to correctly identify higher or lower card to play a game of "war".  Also able to correctly add numbers 25% of time and able to decide whether to "hit" or not 75% of time to play a game of blackjack.  Pt continues to perseverate on fatigue and bathroom, but able to fully participate this session.   DONAWERTH,KAREN 01/04/2016, 10:21 AM

## 2016-01-04 NOTE — Progress Notes (Signed)
Occupational Therapy Session Note  Patient Details  Name: Justin Fox MRN: [id number] Date of Birth: 1961-02-21  Today's Date: 01/04/2016 OT Individual Time: 1300-1400 OT Individual Time Calculation (min): 60 min     Short Term Goals: Week 1:  OT Short Term Goal 1 (Week 1): Pt will perform toilet transfer with mod A in order to decrease level of functional transfers.  OT Short Term Goal 2 (Week 1): Pt will engage in 2 minutes of sustained attention during functional task.  OT Short Term Goal 3 (Week 1): Pt will perform grooming tasks with min A. OT Short Term Goal 4 (Week 1): Pt will perform UB dressing with min A.   Skilled Therapeutic Interventions/Progress Updates:    Pt with decreased sustained attention to task, only able to maintain attention to therapist questions or instructions for 10-20 secs max during session.  Mod assist for transfer from supine to sit EOB.  Mod assist for all mobility during session including 2 toilet transfers where pt was not successful with voiding or having BM on either one.  Pt not oriented to place, time, or situation without max instructional cueing.  Mod instructional cueing with all transfers to not use UEs for support.  He needed max cueing to determine today's date including month and day of the week.  Also could not determine correct answer if asked "What will tomorrows date be?"  Ambulated to the dayroom and around to the ADL apartment with multiple rest breaks and his brother [name] providing support as well.  Attempted discussions of favorite cars, and sports teams but pt with increased difficulty maintaining attention to questions.  He would perseverate on needing to go to the bathroom with word insertion of "bathroom" really meaning the bed.  Pt left in wheelchair with PT present at end of session.   Therapy Documentation Precautions:  Precautions Precautions: Fall Restrictions Weight Bearing Restrictions: Yes RUE Weight Bearing: Non weight  bearing LUE Weight Bearing: Non weight bearing Other Position/Activity Restrictions: pt. has cast on left forearm, splint on right forearm  Pain: Pain Assessment Pain Assessment: No/denies pain Faces Pain Scale: Hurts even more Pain Type: Acute pain Pain Location: Generalized Pain Intervention(s): Repositioned;Distraction ADL: See Function Navigator for Current Functional Status.   Therapy/Group: Individual Therapy  Vega Stare OTR/L 01/04/2016, 3:46 PM

## 2016-01-04 NOTE — Progress Notes (Signed)
Speech Language Pathology Daily Session Note  Patient Details  Name: Justin Fox MRN: [id number] Date of Birth: [birth year]  Today's Date: 01/04/2016 SLP Individual Time: 0830-0930 SLP Individual Time Calculation (min): 60 min   Short Term Goals: Week 1: SLP Short Term Goal 1 (Week 1): Patient will orient to place and situation with Max A question and visual cues.  SLP Short Term Goal 2 (Week 1): Patient will demonstrate sustained attention to functional tasks for ~1 minute with Max A verbal cues for redirection.  SLP Short Term Goal 3 (Week 1): Patient will follow 1 step commands in 75% of opportunities with Mod A verbal cues.  SLP Short Term Goal 4 (Week 1): Patient will identify 1 cognitive and 1 physical deficit with Max A multimodal cues. SLP Short Term Goal 5 (Week 1): Patient will consume trials of upgraded liquids without overt s/s of aspiration over 2 consecutive sessions prior to repeat MBS.  SLP Short Term Goal 6 (Week 1): Patient will consume current diet with minimal overt s/s of aspiration with Mod A verbal cues for use of swallowing compensatory strategies.   Skilled Therapeutic Interventions:Skilled therapy intervention focused on cognitive and dysphagia goals. Patient consumed breakfast tray of Dys. 1 textures and honey-thick liquids with no overt s/s of aspiration. Given Max A verbal, visual, and tactile cues for initiation and selective attention, patient engaged in self-feeding for approximately 2 bites. Patient was perseverative on using the bathroom, but did not void once taken to the toilet. Patient continues to demonstrate language of confusion. Patient engaged in a basic problem solving and visuospatial awareness task given Max A verbal and visual cues for selective attention and initiation. Patient left upright in wheelchair with RN and all needs within reach. Continue current plan of care.    Function:  Eating Eating   Modified Consistency Diet: Yes Eating Assist  Level: Supervision or verbal cues;Helper scoops food on utensil;Helper brings food to mouth   Eating Set Up Assist For: Opening containers Helper [place] on Utensil: Every scoop Helper [place] to Mouth: Occasionally   Cognition Comprehension Comprehension assist level: Understands basic less than 25% of the time/ requires cueing >75% of the time  Expression   Expression assist level: Expresses basic 25 - 49% of the time/requires cueing 50 - 75% of the time. Uses single words/gestures.  Social Interaction Social Interaction assist level: Interacts appropriately 25 - 49% of time - Needs frequent redirection.  Problem Solving Problem solving assist level: Solves basic less than 25% of the time - needs direction nearly all the time or does not effectively solve problems and may need a restraint for safety  Memory Memory assist level: Recognizes or recalls less than 25% of the time/requires cueing greater than 75% of the time    Pain Pain Assessment Pain Assessment: No/denies pain  Therapy/Group: Individual Therapy  [name] 01/04/2016, 3:13 PM

## 2016-01-04 NOTE — Progress Notes (Signed)
Physical Therapy Weekly Progress Note  Patient Details  Name: Justin Fox MRN: [id number] Date of Birth: 05/03/1960  Beginning of progress report period: December 28, 2015 End of progress report period: January 04, 2016  Patient has met 1 of 3 short term goals.  Pt still with significant deficits in attention and awareness which limit progress with functional ambulation and activity tolerance tasks. Pt requires assist to attend to activity > 20 seconds.  Patient therefore will continue to benefit from skilled PT intervention to enhance overall performance with activity tolerance, balance, postural control, ability to compensate for deficits, attention, awareness, coordination and knowledge of precautions.  Patient progressing toward long term goals..  Continue plan of care.  PT Short Term Goals Week 1:  PT Short Term Goal 1 (Week 1): pt will sustain attention to functional task x 30 seconds with supervision PT Short Term Goal 1 - Progress (Week 1): Progressing toward goal PT Short Term Goal 2 (Week 1): Pt will perform functional transfers consistently with mod A PT Short Term Goal 2 - Progress (Week 1): Met PT Short Term Goal 3 (Week 1): Pt will gait in controlled environment x 25' with mod A PT Short Term Goal 3 - Progress (Week 1): Progressing toward goal Week 2:  PT Short Term Goal 1 (Week 2): Pt will sustainn attention to funcitonal task x 30 seconds with supervision PT Short Term Goal 2 (Week 2): Pt will consistently perform gait up to 25' with mod A PT Short Term Goal 3 (Week 2): Pt will consistently perform functional transfers with min A   Skilled Therapeutic Interventions/Progress Updates: Ambulation/gait training;Community reintegration;Neuromuscular re-education;DME/adaptive equipment instruction;Stair training;UE/LE Strength taining/ROM;Wheelchair propulsion/positioning;UE/LE Coordination activities;Therapeutic Activities;Pain management;Discharge planning;Balance/vestibular  training;Functional electrical stimulation;Functional mobility training;Cognitive remediation/compensation;Patient/family education;Splinting/orthotics;Therapeutic Exercise     See Function Navigator for Current Functional Status.   Bryer Cozzolino 01/04/2016, 7:27 AM

## 2016-01-05 ENCOUNTER — Inpatient Hospital Stay (HOSPITAL_COMMUNITY): Payer: 59 | Admitting: Speech Pathology

## 2016-01-05 ENCOUNTER — Inpatient Hospital Stay (HOSPITAL_COMMUNITY): Payer: 59

## 2016-01-05 ENCOUNTER — Inpatient Hospital Stay (HOSPITAL_COMMUNITY): Payer: 59 | Admitting: Occupational Therapy

## 2016-01-05 ENCOUNTER — Inpatient Hospital Stay (HOSPITAL_COMMUNITY): Payer: 59 | Admitting: Physical Therapy

## 2016-01-05 DIAGNOSIS — S52516S Nondisplaced fracture of unspecified radial styloid process, sequela: Secondary | ICD-10-CM

## 2016-01-05 DIAGNOSIS — S069X3D Unspecified intracranial injury with loss of consciousness of 1 hour to 5 hours 59 minutes, subsequent encounter: Secondary | ICD-10-CM

## 2016-01-05 MED ORDER — DOCUSATE SODIUM 50 MG/5ML PO LIQD
100.0000 mg | Freq: Two times a day (BID) | ORAL | Status: DC
  Administered 2016-01-05 – 2016-01-17 (×23): 100 mg via ORAL
  Filled 2016-01-05 (×26): qty 10

## 2016-01-05 MED ORDER — LIDOCAINE HCL 2 % EX GEL
CUTANEOUS | Status: DC | PRN
  Filled 2016-01-05: qty 5

## 2016-01-05 NOTE — Progress Notes (Signed)
Occupational Therapy Session Note  Patient Details  Name: Justin Fox MRN: [id number] Date of Birth: [birth year]  Today's Date: 01/05/2016 OT Individual Time: 1000-1026 OT Individual Time Calculation (min): 26 min     Short Term Goals: Week 1:  OT Short Term Goal 1 (Week 1): Pt will perform toilet transfer with mod A in order to decrease level of functional transfers.  OT Short Term Goal 2 (Week 1): Pt will engage in 2 minutes of sustained attention during functional task.  OT Short Term Goal 3 (Week 1): Pt will perform grooming tasks with min A. OT Short Term Goal 4 (Week 1): Pt will perform UB dressing with min A.   Skilled Therapeutic Interventions/Progress Updates:    Treatment session with focus on orientation, attention to task, and functional mobility.  Pt received in bed pleasant and interacting with therapist.  Engaged in orientation questions with pt stating he was at [facility], reoriented to [facility] with focus on therapies.  Engaged in attention task with mod cues to redirect to task with copying pattern of colors in [employer].  Pt reports need to toilet, but once seated EOB pt reports pain and returned to supine in bed.  Did get up and ambulated to bathroom with mod assist for motor control due to ataxic gait.  Therapist provided cues to maintain seated on toilet while urinating as pt perseverating on "can I get up?".  Required mod cues and redirection to attempt to pull up pants prior to ambulating back to bed.  Therapy Documentation Precautions:  Precautions Precautions: Fall Restrictions Weight Bearing Restrictions: Yes RUE Weight Bearing: Weight bearing as tolerated LUE Weight Bearing: Non weight bearing Other Position/Activity Restrictions: pt. has cast on left forearm, splint on right forearm - can remove Right splint Pain:  Pt with c/o pain with mobility, unable to rate.  Notified RN  See Function Navigator for Current Functional  Status.   Therapy/Group: Individual Therapy  Simonne Come 01/05/2016, 10:39 AM
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
## 2016-01-05 NOTE — Progress Notes (Signed)
Speech Language Pathology Weekly Progress Note  Patient Details  Name: Justin Fox MRN: [id number] Date of Birth: [birth year]  Beginning of progress report period: December 27, 2015 End of progress report period: January 05, 2016    Short Term Goals: Week 1: SLP Short Term Goal 1 (Week 1): Patient will orient to place and situation with Max A question and visual cues.  SLP Short Term Goal 1 - Progress (Week 1): Not met SLP Short Term Goal 2 (Week 1): Patient will demonstrate sustained attention to functional tasks for ~1 minute with Max A verbal cues for redirection.  SLP Short Term Goal 2 - Progress (Week 1): Not met SLP Short Term Goal 3 (Week 1): Patient will follow 1 step commands in 75% of opportunities with Mod A verbal cues.  SLP Short Term Goal 3 - Progress (Week 1): Met SLP Short Term Goal 4 (Week 1): Patient will identify 1 cognitive and 1 physical deficit with Max A multimodal cues. SLP Short Term Goal 4 - Progress (Week 1): Not met SLP Short Term Goal 5 (Week 1): Patient will consume trials of upgraded liquids without overt s/s of aspiration over 2 consecutive sessions prior to repeat MBS.  SLP Short Term Goal 5 - Progress (Week 1): Met SLP Short Term Goal 6 (Week 1): Patient will consume current diet with minimal overt s/s of aspiration with Mod A verbal cues for use of swallowing compensatory strategies.  SLP Short Term Goal 6 - Progress (Week 1): Not met    New Short Term Goals: Week 2: SLP Short Term Goal 1 (Week 2): Patient will orient to place and situation with Max A question and visual cues.  SLP Short Term Goal 2 (Week 2): Patient will demonstrate sustained attention to functional tasks for ~1 minute with Max A verbal cues for redirection.  SLP Short Term Goal 3 (Week 2): Patient will identify 1 cognitive and 1 physical deficit with Max A multimodal cues. SLP Short Term Goal 4 (Week 2): Patient will follow 1 step commands in 75% of opportunities with Min A verbal  cues.  SLP Short Term Goal 5 (Week 2): Patient will utilize multiple swallows during trials of nectar-thick liquids with minimal overt s/s of aspiration and Mod A verbal cues.  SLP Short Term Goal 6 (Week 2): Patient will consume current diet with minimal overt s/s of aspiration with Mod A verbal cues for use of swallowing compensatory strategies.   Weekly Progress Updates: Patient has made functional gains and has met 1 of 6 STG's this reporting period. Currently, patient is consuming Dys. 1 textures with honey-thick liquids with minimal overt s/s of aspiration and requires overall Max-Total A multimodal cues for use of swallowing compensatory strategies.  Patient had repeat MBS today which showed silent penetration of nectar-thick liquids, therefore, recommend patient continue current diet with trials of nectar-thick liquids via tsp with SLP only. Patient continues to demonstrate behaviors consistent with a Rancho Level V-emerging VI and requires overall Max-Total to complete functional and familiar tasks safely in regards to sustained attention, orientation, intellectual awareness, problem solving and recall. Patient is also restless, perseverative and impulsive. Family education is ongoing. Patient would benefit from continued skilled SLP intervention to maximize his cognitive and swallowing function and overall functional independence prior to discharge.      Intensity: Minumum of 1-2 x/day, 30 to 90 minutes Frequency: 3 to 5 out of 7 days Duration/Length of Stay: 01/18/16 Treatment/Interventions: Cognitive remediation/compensation;Cueing hierarchy;Functional tasks;Patient/family education;Dysphagia/aspiration precaution training;Internal/external aids;Speech/Language  facilitation;Environmental controls;Therapeutic Activities      [place], [place] 01/05/2016, 4:02 PM
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
## 2016-01-05 NOTE — Progress Notes (Signed)
Recreational Therapy Session Note  Patient Details  Name: JAQUARIS MASCARENAS MRN: [id number] Date of Birth: 06-28-1960 Today's Date: 01/05/2016  Pain: no c/o  Pt participated in animal assisted activity/therapy seated in chair in family room with daughter present.  Pt asking appropriate sand engaging in conversation with animal assisted therapy team ~5 minutes with supervision.  Severa Jeremiah 01/05/2016, 3:25 PM

## 2016-01-05 NOTE — Progress Notes (Signed)
Physical Therapy Session Note  Patient Details  Name: Justin Fox MRN: [id number] Date of Birth: 1960/06/15  Today's Date: 01/05/2016 PT Individual Time: [id number] PT Individual Time Calculation (min): 44 min    Short Term Goals: Week 2:  PT Short Term Goal 1 (Week 2): Pt will sustainn attention to funcitonal task x 30 seconds with supervision PT Short Term Goal 2 (Week 2): Pt will consistently perform gait up to 25' with mod A PT Short Term Goal 3 (Week 2): Pt will consistently perform functional transfers with min A  Skilled Therapeutic Interventions/Progress Updates:    Pt received in dayroom in care of daughter & agreeable to PT. Throughout session pt noted "I feel like crap", "my head hurts", and "my stomach hurts"; RN made aware. Session focused on sustained attention to task involving selecting appropriate bean bags out of pile and assembling pipe tree. Pt required max cuing to attend to task with max time 10-20 seconds. Pt less perseverative on need to use restroom this session. Pt ambulated room<>day room x 3 with min assist overall 2/2 ataxic gait. Pt completed toilet transfer with min/mod assist, (+) void, and successfully managed clothing with cuing. Therapist passively oriented pt to room number, month, and day of week throughout session. Pt able to read time on analog clock correctly 50% of the time and recall therapist's name with cuing of first letter of name. At end of session pt left in bed with all needs within reach & daughter present.  Pt with poor safety awareness as he continues to push with BUE to transfer sit>stand.   Therapy Documentation Precautions:  Precautions Precautions: Fall Restrictions Weight Bearing Restrictions: Yes RUE Weight Bearing: Non weight bearing LUE Weight Bearing: Non weight bearing Other Position/Activity Restrictions: pt. has cast on left forearm, splint on right forearm - can remove Right splint   See Function Navigator for Current  Functional Status.   Therapy/Group: Individual Therapy  [name] 01/05/2016, 5:09 PM
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
## 2016-01-05 NOTE — Progress Notes (Signed)
Occupational Therapy Session Note  Patient Details  Name: Justin Fox MRN: [id number] Date of Birth: [birth year]  Today's Date: 01/05/2016 OT Individual Time: [id number] OT Individual Time Calculation (min): 60 min     Skilled Therapeutic Interventions/Progress Updates:   Offered bathing and dressing.  patient able to attend for washing periarea & face with max cueing for focus and orientation to situation.   Patient very impulsive during session and walked away from therapist while standing at sink - even though his brief and pants were down at his knees.   He walked away to get back into bed.  This clinician was able to continue lower body dressing of brief, pants, socks and shoes with extra time to process and focus.    Patient appeared restless as evidenced by whatever surface he was on (bed or w/c), the whole session, he stated he wanted to get up into chair and ly down into bed.   Patient was polite the whole session.  Patient left sitting in w/c at bedeside with safety belt and call bell in place.   He was able to demonstrate utilizing the call bell for help. Nurse tech was alerted when session ended and informed patient was sitting in w/c but requesting to ly back into bed.  Therapy Documentation Precautions:  Precautions Precautions: Fall Restrictions Weight Bearing Restrictions: Yes RUE Weight Bearing: Weight bearing as tolerated LUE Weight Bearing: Non weight bearing Other Position/Activity Restrictions: pt. has cast on left forearm, splint on right forearm - can remove Right splint    Pain:occassionally called out, "ouch, ouch" during many movements that he initiated but denied need for pain meds or assessment    See Function Navigator for Current Functional Status.   Therapy/Group: Individual Therapy  Alfredia Ferguson [facility] 01/05/2016, 1:44 PM

## 2016-01-05 NOTE — Progress Notes (Signed)
Physical Therapy Session Note  Patient Details  Name: Justin Fox MRN: [id number] Date of Birth: Jan 09, 1961  Today's Date: 01/05/2016 PT Individual Time: 1415-1500 PT Individual Time Calculation (min): 45 min    Short Term Goals: Week 1:  PT Short Term Goal 1 (Week 1): pt will sustain attention to functional task x 30 seconds with supervision PT Short Term Goal 1 - Progress (Week 1): Progressing toward goal PT Short Term Goal 2 (Week 1): Pt will perform functional transfers consistently with mod A PT Short Term Goal 2 - Progress (Week 1): Met PT Short Term Goal 3 (Week 1): Pt will gait in controlled environment x 25' with mod A PT Short Term Goal 3 - Progress (Week 1): Progressing toward goal Week 2:  PT Short Term Goal 1 (Week 2): Pt will sustainn attention to funcitonal task x 30 seconds with supervision PT Short Term Goal 2 (Week 2): Pt will consistently perform gait up to 25' with mod A PT Short Term Goal 3 (Week 2): Pt will consistently perform functional transfers with min A  Skilled Therapeutic Interventions/Progress Updates:     Patient received sitting in family with daughter present. Patient performed stand pivot transfer to [facility] with min A following mod encouragement to participate in rehab. Patient performed WC mobility for 117f with BLE technique and supervision Assist from PT. Gait training for 1234fx 2 and 20026fith moderate assist and moderate cues for continued participation and increased attention to task. Sit<>stand Transfer training from sofa height x 2 with min A from PT.   Standing balance to fold towels. With 2 bouts of 1 minute. PT provided min A to maintain standing balance and prevent anterior/posterior LOB. Patient unable to sustain focus on task >1 minute while standing; reporting need to sit.   Patient left with daughter at end of PT treatment session, sitting in WC.       Therapy Documentation Precautions:  Precautions Precautions:  Fall Restrictions Weight Bearing Restrictions: Yes RUE Weight Bearing: Non weight bearing LUE Weight Bearing: Non weight bearing Other Position/Activity Restrictions: pt. has cast on left forearm, splint on right forearm - can remove Right splint Pain: Pain Assessment Pain Assessment: No/denies pain Pain Score: 0-No pain   See Function Navigator for Current Functional Status.   Therapy/Group: Individual Therapy  AusLorie Phenix/07/2015, 3:02 PM

## 2016-01-05 NOTE — Progress Notes (Signed)
Speech Language Pathology Daily Session Note  Patient Details  Name: Justin Fox MRN: [id number] Date of Birth: 10/30/1960  Today's Date: 01/05/2016 SLP Individual Time: 1300-1400 SLP Individual Time Calculation (min): 60 min   Short Term Goals: Week 1: SLP Short Term Goal 1 (Week 1): Patient will orient to place and situation with Max A question and visual cues.  SLP Short Term Goal 2 (Week 1): Patient will demonstrate sustained attention to functional tasks for ~1 minute with Max A verbal cues for redirection.  SLP Short Term Goal 3 (Week 1): Patient will follow 1 step commands in 75% of opportunities with Mod A verbal cues.  SLP Short Term Goal 4 (Week 1): Patient will identify 1 cognitive and 1 physical deficit with Max A multimodal cues. SLP Short Term Goal 5 (Week 1): Patient will consume trials of upgraded liquids without overt s/s of aspiration over 2 consecutive sessions prior to repeat MBS.  SLP Short Term Goal 6 (Week 1): Patient will consume current diet with minimal overt s/s of aspiration with Mod A verbal cues for use of swallowing compensatory strategies.   Skilled Therapeutic Interventions: Skilled therapy intervention focused on cognitive and dysphagia goals. Patient given trials of nectar-thick liquid via teaspoon with intermittant overt s/s of aspiration and required Max A verbal and visual cues for use of multiple swallows to clear suspected pharyngeal residue. Patient required Mod A verbal and visual cues for problem solving during a structured matching task from a field of 3 to 4 to 9, fading to supervision level cues. Patient required Max A verbal cues for sustained attention to matching task for up to 20 seconds. Given a money management activity, patient required Max A verbal and visual cues for sequencing, problem solving, and selective attention with coins. Patient perseverative on using the bathroom. Family education given to daughter who was present and engaged in  therapeutic tasks throughout the session. Patient left seated in family room with daughter. Continue current plan of care.   Function:  Eating Eating     Eating Assist Level: Set up assist for;Helper feeds patient   Eating Set Up Assist For: Opening containers Helper Scoops Food on Utensil: Every scoop Helper Brings Food to Mouth: Every scoop   Cognition Comprehension Comprehension assist level: Understands basic 25 - 49% of the time/ requires cueing 50 - 75% of the time  Expression   Expression assist level: Expresses basic 25 - 49% of the time/requires cueing 50 - 75% of the time. Uses single words/gestures.  Social Interaction Social Interaction assist level: Interacts appropriately 25 - 49% of time - Needs frequent redirection.  Problem Solving Problem solving assist level: Solves basic 25 - 49% of the time - needs direction more than half the time to initiate, plan or complete simple activities  Memory Memory assist level: Recognizes or recalls less than 25% of the time/requires cueing greater than 75% of the time    Pain Pain Assessment Pain Assessment: No/denies pain Pain Score: 0-No pain  Therapy/Group: Individual Therapy  [name] 01/05/2016, 2:58 PM

## 2016-01-05 NOTE — Progress Notes (Signed)
Speech Language Pathology Note  Patient Details  Name: Justin Fox MRN: [id number] Date of Birth: 11/12/1960 Today's Date: 01/05/2016  MBSS complete. Full report located under chart review in imaging section.    Justin Fox 01/05/2016, 12:59 PM

## 2016-01-06 ENCOUNTER — Inpatient Hospital Stay (HOSPITAL_COMMUNITY): Payer: 59 | Admitting: Speech Pathology

## 2016-01-06 ENCOUNTER — Inpatient Hospital Stay (HOSPITAL_COMMUNITY): Payer: 59

## 2016-01-06 ENCOUNTER — Inpatient Hospital Stay (HOSPITAL_COMMUNITY): Payer: 59 | Admitting: Physical Therapy

## 2016-01-06 MED ORDER — ENSURE ENLIVE PO LIQD
237.0000 mL | Freq: Two times a day (BID) | ORAL | Status: DC
  Administered 2016-01-06 – 2016-01-24 (×32): 237 mL via ORAL

## 2016-01-06 NOTE — Progress Notes (Signed)
Occupational Therapy Weekly Progress Note  Patient Details  Name: Justin Fox MRN: [id number] Date of Birth: 02-17-1961  Beginning of progress report period: December 28, 2015 End of progress report period: October5, 2017  Patient has met 1 of 4 short term goals. Pt's progress has been inconsistent during the past week.  Pt continues to exhibit limited attention and requires max verbal cues to initiate and attend to all BADLs.  Pt requies mod/max A for bathing tasks and consistently states "I can't do it." Pt perseverates on going to bathroom but is successful with voiding or having a bowel movement approx 15% of tinme.  Pt requires max multimodal cues to actively engage/participate in functional tasks.  Pt requires max A for functional amb and functional transfers.   Patient continues to demonstrate the following deficits: decreased activity tolerance, decreased initiation, decreased attention, decreased awareness, decreased problem solving, decreased safety awareness, decreased memory and delayed processing and decreased sitting balance, decreased standing balance, decreased postural control and decreased balance strategies and therefore will continue to benefit from skilled OT intervention to enhance overall performance with BADL and Reduce care partner burden.  Patient progressing toward long term goals..  Continue plan of care.  OT Short Term Goals Week 1:  OT Short Term Goal 1 (Week 1): Pt will perform toilet transfer with mod A in order to decrease level of functional transfers.  OT Short Term Goal 1 - Progress (Week 1): Progressing toward goal OT Short Term Goal 2 (Week 1): Pt will engage in 2 minutes of sustained attention during functional task.  OT Short Term Goal 2 - Progress (Week 1): Progressing toward goal OT Short Term Goal 3 (Week 1): Pt will perform grooming tasks with min A. OT Short Term Goal 3 - Progress (Week 1): Progressing toward goal OT Short Term Goal 4 (Week 1):  Pt will perform UB dressing with min A.  OT Short Term Goal 4 - Progress (Week 1): Met Week 2:  OT Short Term Goal 1 (Week 2): Pt will perform toilet transfers with mod A OT Short Term Goal 2 (Week 2): Pt will maintain sustained attention for 2 minutes during functional task OT Short Term Goal 3 (Week 2): Pt will perform grooming tasks (wash face, brush teetn, and shave) with min A OT Short Term Goal 4 (Week 2): Pt will perform bathing tasks with min A OT Short Term Goal 5 (Week 2): Pt will perform LB dressing tasks with min A     Therapy Documentation Precautions:  Precautions Precautions: Fall Restrictions Weight Bearing Restrictions: Yes RUE Weight Bearing: Weight bearing as tolerated LUE Weight Bearing: Non weight bearing Other Position/Activity Restrictions: pt. has cast on left forearm, splint on right forearm - can remove Right splint  See Function Navigator for Current Functional Status.    [name], [name] [name] 01/06/2016, 1:30 PM

## 2016-01-06 NOTE — Consult Note (Signed)
Reason for Consult:bilateral distal radius fractures Referring Physician: Wiiliam, Justin Fox is an 55 y.o. male.  HPI: 55 yo male transferred to inpatient rehab from [facility] following a fall 30 ft from scaffolding with LOC.Treated for TBI, skull/sinus/orbital fractures, nondisplaced right radial styloid fracture as well as comminuted distal left radius fracturestatus post closed reduction. Left wrist immbolized in a cast. Currently NWB bilateral UE. Consulted to rec weightbearing status and to assume care outpatiently. Patient denies pain bilateral wrists.  Past Medical History:  Diagnosis Date  . Hypertension     Past Surgical History:  Procedure Laterality Date  . COLONOSCOPY WITH PROPOFOL N/A 10/12/2015   Procedure: COLONOSCOPY WITH PROPOFOL;  Surgeon: Lucilla Lame, MD;  Location: [facility] ENDOSCOPY;  Service: Endoscopy;  Laterality: N/A;  . NO PAST SURGERIES      Family History  Problem Relation Age of Onset  . Anxiety disorder Mother   . Thyroid disease Mother     Social History:  reports that he quit smoking about 7 years ago. His smoking use included Cigarettes. He has a 52.50 pack-year smoking history. He has never used smokeless tobacco. He reports that he does not drink alcohol or use drugs.  Allergies: No Known Allergies  Medications: I have reviewed the patient's current medications.  No results found for this or any previous visit (from the past 48 hour(s)).  Dg Swallowing Func-speech Pathology  Result Date: 01/05/2016 Objective Swallowing Evaluation: Type of Study: MBS-Modified Barium Swallow Study Patient Details Name: Justin Fox MRN: [id number] Date of Birth: 1960/12/14 Today's Date: 01/05/2016 Time: [id number] Time Calculation: 30 minutes No Data Recorded Past Medical History: Past Medical History: Diagnosis Date . Hypertension  Past Surgical History: Past Surgical History: Procedure Laterality Date . COLONOSCOPY WITH PROPOFOL N/A 10/12/2015  Procedure:  COLONOSCOPY WITH PROPOFOL;  Surgeon: Lucilla Lame, MD;  Location: [facility] ENDOSCOPY;  Service: Endoscopy;  Laterality: N/A; . NO PAST SURGERIES   HPI: See H&P No Data Recorded Assessment / Plan / Recommendation CHL IP CLINICAL IMPRESSIONS 01/05/2016 Therapy Diagnosis Moderate oral phase dysphagia;Moderate pharyngeal phase dysphagia Clinical Impression Patient demonstrates a moderate oral and pharyngeal dysphagia which is further impacted by cognitive impairments.  Patient's oral phase is characterized by decreased attention to bolus with decreased coordination and increased AP transit time.  Pharyngeal phase is characterized by reduced tongue based retraction and pharyngeal constriction with a delayed swallow initiation resulting in moderate vallecular residue that is reduced with multiple swallows, however, patient unable to utilize multiple swallows consistently due to cognitive impairments.  Patient also demonstrates silent penetration of nectar-thick liquids via cup which is further exacerbated by impulsivity. Limited trials were administered due to patient's ability to participate effectively. Recommend patient continue current diet of Dys. 1 textures with honey-thick liquids and consume trials of nectar-thick liquids via tsp with SLP only.    Impact on safety and function Moderate aspiration risk   CHL IP TREATMENT RECOMMENDATION 01/05/2016 Treatment Recommendations Therapy as outlined in treatment plan below   Prognosis 01/05/2016 Prognosis for Safe Diet Advancement Good Barriers to Reach Goals Cognitive deficits Barriers/Prognosis Comment -- CHL IP DIET RECOMMENDATION 01/05/2016 SLP Diet Recommendations Dysphagia 1 (Puree) solids;Honey thick liquids Liquid Administration via Spoon;Cup Medication Administration Crushed with puree Compensations Minimize environmental distractions;Slow rate;Small sips/bites;Multiple dry swallows after each bite/sip;Clear throat intermittently;Follow solids with liquid Postural Changes  Remain semi-upright after after feeds/meals (Comment);Seated upright at 90 degrees   CHL IP OTHER RECOMMENDATIONS 01/05/2016 Recommended Consults -- Oral Care Recommendations Oral care BID Other  Recommendations Order thickener from pharmacy;Prohibited food (jello, ice cream, thin soups);Remove water pitcher;Place PMSV during PO intake;Have oral suction available   CHL IP FOLLOW UP RECOMMENDATIONS 01/05/2016 Follow up Recommendations Home health SLP;24 hour supervision/assistance   CHL IP FREQUENCY AND DURATION 01/05/2016 Speech Therapy Frequency (ACUTE ONLY) min 5x/week Treatment Duration 2 weeks      CHL IP ORAL PHASE 01/05/2016 Oral Phase Impaired Oral - Pudding Teaspoon -- Oral - Pudding Cup -- Oral - Honey Teaspoon Decreased bolus cohesion;Lingual/palatal residue;Delayed oral transit;Premature spillage;Reduced posterior propulsion Oral - Honey Cup -- Oral - Nectar Teaspoon Delayed oral transit;Decreased bolus cohesion;Reduced posterior propulsion;Lingual/palatal residue Oral - Nectar Cup -- Oral - Nectar Straw -- Oral - Thin Teaspoon -- Oral - Thin Cup -- Oral - Thin Straw -- Oral - Puree -- Oral - Mech Soft -- Oral - Regular -- Oral - Multi-Consistency -- Oral - Pill -- Oral Phase - Comment --  CHL IP PHARYNGEAL PHASE 01/05/2016 Pharyngeal Phase Impaired Pharyngeal- Pudding Teaspoon -- Pharyngeal -- Pharyngeal- Pudding Cup -- Pharyngeal -- Pharyngeal- Honey Teaspoon Pharyngeal residue - valleculae;Reduced tongue base retraction;Compensatory strategies attempted (with notebox);Delayed swallow initiation-vallecula Pharyngeal -- Pharyngeal- Honey Cup -- Pharyngeal -- Pharyngeal- Nectar Teaspoon Delayed swallow initiation-vallecula;Reduced tongue base retraction;Pharyngeal residue - valleculae Pharyngeal -- Pharyngeal- Nectar Cup Penetration/Aspiration during swallow;Reduced tongue base retraction;Compensatory strategies attempted (with notebox);Delayed swallow initiation-vallecula;Pharyngeal residue - valleculae  Pharyngeal Material enters airway, CONTACTS cords and not ejected out Pharyngeal- Nectar Straw -- Pharyngeal -- Pharyngeal- Thin Teaspoon -- Pharyngeal -- Pharyngeal- Thin Cup -- Pharyngeal -- Pharyngeal- Thin Straw -- Pharyngeal -- Pharyngeal- Puree -- Pharyngeal -- Pharyngeal- Mechanical Soft -- Pharyngeal -- Pharyngeal- Regular -- Pharyngeal -- Pharyngeal- Multi-consistency -- Pharyngeal -- Pharyngeal- Pill -- Pharyngeal -- Pharyngeal Comment --  CHL IP CERVICAL ESOPHAGEAL PHASE 01/05/2016 Cervical Esophageal Phase WFL Pudding Teaspoon -- Pudding Cup -- Honey Teaspoon -- Honey Cup -- Nectar Teaspoon -- Nectar Cup -- Nectar Straw -- Thin Teaspoon -- Thin Cup -- Thin Straw -- Puree -- Mechanical Soft -- Regular -- Multi-consistency -- Pill -- Cervical Esophageal Comment -- No flowsheet data found. Fox, Justin 01/05/2016, 12:58 PM               Review of Systems  Musculoskeletal:       Denies bilateral arm/wrist pain  All other systems reviewed and are negative.  Blood pressure 114/76, pulse 89, temperature 98.3 F (36.8 C), temperature source Oral, resp. rate 18, weight 78.4 kg (172 lb 12.8 oz), SpO2 100 %. Physical Exam  Constitutional: He appears well-developed and well-nourished.  HENT:  Head: Normocephalic.  Eyes: EOM are normal.  Neck: Normal range of motion.  Cardiovascular: Intact distal pulses.   Respiratory: Effort normal.  Musculoskeletal:  R UE with no swelling, ecchymosis, abrasions, deformity No TTP R hand and wrist, no distal radius/ulna TTP at fracture site Full ROM wrist with no pain Distally NVI L UE in well fitted cast Wiggles fingers, distally NVI No distal hand swelling  Neurological: He is alert.  Skin: Skin is warm and dry.  Psychiatric: He has a normal mood and affect.    Assessment/Plan: nondisplaced right radial styloid fracture as well as comminuted distal left radius fracturestatus post closed reduction-- clinically healed on exam and xrays look  good Okay to d/c cast today- order to remove cast put in Okay for WBAT bilateral UE Fu with Dr. Tamera Punt at [place] in 4 weeks    Justin Fox 01/06/2016, 2:40 PM

## 2016-01-06 NOTE — Progress Notes (Addendum)
Physical Therapy Note  Patient Details  Name: DONNAVON OLNEY MRN: [id number] Date of Birth: 1960-04-25 Today's Date: 01/06/2016    Time: 1030-1120 50 minutes  1:1 Pt with no c/o pain during session. Pt requires max encouragement but willing to get out of bed. Performed gait throughout unit multiple attempts up to 200' with mod A due to ataxia.  Attempt to have pt attend to screw/nut/bolt task with max cuing. Pt continues to lay supine but able to get nuts off of bolts with max encouragment. Pt then participates in bean bag sorting task into lunch or breakfast foods, pt performed with 90% accuracy, max cuing for attention as pt perseverates on "laying down" despite already laying on mat.  Pt requiring increased cuing for attention to task >10 seconds today. Pt with improving recall of family members names and roles today.   Time: 1300-1400 60 minutes  1:1 No c/o pain. Pt performed gait 2 x 150' with mod A due to ataxia.  Pt performed bean bag sorting game with 75% accuracy to find 6 of each color and sort into piles.  Card game of "war" with 50% accuracy, perseverating on using restroom. Pt went to restroom with min A for balance and pt able to perform hygiene and clothing management with cuing. Pt + for BM in restroom.  Pt propelled w/c >500' in controlled environment with cues for attention to task throughout. Pt continues to perseverate on laying down and using restroom which limits functional abilities.  Shayne Deerman 01/06/2016, 12:26 PM

## 2016-01-06 NOTE — Progress Notes (Signed)
Occupational Therapy Note  Patient Details  Name: Justin Fox MRN: [id number] Date of Birth: 21-Jul-1960  Today's Date: 01/06/2016 OT Individual Time: 1400-1430 OT Individual Time Calculation (min): 30 min    Pt denied pain Individual Therapy  Pt laying on mat with PT upon arrival. Focus on task initiation and attention to task with functional activities.  Pt requires max verbal cues to initiate tasks and max multimodal cues to attend to tasks.  Pt continues to perseverate on laying down after being upright for approx 30 seconds.  Pt requires max A for functional ambulation and transfers.  Pt returned to bed at end of session with bed alarm activated, 3 rails up and all needs within reach.   Larod, Bargman [facility] 01/06/2016, 3:03 PM

## 2016-01-06 NOTE — Progress Notes (Signed)
Orthopedic Tech Progress Note Patient Details:  Justin Fox [birth year] [id number]  Casting Type of Cast: Short arm cast Cast Location: LUE Cast Material: Fiberglass Cast Intervention: Removal     Braulio Bosch 01/06/2016, 3:21 PM

## 2016-01-06 NOTE — Progress Notes (Signed)
Social Work Patient ID: Justin Fox, male   DOB: 1961-02-28, 55 y.o.   MRN: [id number]   CSW met with pt and then later spoke with pt's dtr and wife to update them on team conference discussion.  Pt is on target for d/c on 01-18-16 and family is pleased about that.  They are preparing for his arrival home and have been here a lot for therapies and are receiving education.  CSW will continue to follow and assist as needed.

## 2016-01-06 NOTE — Progress Notes (Signed)
[place] PHYSICAL MEDICINE & REHABILITATION     PROGRESS NOTE    Subjective/Complaints: Lying in bed. "cold". Remembered that I was his doctor this morning. Asked when he's going home. "i'm ready".   ROS remains  limited by mental status  Objective: Vital Signs: Blood pressure 119/78, pulse 78, temperature 98.5 F (36.9 C), temperature source Oral, resp. rate 18, weight 78.4 kg (172 lb 12.8 oz), SpO2 98 %. Dg Swallowing Func-speech Pathology  Result Date: 01/05/2016 Objective Swallowing Evaluation: Type of Study: MBS-Modified Barium Swallow Study Patient Details Name: Justin Fox MRN: [id number] Date of Birth: [birth year] Today's Date: 01/05/2016 Time: [id number] Time Calculation: 30 minutes No Data Recorded Past Medical History: Past Medical History: Diagnosis Date . Hypertension  Past Surgical History: Past Surgical History: Procedure Laterality Date . COLONOSCOPY WITH PROPOFOL N/A 10/12/2015  Procedure: COLONOSCOPY WITH PROPOFOL;  Surgeon: Lucilla Lame, MD;  Location: [facility] ENDOSCOPY;  Service: Endoscopy;  Laterality: N/A; . NO PAST SURGERIES   HPI: See H&P No Data Recorded Assessment / Plan / Recommendation CHL IP CLINICAL IMPRESSIONS 01/05/2016 Therapy Diagnosis Moderate oral phase dysphagia;Moderate pharyngeal phase dysphagia Clinical Impression Patient demonstrates a moderate oral and pharyngeal dysphagia which is further impacted by cognitive impairments.  Patient's oral phase is characterized by decreased attention to bolus with decreased coordination and increased AP transit time.  Pharyngeal phase is characterized by reduced tongue based retraction and pharyngeal constriction with a delayed swallow initiation resulting in moderate vallecular residue that is reduced with multiple swallows, however, patient unable to utilize multiple swallows consistently due to cognitive impairments.  Patient also demonstrates silent penetration of nectar-thick liquids via cup which is further exacerbated by  impulsivity. Limited trials were administered due to patient's ability to participate effectively. Recommend patient continue current diet of Dys. 1 textures with honey-thick liquids and consume trials of nectar-thick liquids via tsp with SLP only.    Impact on safety and function Moderate aspiration risk   CHL IP TREATMENT RECOMMENDATION 01/05/2016 Treatment Recommendations Therapy as outlined in treatment plan below   Prognosis 01/05/2016 Prognosis for Safe Diet Advancement Good Barriers to Reach Goals Cognitive deficits Barriers/Prognosis Comment -- CHL IP DIET RECOMMENDATION 01/05/2016 SLP Diet Recommendations Dysphagia 1 (Puree) solids;Honey thick liquids Liquid Administration via Spoon;Cup Medication Administration Crushed with puree Compensations Minimize environmental distractions;Slow rate;Small sips/bites;Multiple dry swallows after each bite/sip;Clear throat intermittently;Follow solids with liquid Postural Changes Remain semi-upright after after feeds/meals (Comment);Seated upright at 90 degrees   CHL IP OTHER RECOMMENDATIONS 01/05/2016 Recommended Consults -- Oral Care Recommendations Oral care BID Other Recommendations Order thickener from pharmacy;Prohibited food (jello, ice cream, thin soups);Remove water pitcher;Place PMSV during PO intake;Have oral suction available   CHL IP FOLLOW UP RECOMMENDATIONS 01/05/2016 Follow up Recommendations Home health SLP;24 hour supervision/assistance   CHL IP FREQUENCY AND DURATION 01/05/2016 Speech Therapy Frequency (ACUTE ONLY) min 5x/week Treatment Duration 2 weeks      CHL IP ORAL PHASE 01/05/2016 Oral Phase Impaired Oral - Pudding Teaspoon -- Oral - Pudding Cup -- Oral - Honey Teaspoon Decreased bolus cohesion;Lingual/palatal residue;Delayed oral transit;Premature spillage;Reduced posterior propulsion Oral - Honey Cup -- Oral - Nectar Teaspoon Delayed oral transit;Decreased bolus cohesion;Reduced posterior propulsion;Lingual/palatal residue Oral - Nectar Cup -- Oral -  Nectar Straw -- Oral - Thin Teaspoon -- Oral - Thin Cup -- Oral - Thin Straw -- Oral - Puree -- Oral - Mech Soft -- Oral - Regular -- Oral - Multi-Consistency -- Oral - Pill -- Oral Phase - Comment --  CHL IP PHARYNGEAL  PHASE 01/05/2016 Pharyngeal Phase Impaired Pharyngeal- Pudding Teaspoon -- Pharyngeal -- Pharyngeal- Pudding Cup -- Pharyngeal -- Pharyngeal- Honey Teaspoon Pharyngeal residue - valleculae;Reduced tongue base retraction;Compensatory strategies attempted (with notebox);Delayed swallow initiation-vallecula Pharyngeal -- Pharyngeal- Honey Cup -- Pharyngeal -- Pharyngeal- Nectar Teaspoon Delayed swallow initiation-vallecula;Reduced tongue base retraction;Pharyngeal residue - valleculae Pharyngeal -- Pharyngeal- Nectar Cup Penetration/Aspiration during swallow;Reduced tongue base retraction;Compensatory strategies attempted (with notebox);Delayed swallow initiation-vallecula;Pharyngeal residue - valleculae Pharyngeal Material enters airway, CONTACTS cords and not ejected out Pharyngeal- Nectar Straw -- Pharyngeal -- Pharyngeal- Thin Teaspoon -- Pharyngeal -- Pharyngeal- Thin Cup -- Pharyngeal -- Pharyngeal- Thin Straw -- Pharyngeal -- Pharyngeal- Puree -- Pharyngeal -- Pharyngeal- Mechanical Soft -- Pharyngeal -- Pharyngeal- Regular -- Pharyngeal -- Pharyngeal- Multi-consistency -- Pharyngeal -- Pharyngeal- Pill -- Pharyngeal -- Pharyngeal Comment --  CHL IP CERVICAL ESOPHAGEAL PHASE 01/05/2016 Cervical Esophageal Phase WFL Pudding Teaspoon -- Pudding Cup -- Honey Teaspoon -- Honey Cup -- Nectar Teaspoon -- Nectar Cup -- Nectar Straw -- Thin Teaspoon -- Thin Cup -- Thin Straw -- Puree -- Mechanical Soft -- Regular -- Multi-consistency -- Pill -- Cervical Esophageal Comment -- No flowsheet data found. PAYNE, [place] 01/05/2016, 12:58 PM              No results for input(s): WBC, HGB, HCT, PLT in the last 72 hours. No results for input(s): NA, K, CL, GLUCOSE, BUN, CREATININE, CALCIUM in the last 72  hours.  Invalid input(s): CO CBG (last 3)  No results for input(s): GLUCAP in the last 72 hours.  Wt Readings from Last 3 Encounters:  01/05/16 78.4 kg (172 lb 12.8 oz)  11/23/15 80 kg (176 lb 5.9 oz)  10/12/15 86.2 kg (190 lb)    Physical Exam:  Constitutional: He appears well-developed. NAD. Eyes: Right eye ptosis.  Conj are normal.  Neck: Normal range of motion. Neck supple.  Trach site healing, small scab still present. Cardiovascular: Normal rateand regular rhythm.  Respiratory: Effort normaland breath sounds normal. No respiratory distress.  GI: Soft. Bowel sounds are normal. He exhibits no distension.  PEG tube in place Neurological: He is awake and fairly alert   Oriented to self only, sometimes hospital. Perseverative at times. Language of confusion. Moves all 4's  Sensation appears to be intact to light touch throughout. DTRs 3+ LLE. Skin:  Left splint upper extremity and Hand splint RUE Psych: Unable to assess to due cognition  Assessment/Plan: 1. 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 Physiatrist is providing close team supervision and 24 hour management of active medical problems listed below. Physiatrist and rehab team continue to assess barriers to discharge/monitor patient progress toward functional and medical goals.  Function:  Bathing Bathing position Bathing activity did not occur: Refused Position: Production manager parts bathed by patient: Right arm, Left arm, Chest Body parts bathed by helper: Back  Bathing assist Assist Level: Touching or steadying assistance(Pt > 75%)      Upper Body Dressing/Undressing Upper body dressing   What is the patient wearing?: Pull over shirt/dress     Pull over shirt/dress - Perfomed by patient: Thread/unthread right sleeve, Put head through opening,  Thread/unthread left sleeve, Pull shirt over trunk Pull over shirt/dress - Perfomed by helper: Pull shirt over trunk        Upper body assist Assist Level: Set up   Set up : To obtain clothing/put away  Lower Body  Dressing/Undressing Lower body dressing   What is the patient wearing?: Shoes   Underwear - Performed by helper: Thread/unthread right underwear leg, Thread/unthread left underwear leg, Pull underwear up/down Pants- Performed by patient: Pull pants up/down Pants- Performed by helper: Thread/unthread right pants leg, Thread/unthread left pants leg   Non-skid slipper socks- Performed by helper: Don/doff right sock, Don/doff left sock     Shoes - Performed by patient: Don/doff right shoe Shoes - Performed by helper: Don/doff left shoe          Lower body assist Assist for lower body dressing:  (mod A)      Toileting Toileting Toileting activity did not occur: No continent bowel/bladder event Toileting steps completed by patient: Adjust clothing prior to toileting, Adjust clothing after toileting Toileting steps completed by helper: Adjust clothing after toileting Toileting Assistive Devices: Grab bar or rail  Toileting assist Assist level: Touching or steadying assistance (Pt.75%)   Transfers Chair/bed transfer   Chair/bed transfer method: Stand pivot Chair/bed transfer assist level: Touching or steadying assistance (Pt > 75%)       Locomotion Ambulation     Max distance: 125 Assist level: Moderate assist (Pt 50 - 74%)   Wheelchair   Type: Manual Max wheelchair distance: 225ft Assist Level: Supervision or verbal cues  Cognition Comprehension Comprehension assist level: Understands basic 25 - 49% of the time/ requires cueing 50 - 75% of the time  Expression Expression assist level: Expresses basic 25 - 49% of the time/requires cueing 50 - 75% of the time. Uses single words/gestures.  Social Interaction Social Interaction assist level: Interacts  appropriately 25 - 49% of time - Needs frequent redirection.  Problem Solving Problem solving assist level: Solves basic less than 25% of the time - needs direction nearly all the time or does not effectively solve problems and may need a restraint for safety  Memory Memory assist level: Recognizes or recalls less than 25% of the time/requires cueing greater than 75% of the time   Medical Problem List and Plan: 1. 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 Status post ORIF of multiple facial fractures 12/08/2015  -continue CIR therapies  -pt demonstrating gradually improved attention and memory 2. DVT Prophylaxis/Anticoagulation: Subcutaneous Lovenox.   -dopplers negative 3. Pain Management: will use tramadol and tylenol for pain.  4. Mood: Seroquel 12.5 mg twice a day--titrate if needed  -ritalin-continue at 10mg  as long as not agitated by dose 5. Neuropsych: This patient is notcapable of making decisions on hisown behalf. 6. Skin/Wound Care: Routine skin checks 7. Fluids/Electrolytes/Nutrition: PO intake pretty good 90-100% 8.Tracheostomy. Decannulated. Monitor for healing. 9.Dysphagia. Gastrostomy tube 12/03/2015. Currently on a dysphagia #1 honey thick liquid diet.  -would stay with soft diet given facial fractures regardless of how much liquids are advanced 9.Hypertension. Norvasc 10 mg daily, Coreg 12.5 mg twice a day. Monitor with increased mobility 10.Nondisplaced right radial styloid fracture. Nonweightbearing 11. Comminuted distal left radius fracture Status post closed reduction. Nonweightbearing--consulted ortho regarding fx's  -Right radial fx healed---dc splint--WBAT  -left forearm fx's healing--await ortho recs--->splint? 12..Urinary retention. Flomax 0.8 mg daily.   -occasionally incontinent  -keflex for E Coli UTI---dc today 13.Constipation. Laxative assistance   LOS (Days) 10 A FACE TO FACE EVALUATION WAS  PERFORMED  Nadean Montanaro T 01/06/2016 12:49 PM

## 2016-01-06 NOTE — Progress Notes (Signed)
Speech Language Pathology Daily Session Note  Patient Details  Name: Justin Fox MRN: [id number] Date of Birth: 02/06/1961  Today's Date: 01/06/2016 SLP Individual Time: 0830-0930 SLP Individual Time Calculation (min): 60 min   Short Term Goals: Week 2: SLP Short Term Goal 1 (Week 2): Patient will orient to place and situation with Max A question and visual cues.  SLP Short Term Goal 2 (Week 2): Patient will demonstrate sustained attention to functional tasks for ~1 minute with Max A verbal cues for redirection.  SLP Short Term Goal 3 (Week 2): Patient will identify 1 cognitive and 1 physical deficit with Max A multimodal cues. SLP Short Term Goal 4 (Week 2): Patient will follow 1 step commands in 75% of opportunities with Min A verbal cues.  SLP Short Term Goal 5 (Week 2): Patient will utilize multiple swallows during trials of nectar-thick liquids with minimal overt s/s of aspiration and Mod A verbal cues.  SLP Short Term Goal 6 (Week 2): Patient will consume current diet with minimal overt s/s of aspiration with Mod A verbal cues for use of swallowing compensatory strategies.   Skilled Therapeutic Interventions:Skilled therapy intervention focused on cognitive and dysphagia goals. Given Mod A verbal cues, patient was able to feed himself Dys. 1 textures honey-thickened water. During consumption of honey-thick water, patient demonstrated wet vocal quality, suspect due to penetration and vallecular residue, and was instructed to cough and use multiple swallows. Patient requires Total A verbal cues for use of multiple swallow technique in every instance of swallowing due to cognitive impairment. Given a basic sorting task, patient required Mod A verbal cues for initiation and selective attention to sort from a field of 2 colours. Patient required Max A verbal and visual cues for initiation, problem solving, and selective attention when separating items by category from a field of 2. Patient  continues to demonstrate language of confusion and perseverates on going to the bathroom. Patient left upright in wheelchair with quick release belt on and family member in room. Continue current plan of care.    Function:  Eating Eating   Modified Consistency Diet: Yes Eating Assist Level: Supervision or verbal cues;Help managing cup/glass           Cognition Comprehension Comprehension assist level: Understands basic 25 - 49% of the time/ requires cueing 50 - 75% of the time  Expression   Expression assist level: Expresses basic 25 - 49% of the time/requires cueing 50 - 75% of the time. Uses single words/gestures.  Social Interaction Social Interaction assist level: Interacts appropriately 25 - 49% of time - Needs frequent redirection.  Problem Solving Problem solving assist level: Solves basic less than 25% of the time - needs direction nearly all the time or does not effectively solve problems and may need a restraint for safety  Memory Memory assist level: Recognizes or recalls less than 25% of the time/requires cueing greater than 75% of the time    Pain Pain Assessment Pain Assessment: No/denies pain Pain Score: 8  Pain Type: Acute pain Pain Location: Head Pain Descriptors / Indicators: Aching Pain Frequency: Constant Pain Onset: Progressive Patients Stated Pain Goal: 2 Pain Intervention(s): Medication (See eMAR);RN made aware  Therapy/Group: Individual Therapy  [name] 01/06/2016, 12:19 PM

## 2016-01-07 ENCOUNTER — Inpatient Hospital Stay (HOSPITAL_COMMUNITY): Payer: 59 | Admitting: Physical Therapy

## 2016-01-07 ENCOUNTER — Inpatient Hospital Stay (HOSPITAL_COMMUNITY): Payer: 59

## 2016-01-07 ENCOUNTER — Inpatient Hospital Stay (HOSPITAL_COMMUNITY): Payer: 59 | Admitting: Speech Pathology

## 2016-01-07 NOTE — Progress Notes (Signed)
Occupational Therapy Note  Patient Details  Name: Justin Fox MRN: [id number] Date of Birth: 1960-08-02  Today's Date: 01/07/2016 OT Individual Time: 1330-1400 OT Individual Time Calculation (min): 30 min    Pt denied pain Individual Therapy  Attempted to engage pt in making Jello pudding.  Pt could not attend adequately enough to read directions and finally stated that he was going to "make any pudding." Pt propelled w/c with BLE back to room and requested to use the toilet with no success.  Pt allowed therapist to begin soaking his L hand to remove dead skin.  Pt agreeable to task for approx 5 mins and stated he needed to "do something." Pt amb without AD and HHA for approx 75' before stating he needed to sit down.  Transitioned to day room and engaged in table task with wooden beads for approx 30 seconds.  Pt continues to require max verbal cues to initiate and attend to tasks. Pt handed off to SLP.   Antoinette, [place] [facility] 01/07/2016, 2:49 PM

## 2016-01-07 NOTE — Progress Notes (Signed)
Occupational Therapy Session Note  Patient Details  Name: Justin Fox MRN: [id number] Date of Birth: 1961/03/20  Today's Date: 01/07/2016 OT Individual Time: 0900-1000 OT Individual Time Calculation (min): 60 min     Short Term Goals: Week 2:  OT Short Term Goal 1 (Week 2): Pt will perform toilet transfers with mod A OT Short Term Goal 2 (Week 2): Pt will maintain sustained attention for 2 minutes during functional task OT Short Term Goal 3 (Week 2): Pt will perform grooming tasks (wash face, brush teetn, and shave) with min A OT Short Term Goal 4 (Week 2): Pt will perform bathing tasks with min A OT Short Term Goal 5 (Week 2): Pt will perform LB dressing tasks with min A  Skilled Therapeutic Interventions/Progress Updates:    Pt initially engaged in BADL retraining including bathing at shower level and dressing with sit<>stand from w/c.  Pt agreeable to shower but immediately stated that "you're killing me" when water touched his skin.  Pt initially stated that the water was too hot but even after temperature decreased to luke warm pt stated that the water hurt and he refused to participate.  Pt would not allow therapist to assist with bathing because it "hurt." Pt returned to room and donned clothing when presented.  Pt amb in room with HHA.  Pt engaged in table tasks with focus on sustained attention, following one step commands, and initiating tasks.  Pt maintains sustained attention approx 15-20 seconds before requiring max verbal cues to redirect.  Pt remained in w/c with QRB in place and at nurses station at end of session.  Therapy Documentation Precautions:  Precautions Precautions: Fall Restrictions Weight Bearing Restrictions: Yes RUE Weight Bearing: Weight bearing as tolerated LUE Weight Bearing: Weight bearing as tolerated General:Pain: Pain Assessment Pain Assessment: No/denies pain Pain Score: 0-No pain  See Function Navigator for Current Functional  Status.   Therapy/Group: Individual Therapy  Rodric, [place] 01/07/2016, 11:00 AM

## 2016-01-07 NOTE — Progress Notes (Signed)
Physical Therapy Note  Patient Details  Name: Justin Fox MRN: [id number] Date of Birth: 1961/02/18 Today's Date: 01/07/2016    Time: 1000-1030 30 minutes  1:1 Pt with no c/o pain.  Pt c/o indigestion, PT provided pt with honey thick ginger ale with cuing to take 2 swallows after each sip.  Pt able to propel w/c with bilat LEs with supervision throughout unit.  Participate in nuts/bolts activity with improved attention up to 20 seconds this treatment.  Pt then perseverating on using restroom but able to be redirected. Pt participated in clothes pin grip and release activity with Lt UE showing adequate grip strength after having cast removed.  Pt passed to SLP at end of session.  Time 2: 1100-1130 30 minutes  1:1 No c/o pain. Pt performed gait 100' x 3 with min A, improved balance with gait this session. Pt requires frequent sitting rest breaks due to impaired attention. Pt performed Nu step x 5 minutes total with max encouragement, pt able to pedal only 5-20 seconds at a time before requiring rest and encouragement to attend to task. Pt able to recall room number and then able to locate room in hallway with mod cuing.  Pt continues to be limited by impaired attention to task requiring max A to participate and attend to all therapeutic activities.   DONAWERTH,KAREN 01/07/2016, 11:30 AM

## 2016-01-07 NOTE — Progress Notes (Signed)
[facility]     PROGRESS NOTE    Subjective/Complaints: Up in w/c. No new complaints. "i'm full of water".   ROS remains  limited by mental status  Objective: Vital Signs: Blood pressure 123/76, pulse 79, temperature 98.4 F (36.9 C), temperature source Oral, resp. rate 16, weight 78.4 kg (172 lb 12.8 oz), SpO2 96 %. No results found. No results for input(s): WBC, HGB, HCT, PLT in the last 72 hours. No results for input(s): NA, K, CL, GLUCOSE, BUN, CREATININE, CALCIUM in the last 72 hours.  Invalid input(s): CO CBG (last 3)  No results for input(s): GLUCAP in the last 72 hours.  Wt Readings from Last 3 Encounters:  01/05/16 78.4 kg (172 lb 12.8 oz)  11/23/15 80 kg (176 lb 5.9 oz)  10/12/15 86.2 kg (190 lb)    Physical Exam:  Constitutional: He appears well-developed. NAD. Eyes: Right eye ptosis.  Conj are normal.  Neck: Normal range of motion. Neck supple.  Trach site healing, small scab still present. Cardiovascular: Normal rateand regular rhythm.  Respiratory: Effort normaland breath sounds normal. No respiratory distress.  GI: Soft. Bowel sounds are normal. He exhibits no distension.  PEG tube in place Neurological: He is awake and fairly alert   Oriented to self only, sometimes hospital. Perseverative at times. more redirectable. Memory improving. Moves all 4's  Sensation appears to be intact to light touch throughout. DTRs 3+ LLE. Musc: no wrist/forearm pain Skin:  Left SAC off. Skin dry.  Psych: Unable to assess to due cognition  Assessment/Plan: 1. 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 Physiatrist is providing close team supervision and 24 hour management of active medical problems listed below. Physiatrist and rehab team continue to assess barriers to discharge/monitor patient  progress toward functional and medical goals.  Function:  Bathing Bathing position Bathing activity did not occur: Refused Position: Production manager parts bathed by patient: Right arm, Left arm, Chest Body parts bathed by helper: Back  Bathing assist Assist Level: Touching or steadying assistance(Pt > 75%)      Upper Body Dressing/Undressing Upper body dressing   What is the patient wearing?: Pull over shirt/dress     Pull over shirt/dress - Perfomed by patient: Thread/unthread right sleeve, Put head through opening, Thread/unthread left sleeve, Pull shirt over trunk Pull over shirt/dress - Perfomed by helper: Pull shirt over trunk        Upper body assist Assist Level: Set up   Set up : To obtain clothing/put away  Lower Body Dressing/Undressing Lower body dressing   What is the patient wearing?: Shoes   Underwear - Performed by helper: Thread/unthread right underwear leg, Thread/unthread left underwear leg, Pull underwear up/down Pants- Performed by patient: Pull pants up/down Pants- Performed by helper: Thread/unthread right pants leg, Thread/unthread left pants leg   Non-skid slipper socks- Performed by helper: Don/doff right sock, Don/doff left sock     Shoes - Performed by patient: Don/doff right shoe Shoes - Performed by helper: Don/doff left shoe          Lower body assist Assist for lower body dressing:  (mod A)      Toileting Toileting Toileting activity did not occur: No continent bowel/bladder event Toileting steps completed by patient: Adjust clothing prior to toileting, Adjust clothing after toileting Toileting steps completed by helper: Adjust clothing after toileting Toileting Assistive  Devices: Grab bar or rail  Toileting assist Assist level: Touching or steadying assistance (Pt.75%)   Transfers Chair/bed transfer   Chair/bed transfer method: Stand pivot Chair/bed transfer assist level: Touching or steadying assistance (Pt > 75%)        Locomotion Ambulation     Max distance: 125 Assist level: Moderate assist (Pt 50 - 74%)   Wheelchair   Type: Manual Max wheelchair distance: 265ft Assist Level: Supervision or verbal cues  Cognition Comprehension Comprehension assist level: Understands basic 25 - 49% of the time/ requires cueing 50 - 75% of the time  Expression Expression assist level: Expresses basic 25 - 49% of the time/requires cueing 50 - 75% of the time. Uses single words/gestures.  Social Interaction Social Interaction assist level: Interacts appropriately 25 - 49% of time - Needs frequent redirection.  Problem Solving Problem solving assist level: Solves basic less than 25% of the time - needs direction nearly all the time or does not effectively solve problems and may need a restraint for safety  Memory Memory assist level: Recognizes or recalls less than 25% of the time/requires cueing greater than 75% of the time   Medical Problem List and Plan: 1. 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 Status post ORIF of multiple facial fractures 12/08/2015  -continue CIR therapies  -gradually improving attention and memory 2. DVT Prophylaxis/Anticoagulation: Subcutaneous Lovenox.   -dopplers negative 3. Pain Management: will use tramadol and tylenol for pain.  4. Mood: Seroquel 12.5 mg twice a day--titrate if needed  -ritalin-continue at 10mg  as long as not agitated by dose 5. Neuropsych: This patient is notcapable of making decisions on hisown behalf. 6. Skin/Wound Care: Routine skin checks 7. Fluids/Electrolytes/Nutrition: PO intake pretty good 90-100% 8.Tracheostomy. Decannulated. Monitor for healing. 9.Dysphagia. Gastrostomy tube 12/03/2015. Currently on a dysphagia #1 honey thick liquid diet.  -would stay with soft diet given facial fractures regardless of how much liquids are advanced 9.Hypertension. Norvasc 10 mg daily, Coreg 12.5 mg twice a day.  Monitor with increased mobility 10.Nondisplaced right radial styloid fracture. Nonweightbearing 11. Comminuted distal left radius fracture Status post closed reduction.    -Right radial fx healed-   -left forearm fx's healing---out of cast  -WBAT bilaterally  -appreciate ortho assist 12..Urinary retention. Flomax 0.8 mg daily.   -occasionally incontinent  -keflex for E Coli UTI---abx completed 13.Constipation. Laxative assistance   LOS (Days) 11 A FACE TO FACE EVALUATION WAS PERFORMED  SWARTZ,ZACHARY T 01/07/2016 9:48 AM

## 2016-01-07 NOTE — Progress Notes (Signed)
Speech Language Pathology Daily Session Notes  Patient Details  Name: Justin Fox MRN: [id number] Date of Birth: 01/11/1961  Today's Date: 01/07/2016  Session 1: SLP Individual Time: 1030-1100 SLP Individual Time Calculation (min): 30 min    Session 2: SLP Individual Time: [id number] SLP Individual Time Calculation (min): 55 min   Short Term Goals: Week 2: SLP Short Term Goal 1 (Week 2): Patient will orient to place and situation with Max A question and visual cues.  SLP Short Term Goal 2 (Week 2): Patient will demonstrate sustained attention to functional tasks for ~1 minute with Max A verbal cues for redirection.  SLP Short Term Goal 3 (Week 2): Patient will identify 1 cognitive and 1 physical deficit with Max A multimodal cues. SLP Short Term Goal 4 (Week 2): Patient will follow 1 step commands in 75% of opportunities with Min A verbal cues.  SLP Short Term Goal 5 (Week 2): Patient will utilize multiple swallows during trials of nectar-thick liquids with minimal overt s/s of aspiration and Mod A verbal cues.  SLP Short Term Goal 6 (Week 2): Patient will consume current diet with minimal overt s/s of aspiration with Mod A verbal cues for use of swallowing compensatory strategies.   Skilled Therapeutic Interventions:  Session 1: Skilled treatment session focused on dysphagia and cognitive goals. SLP facilitated session by providing Max A verbal cues for problem solving with self-feeding and for utilization of swallowing compensatory strategies with trials of nectar-thick liquids via tsp. Patient consumed trials with subtle throat clearing X 2, suspect due to pharyngeal residue. SLP also facilitated session by providing total A for orientation to place, time and situation given choices from a field of 2. Patient also required Max A verbal cues to demonstrate selective attention to self-feeding in a mildly distracting environment for ~30 seconds. Patient handed off to PT. Continue with  current plan of care.   Session 2: Skilled treatment session focused on cognitive goals. SLP facilitated session by providing total A verbal cues for orientation to place, time and situation. Patient with increased lethargy this session and was perseverative on "getting up" and using the bathroom. Patient transferred to commode X 1 during session and was able to successfully void. Patient with increased restlessness that was decreased while propelling himself around the unit in order to locate his room. Patient required Max-Total A verbal, visual and question cues for recall of room number after a 10 second delay and navigation. Patient also appeared to demonstrate increased intellectual awareness of cognitive deficits by reporting "my thinker aint working right." Patient transferred to bed at end of session and left supine in bed with alarm on and all needs within reach. Continue with current plan of care.   Function:  Eating Eating   Modified Consistency Diet: Yes Eating Assist Level: Supervision or verbal cues;Help managing cup/glass;Set up assist for   Eating Set Up Assist For: Opening containers       Cognition Comprehension Comprehension assist level: Understands basic 50 - 74% of the time/ requires cueing 25 - 49% of the time  Expression   Expression assist level: Expresses basic 50 - 74% of the time/requires cueing 25 - 49% of the time. Needs to repeat parts of sentences.  Social Interaction Social Interaction assist level: Interacts appropriately 25 - 49% of time - Needs frequent redirection.  Problem Solving Problem solving assist level: Solves basic less than 25% of the time - needs direction nearly all the time or does not effectively solve  problems and may need a restraint for safety  Memory Memory assist level: Recognizes or recalls less than 25% of the time/requires cueing greater than 75% of the time    Pain No reports of pain but often states he "feels like crap."    Therapy/Group: Individual Therapy  Chelby Salata 01/07/2016, 4:34 PM

## 2016-01-08 ENCOUNTER — Inpatient Hospital Stay (HOSPITAL_COMMUNITY): Payer: 59 | Admitting: Speech Pathology

## 2016-01-08 NOTE — Progress Notes (Signed)
Speech Language Pathology Daily Session Note  Patient Details  Name: Justin Fox MRN: [id number] Date of Birth: 1960-05-20  Today's Date: 01/08/2016 SLP Individual Time: 1400-1445 SLP Individual Time Calculation (min): 45 min   Short Term Goals: Week 2: SLP Short Term Goal 1 (Week 2): Patient will orient to place and situation with Max A question and visual cues.  SLP Short Term Goal 2 (Week 2): Patient will demonstrate sustained attention to functional tasks for ~1 minute with Max A verbal cues for redirection.  SLP Short Term Goal 3 (Week 2): Patient will identify 1 cognitive and 1 physical deficit with Max A multimodal cues. SLP Short Term Goal 4 (Week 2): Patient will follow 1 step commands in 75% of opportunities with Min A verbal cues.  SLP Short Term Goal 5 (Week 2): Patient will utilize multiple swallows during trials of nectar-thick liquids with minimal overt s/s of aspiration and Mod A verbal cues.  SLP Short Term Goal 6 (Week 2): Patient will consume current diet with minimal overt s/s of aspiration with Mod A verbal cues for use of swallowing compensatory strategies.   Skilled Therapeutic Interventions: Skilled treatment session focused on dysphagia and cognition goals. SLP facilitated session by providing trials of nectar via spoon after oral care. Pt consumed without overt s/s of aspiration. Pt intermittently utilized multiple swallows with Max A verbal cues when consuming nectar thick liquids. Despite Max A verbal cues, pt was not able to orient to place and situation or follow 1 step directions. Pt unable to demonstrate sustained attention to functional task for ~1 minute with Max A verbal cues. Pt left in bed with all needs within reach. Continue current plan of care.   Function:  Eating Eating   Modified Consistency Diet: Yes Eating Assist Level: Supervision or verbal cues;Help managing cup/glass;Set up assist for     Helper Scoops Food on Utensil: Every scoop Helper  Brings Food to Mouth: Every scoop   Cognition Comprehension Comprehension assist level: Understands basic 50 - 74% of the time/ requires cueing 25 - 49% of the time  Expression   Expression assist level: Expresses basic 50 - 74% of the time/requires cueing 25 - 49% of the time. Needs to repeat parts of sentences.  Social Interaction Social Interaction assist level: Interacts appropriately 25 - 49% of time - Needs frequent redirection.  Problem Solving Problem solving assist level: Solves basic less than 25% of the time - needs direction nearly all the time or does not effectively solve problems and may need a restraint for safety  Memory Memory assist level: Recognizes or recalls less than 25% of the time/requires cueing greater than 75% of the time    Pain    Therapy/Group: Individual Therapy  Shirin Echeverry 01/08/2016, 3:35 PM

## 2016-01-08 NOTE — Progress Notes (Signed)
  [place] PHYSICAL MEDICINE & REHABILITATION     PROGRESS NOTE    Subjective/Complaints: Alert. Denies complaints. He has trouble answering questions that require more than one word.  Objective: Vital Signs: Blood pressure 114/75, pulse 80, temperature 97.9 F (36.6 C), temperature source Oral, resp. rate 17, weight 172 lb 12.8 oz (78.4 kg), SpO2 97 %.  No acute distress. Neck supple Chest clear to auscultation Cardiac exam S1 and S2 are regular Extremities without edema. Assessment/Plan: 1. 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 Status post ORIF of multiple facial fractures 12/08/2015  -continue CIR therapies  -gradually improving attention and memory 2. DVT Prophylaxis/Anticoagulation: Subcutaneous Lovenox.   -dopplers negative 3. Pain Management: will use tramadol and tylenol for pain.  4. Mood: Reviewed medications. 5. Neuropsych: This patient is notcapable of making decisions on hisown behalf. 6. Skin/Wound Care: Routine skin checks 7. Fluids/Electrolytes/Nutrition: PO intake pretty good 90-100% 8.Tracheostomy. Decannulated. Healing well. 9.Dysphagia. Gastrostomy tube 12/03/2015. Currently on a dysphagia #1 honey thick liquid diet.  -would stay with soft diet given facial fractures regardless of how much liquids are advanced 9.Hypertension. Controlled on current medications. 10.Nondisplaced right radial styloid fracture. Nonweightbearing 11. Comminuted distal left radius fracture Status post closed reduction.    -Right radial fx healed-   -left forearm fx's healing---out of cast  -WBAT bilaterally  -appreciate ortho assist 12..Urinary retention. Flomax 0.8 mg daily.   -occasionally incontinent  -keflex for E Coli UTI---abx completed 13.Constipation. Laxative assistance   LOS (Days) 12 A  FACE TO FACE EVALUATION WAS PERFORMED  Justin Fox Justin Fox 01/08/2016 10:34 AM

## 2016-01-09 ENCOUNTER — Inpatient Hospital Stay (HOSPITAL_COMMUNITY): Payer: 59 | Admitting: Occupational Therapy

## 2016-01-09 ENCOUNTER — Inpatient Hospital Stay (HOSPITAL_COMMUNITY): Payer: 59 | Admitting: Physical Therapy

## 2016-01-09 DIAGNOSIS — R1314 Dysphagia, pharyngoesophageal phase: Secondary | ICD-10-CM

## 2016-01-09 MED ORDER — LINACLOTIDE 145 MCG PO CAPS: 145.0000 ug | ORAL_CAPSULE | Freq: Every day | ORAL | Status: DC

## 2016-01-09 NOTE — Progress Notes (Signed)
Occupational Therapy Session Note  Patient Details  Name: Justin Fox MRN: [id number] Date of Birth: 02/27/1961  Today's Date: 01/09/2016 OT Individual Time:  - 9983-3825 OT Individual Treatment Time Calculation: 59 minutes    Short Term Goals: Week 1:  OT Short Term Goal 1 (Week 1): Pt will perform toilet transfer with mod A in order to decrease level of functional transfers.  OT Short Term Goal 1 - Progress (Week 1): Progressing toward goal OT Short Term Goal 2 (Week 1): Pt will engage in 2 minutes of sustained attention during functional task.  OT Short Term Goal 2 - Progress (Week 1): Progressing toward goal OT Short Term Goal 3 (Week 1): Pt will perform grooming tasks with min A. OT Short Term Goal 3 - Progress (Week 1): Progressing toward goal OT Short Term Goal 4 (Week 1): Pt will perform UB dressing with min A.  OT Short Term Goal 4 - Progress (Week 1): Met Week 2:  OT Short Term Goal 1 (Week 2): Pt will perform toilet transfers with mod A OT Short Term Goal 2 (Week 2): Pt will maintain sustained attention for 2 minutes during functional task OT Short Term Goal 3 (Week 2): Pt will perform grooming tasks (wash face, brush teetn, and shave) with min A OT Short Term Goal 4 (Week 2): Pt will perform bathing tasks with min A OT Short Term Goal 5 (Week 2): Pt will perform LB dressing tasks with min A  Skilled Therapeutic Interventions/Progress Updates:   Pt participated in skilled OT session focusing on attention, initiation, awareness and activity tolerance during self care completion. Pt was received this morning lying in bed, required max encouragement to participate in BADLs. Bathing/dressing completed w/c level at sink with max instructional cues for sequencing. Pt often reported not being able to complete an aspect of the ADL, but with therapeutic distraction and redirection to task, pt was able to complete. Min A for bathing and Min-Mod A for LB dressing for brief and tying  shoelaces due to pt refusal. Toileting transfer and tasks completed with steady assist with HHA for ambulation. Oral care completed with max vcs and pt swallowing toothpaste. Pt was returned to bed and repositioned for comfort. All needs within reach and bed alarm activated at time of departure. No c/o pain.   Therapy Documentation Precautions:  Precautions Precautions: Fall Restrictions Weight Bearing Restrictions: Yes RUE Weight Bearing: Weight bearing as tolerated LUE Weight Bearing: Weight bearing as tolerated Other Position/Activity Restrictions: pt. has cast on left forearm, splint on right forearm - can remove Right splint General:   Vital Signs: Therapy Vitals Temp: 98 F (36.7 C) Temp Source: Oral Pulse Rate: 75 Resp: 16 BP: 116/75 Patient Position (if appropriate): Lying Oxygen Therapy SpO2: 98 % O2 Device: Not Delivered    See Function Navigator for Current Functional Status.   Therapy/Group: Individual Therapy  Tristina Sahagian A Sharlett Lienemann 01/09/2016, 5:49 PM

## 2016-01-09 NOTE — Progress Notes (Signed)
Justin Fox is a 55 y.o. male [birth year] [id number]  Subjective: No new complaints. No new problems. Slept well. Feeling OK.  Objective: Vital signs in last 24 hours: Temp:  [98 F (36.7 C)-98.9 F (37.2 C)] 98 F (36.7 C) (10/08 1354) Pulse Rate:  [75-90] 75 (10/08 1354) Resp:  [16-18] 16 (10/08 1354) BP: (114-120)/(75-80) 116/75 (10/08 1354) SpO2:  [98 %-99 %] 98 % (10/08 1354) Weight change:  Last BM Date: 01/06/16  Intake/Output from previous day: 10/07 0701 - 10/08 0700 In: 2040 [P.O.:2040] Out: 1800 [Urine:1800] Last cbgs: CBG (last 3)  No results for input(s): GLUCAP in the last 72 hours.   Physical Exam General: No apparent distress   HEENT: not dry Lungs: Normal effort. Lungs clear to auscultation, no crackles or wheezes. Cardiovascular: Regular rate and rhythm, no edema Abdomen: S/NT/ND; BS(+) Musculoskeletal:  unchanged Neurological: No new neurological deficits Wounds: healing    Skin: clear  Mental state: Alert, cooperative    Lab Results: BMET    Component Value Date/Time   NA 135 12/28/2015 1319   NA 140 08/13/2015 0828   K 3.8 12/28/2015 1319   CL 103 12/28/2015 1319   CO2 25 12/28/2015 1319   GLUCOSE 115 (H) 12/28/2015 1319   BUN 13 12/28/2015 1319   BUN 19 08/13/2015 0828   CREATININE 0.67 12/28/2015 1319   CALCIUM 9.2 12/28/2015 1319   GFRNONAA >60 12/28/2015 1319   GFRAA >60 12/28/2015 1319   CBC    Component Value Date/Time   WBC 8.4 12/28/2015 1319   RBC 3.82 (L) 12/28/2015 1319   HGB 11.1 (L) 12/28/2015 1319   HCT 34.4 (L) 12/28/2015 1319   HCT 40.6 08/13/2015 0828   PLT 345 12/28/2015 1319   PLT 263 08/13/2015 0828   MCV 90.1 12/28/2015 1319   MCV 92 08/13/2015 0828   MCH 29.1 12/28/2015 1319   MCHC 32.3 12/28/2015 1319   RDW 12.6 12/28/2015 1319   RDW 12.7 08/13/2015 0828   LYMPHSABS 2.0 12/28/2015 1319   LYMPHSABS 1.8 08/13/2015 0828   MONOABS 0.6 12/28/2015 1319   EOSABS 0.3 12/28/2015 1319   EOSABS 0.4  08/13/2015 0828   BASOSABS 0.0 12/28/2015 1319   BASOSABS 0.1 08/13/2015 0828    Studies/Results: No results found.  Medications: I have reviewed the patient's current medications.  Assessment/Plan:  1. 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 Status post ORIF of multiple facial fractures 12/08/2015           -continue CIR therapies           -gradually improving attention and memory 2. DVT Prophylaxis/Anticoagulation: Subcutaneous Lovenox.            -dopplers negative 3. Pain Management: will use tramadol and tylenol for pain.  4. Mood: Reviewed medications. 5. Neuropsych: This patient is notcapable of making decisions on hisown behalf. 6. Skin/Wound Care: Routine skin checks 7. Fluids/Electrolytes/Nutrition: PO intake pretty good 90-100% 8.Tracheostomy. Decannulated. Healing well. 9.Dysphagia. Gastrostomy tube 12/03/2015. Currently on a dysphagia #1 honey thick liquid diet.           -would stay with soft diet given facial fractures regardless of how much liquids are advanced 9.Hypertension. Controlled on current medications. 10.Nondisplaced right radial styloid fracture. Nonweightbearing 11. Comminuted distal left radius fracture Status post closed reduction.             -Right radial fx healed-            -left  forearm fx's healing---out of cast           -WBAT bilaterally           -appreciate ortho assist 12..Urinary retention. Flomax 0.8 mg daily.            -occasionally incontinent           -keflex for E Coli UTI---abx completed 13.Constipation. Laxative assistance  Length of stay, days: 13  Walker Kehr , MD 01/09/2016, 3:50 PM

## 2016-01-09 NOTE — Progress Notes (Addendum)
Justin Fox is a 55 y.o. male 05-Jul-1960 [id number]  Subjective:  No other new problems. Slept well. Feeling OK.   Objective: Vital signs in last 24 hours: Temp:  [98 F (36.7 C)-98.9 F (37.2 C)] 98 F (36.7 C) (10/08 1354) Pulse Rate:  [75-90] 75 (10/08 1354) Resp:  [16-18] 16 (10/08 1354) BP: (114-120)/(75-80) 116/75 (10/08 1354) SpO2:  [98 %-99 %] 98 % (10/08 1354) Weight change:  Last BM Date: 01/06/16  Intake/Output from previous day: 10/07 0701 - 10/08 0700 In: 2040 [P.O.:2040] Out: 1800 [Urine:1800] Last cbgs: CBG (last 3)  No results for input(s): GLUCAP in the last 72 hours.   Physical Exam General: No apparent distress   HEENT: not dry Lungs: Normal effort. Lungs clear to auscultation, no crackles or wheezes. Cardiovascular: Regular rate and rhythm, no edema Abdomen: S/NT/ND; BS(+) Musculoskeletal:  unchanged Neurological: No new neurological deficits  Skin: clear   Mental state: Alert, cooperative    Lab Results: BMET    Component Value Date/Time   NA 135 12/28/2015 1319   NA 140 08/13/2015 0828   K 3.8 12/28/2015 1319   CL 103 12/28/2015 1319   CO2 25 12/28/2015 1319   GLUCOSE 115 (H) 12/28/2015 1319   BUN 13 12/28/2015 1319   BUN 19 08/13/2015 0828   CREATININE 0.67 12/28/2015 1319   CALCIUM 9.2 12/28/2015 1319   GFRNONAA >60 12/28/2015 1319   GFRAA >60 12/28/2015 1319   CBC    Component Value Date/Time   WBC 8.4 12/28/2015 1319   RBC 3.82 (L) 12/28/2015 1319   HGB 11.1 (L) 12/28/2015 1319   HCT 34.4 (L) 12/28/2015 1319   HCT 40.6 08/13/2015 0828   PLT 345 12/28/2015 1319   PLT 263 08/13/2015 0828   MCV 90.1 12/28/2015 1319   MCV 92 08/13/2015 0828   MCH 29.1 12/28/2015 1319   MCHC 32.3 12/28/2015 1319   RDW 12.6 12/28/2015 1319   RDW 12.7 08/13/2015 0828   LYMPHSABS 2.0 12/28/2015 1319   LYMPHSABS 1.8 08/13/2015 0828   MONOABS 0.6 12/28/2015 1319   EOSABS 0.3 12/28/2015 1319   EOSABS 0.4 08/13/2015 0828   BASOSABS 0.0  12/28/2015 1319   BASOSABS 0.1 08/13/2015 0828    Studies/Results: No results found.  Medications: I have reviewed the patient's current medications.  Assessment/Plan:  1. 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 Status post ORIF of multiple facial fractures 12/08/2015           -continue CIR therapies           -gradually improving attention and memory 2. DVT Prophylaxis/Anticoagulation: Subcutaneous Lovenox.            -dopplers negative 3. Pain Management: will use tramadol and tylenol for pain.  4. Mood: Reviewed medications. 5. Neuropsych: This patient is notcapable of making decisions on hisown behalf. 6. Skin/Wound Care: Routine skin checks 7. Fluids/Electrolytes/Nutrition: PO intake pretty good 90-100% 8.Tracheostomy. Decannulated. Healing well. 9.Dysphagia. Gastrostomy tube 12/03/2015. Currently on a dysphagia #1 honey thick liquid diet.           -would stay with soft diet given facial fractures regardless of how much liquids are advanced 9.Hypertension. Controlled on current medications. 10.Nondisplaced right radial styloid fracture. Nonweightbearing 11. Comminuted distal left radius fracture Status post closed reduction.             -Right radial fx healed-            -left forearm fx's healing---out  of cast           -WBAT bilaterally           -appreciate ortho assist 12..Urinary retention. Flomax 0.8 mg daily.            -occasionally incontinent           -keflex for E Coli UTI---abx completed   Length of stay, days: 13  Walker Kehr , MD 01/09/2016, 3:53 PM

## 2016-01-10 ENCOUNTER — Inpatient Hospital Stay (HOSPITAL_COMMUNITY): Payer: 59

## 2016-01-10 ENCOUNTER — Inpatient Hospital Stay (HOSPITAL_COMMUNITY): Payer: 59 | Admitting: Physical Therapy

## 2016-01-10 ENCOUNTER — Inpatient Hospital Stay (HOSPITAL_COMMUNITY): Payer: 59 | Admitting: Speech Pathology

## 2016-01-10 NOTE — Progress Notes (Signed)
[facility] PHYSICAL MEDICINE & REHABILITATION     PROGRESS NOTE    Subjective/Complaints: Sitting in bed. NT trying to coax him into eating more breakfast  ROS still somewhat limited due to mental status  Objective: Vital Signs: Blood pressure 130/88, pulse 88, temperature 98.3 F (36.8 C), temperature source Oral, resp. rate 16, weight 78.4 kg (172 lb 12.8 oz), SpO2 96 %. No results found. No results for input(s): WBC, HGB, HCT, PLT in the last 72 hours. No results for input(s): NA, K, CL, GLUCOSE, BUN, CREATININE, CALCIUM in the last 72 hours.  Invalid input(s): CO CBG (last 3)  No results for input(s): GLUCAP in the last 72 hours.  Wt Readings from Last 3 Encounters:  01/05/16 78.4 kg (172 lb 12.8 oz)  11/23/15 80 kg (176 lb 5.9 oz)  10/12/15 86.2 kg (190 lb)    Physical Exam:  Constitutional: He appears well-developed. NAD. Eyes: Right eye ptosis.  Conj are normal.  Neck: Normal range of motion. Neck supple.  Trach site healed Cardiovascular: Normal rateand regular rhythm.  Respiratory: Effort normaland breath sounds normal. No respiratory distress.  GI: Soft. Bowel sounds are normal. He exhibits no distension.  PEG tube in place Neurological: He is awake and fairly alert   Oriented to self only, sometimes hospital. Perseverative at times. more redirectable. Memory improving. Moves all 4's  Sensation appears to be intact to light touch throughout. DTRs 3+ LLE. Musc: no wrist/forearm pain Skin:  2 retained sutures on scalp incision  Psych: Unable to assess to due cognition  Assessment/Plan: 1. 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 Physiatrist is providing close team supervision and 24 hour management of active medical problems listed below. Physiatrist and rehab team continue to assess barriers to discharge/monitor  patient progress toward functional and medical goals.  Function:  Bathing Bathing position Bathing activity did not occur: Refused Position: Wheelchair/chair at sink  Bathing parts Body parts bathed by patient: Right arm, Left arm, Chest, Abdomen, Front perineal area, Right upper leg, Left upper leg, Right lower leg, Left lower leg Body parts bathed by helper: Buttocks, Back  Bathing assist Assist Level: Supervision or verbal cues      Upper Body Dressing/Undressing Upper body dressing   What is the patient wearing?: Pull over shirt/dress     Pull over shirt/dress - Perfomed by patient: Thread/unthread right sleeve, Thread/unthread left sleeve, Put head through opening, Pull shirt over trunk Pull over shirt/dress - Perfomed by helper: Pull shirt over trunk        Upper body assist Assist Level: Supervision or verbal cues   Set up : To obtain clothing/put away  Lower Body Dressing/Undressing Lower body dressing   What is the patient wearing?: Underwear, Pants, [employer], Shoes Underwear - Performed by patient: Pull underwear up/down Underwear - Performed by helper: Thread/unthread right underwear leg, Thread/unthread left underwear leg Pants- Performed by patient: Thread/unthread right pants leg, Thread/unthread left pants leg, Pull pants up/down Pants- Performed by helper: Thread/unthread right pants leg, Thread/unthread left pants leg   Non-skid slipper socks- Performed by helper: Don/doff right sock, Don/doff left sock     Shoes - Performed by patient: Don/doff right shoe, Don/doff left shoe Shoes - Performed by helper: Fasten right, Fasten left       TED Hose - Performed by helper: Don/doff right TED hose, Don/doff left TED hose  Lower body assist  Assist for lower body dressing: Touching or steadying assistance (Pt > 75%)      Toileting Toileting Toileting activity did not occur: No continent bowel/bladder event Toileting steps completed by patient: Adjust clothing  prior to toileting, Adjust clothing after toileting Toileting steps completed by helper: Performs perineal hygiene, Adjust clothing after toileting, Adjust clothing prior to toileting [place]: Grab bar or rail  Toileting assist Assist level: Touching or steadying assistance (Pt.75%)   Transfers Chair/bed transfer   Chair/bed transfer method: Stand pivot Chair/bed transfer assist level: Touching or steadying assistance (Pt > 75%)       Locomotion Ambulation     Max distance: 125 Assist level: Moderate assist (Pt 50 - 74%)   Wheelchair   Type: Manual Max wheelchair distance: 284ft Assist Level: Supervision or verbal cues  Cognition Comprehension Comprehension assist level: Understands basic 50 - 74% of the time/ requires cueing 25 - 49% of the time  Expression Expression assist level: Expresses basic 25 - 49% of the time/requires cueing 50 - 75% of the time. Uses single words/gestures.  Social Interaction Social Interaction assist level: Interacts appropriately 25 - 49% of time - Needs frequent redirection.  Problem Solving Problem solving assist level: Solves basic less than 25% of the time - needs direction nearly all the time or does not effectively solve problems and may need a restraint for safety  Memory Memory assist level: Recognizes or recalls less than 25% of the time/requires cueing greater than 75% of the time   Medical Problem List and Plan: 1. 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 Status post ORIF of multiple facial fractures 12/08/2015  -continue CIR therapies  -improved awareness and attention 2. DVT Prophylaxis/Anticoagulation: Subcutaneous Lovenox.   -dopplers negative 3. Pain Management: will use tramadol and tylenol for pain.  4. Mood: Seroquel 12.5 mg twice a day--titrate if needed  -ritalin-continue at 10mg  as long as not agitated by dose 5. Neuropsych: This patient is notcapable  of making decisions on hisown behalf. 6. Skin/Wound Care: Routine skin checks  -may try to remove retained suture on scalp this week 7. Fluids/Electrolytes/Nutrition: PO reasonable 8.Tracheostomy. Decannulated. Monitor for healing. 9.Dysphagia. Gastrostomy tube 12/03/2015. Currently on a dysphagia #1 honey thick liquid diet.  -would stay with soft diet given facial fractures regardless of how much liquids are advanced 9.Hypertension. Norvasc 10 mg daily, Coreg 12.5 mg twice a day. Monitor with increased mobility 10.Nondisplaced right radial styloid fracture. Nonweightbearing 11. Comminuted distal left radius fracture Status post closed reduction.    -Right radial fx healed-   -left forearm fx's healing---out of cast  -WBAT bilaterally  -appreciate ortho assist 12..Urinary retention. Flomax 0.8 mg daily.   -occasionally incontinent  -keflex for E Coli UTI---abx completed 13.Constipation. Laxative assistance   LOS (Days) 14 A FACE TO FACE EVALUATION WAS PERFORMED  SWARTZ,ZACHARY T 01/10/2016 9:08 AM

## 2016-01-10 NOTE — Progress Notes (Signed)
Occupational Therapy Session Note  Patient Details  Name: Justin Fox MRN: [id number] Date of Birth: 18-Jan-1961  Today's Date: 01/10/2016 OT Individual Time: 1115-1200 OT Individual Time Calculation (min): 45 min   Short Term Goals: Week 2:  OT Short Term Goal 1 (Week 2): Pt will perform toilet transfers with mod A OT Short Term Goal 2 (Week 2): Pt will maintain sustained attention for 2 minutes during functional task OT Short Term Goal 3 (Week 2): Pt will perform grooming tasks (wash face, brush teetn, and shave) with min A OT Short Term Goal 4 (Week 2): Pt will perform bathing tasks with min A OT Short Term Goal 5 (Week 2): Pt will perform LB dressing tasks with min A  Skilled Therapeutic Interventions/Progress Updates: Therapeutic activity with focus on improved sustained attention, grooming, self-care (skin care using lotion).   Pt received supine in bed with spouse present.   Pt sustained conversation topic relating to his marriage (3rd) and religious conversion experience with OT for 21 minutes with moderate intermittent redirection and facilitation from spouse and therapist to clarify and correct orientation to past dates and events.   Pt was then educated on use of "universal remover wipes" and lotion to attend to skin care of arms and legs, supine and sitting at edge of bed.   Pt sustained attention to task for 90 seconds, 3 times, with max instructional cues.   Pt then attempted shaving while supine with HOB elevated and progressed to sitting in w/c with mod assist to complete bed mobility and transfer d/t ataxia.   Pt remained in w/c and was presented to his wife at end of session during short excursion with OT to family room.  Overall patient requires supervision to engage and moderate assist to perform self-care with cues for sequencing and sustained attention.  Therapy Documentation Precautions:  Precautions Precautions: Fall Restrictions Weight Bearing Restrictions: Yes RUE  Weight Bearing: Weight bearing as tolerated LUE Weight Bearing: Weight bearing as tolerated Other Position/Activity Restrictions: pt. has cast on left forearm, splint on right forearm - can remove Right splint   Vital Signs: Therapy Vitals Pulse Rate: 88 BP: 130/88   Pain: No/denies pain  See Function Navigator for Current Functional Status.   Therapy/Group: Individual Therapy  [place] 01/10/2016, 12:05 PM

## 2016-01-10 NOTE — Progress Notes (Signed)
Physical Therapy Session Note  Patient Details  Name: Justin Fox MRN: [id number] Date of Birth: 04/06/1960  Today's Date: 01/10/2016 PT Individual Time: 1553-1701 PT Individual Time Calculation (min): 68 min    Short Term Goals: Week 2:  PT Short Term Goal 1 (Week 2): Pt will sustainn attention to funcitonal task x 30 seconds with supervision PT Short Term Goal 2 (Week 2): Pt will consistently perform gait up to 25' with mod A PT Short Term Goal 3 (Week 2): Pt will consistently perform functional transfers with min A  Skilled Therapeutic Interventions/Progress Updates:    Pt received in bed & agreeable to PT, noting pain in stomach, head & neck & RN made aware. Pt able to transfer in/out of bed with supervision overall with max cuing for initiation. Pt ambulated to bathroom with min assist & no AD, completing toilet transfer with min assist & (+) void. Throughout session pt propelled w/c with BLE & supervision overall; pt also ambulated 100 ft + 200 ft with min assist with therapist providing support for balance & pt demonstrating more consistent step length & width with decreased sway. Throughout session attempted to have pt demonstrate sustained attention to task. Pt able to maintain attention to sorting bean bags by color for 30-45 seconds max with cuing, otherwise pt's attention span was 10-20 seconds at most. Pt required max cuing to select appropriate bean bag (fruit, vegetable, etc) and max cuing to select appropriate pieces to build most simple pipe tree shape. Pt noted pipe tree assembly caused his head to hurt. Pt easily frustrated with cognitive tasks on this date requiring rest breaks. Pt reported need to have BM but once on toilet in room pt unable to have BM. Pt able to recall therapist's name correctly, when given the first letter, 30% of the time. Pt able to recall Halloween occurs in month of October 1/2 times. Throughout session therapist attempted to passively orient pt to  location [facility]) & to speak quietly inside hospital. Throughout session pt repeatedly stated "I'm gonna kill you" with therapist educating pt on inappropriate comments. Pt required max cuing to take small sips of honey thick drink with poor demo by pt; pt also required max cuing to feed himself small bites of pudding. Pt able to don socks with max cuing to initiate task. At end of session pt left in bed with all needs within reach & bed alarm set.   Therapy Documentation Precautions:  Precautions Precautions: Fall Restrictions Weight Bearing Restrictions: Yes RUE Weight Bearing: Weight bearing as tolerated LUE Weight Bearing: Weight bearing as tolerated Other Position/Activity Restrictions: pt. has cast on left forearm, splint on right forearm - can remove Right splint   See Function Navigator for Current Functional Status.   Therapy/Group: Individual Therapy  [name] 01/10/2016, 5:16 PM

## 2016-01-10 NOTE — Progress Notes (Signed)
Occupational Therapy Session Note  Patient Details  Name: Justin Fox MRN: [id number] Date of Birth: 01/24/1961  Today's Date: 01/10/2016 OT Individual Time: [id number] OT Individual Time Calculation (min): 45 min  and Today's Date: 01/10/2016 OT Missed Time: 15 Minutes Missed Time Reason: Patient unwilling/refused to participate without medical reason;Patient fatigue     Short Term Goals: Week 2:  OT Short Term Goal 1 (Week 2): Pt will perform toilet transfers with mod A OT Short Term Goal 2 (Week 2): Pt will maintain sustained attention for 2 minutes during functional task OT Short Term Goal 3 (Week 2): Pt will perform grooming tasks (wash face, brush teetn, and shave) with min A OT Short Term Goal 4 (Week 2): Pt will perform bathing tasks with min A OT Short Term Goal 5 (Week 2): Pt will perform LB dressing tasks with min A  Skilled Therapeutic Interventions/Progress Updates:    Pt resting in bed upon arrival.  Attempted to engage patient in BADL retraining but pt resistant to any bathing tasks.  Pt did participate in dressing tasks while intermittently sitting EOB and standing at bedside to pull up pants.  Pt not oriented to place, situation, or date.  Pt continues to confuse therapist with friends from home.  Pt continues to resist consistent active participation in therapy. Pt remained in bed with all needs within reach and bed alarm activated.   Therapy Documentation Precautions:  Precautions Precautions: Fall Restrictions Weight Bearing Restrictions: Yes RUE Weight Bearing: Weight bearing as tolerated LUE Weight Bearing: Weight bearing as tolerated Other Position/Activity Restrictions: pt. has cast on left forearm, splint on right forearm - can remove Right splint General: General OT Amount of Missed Time: 15 Minutes VPain:    See Function Navigator for Current Functional Status.   Therapy/Group: Individual Therapy  [place], Romo 01/10/2016, 10:03 AM

## 2016-01-10 NOTE — Progress Notes (Signed)
Speech Language Pathology Daily Session Note  Patient Details  Name: Justin Fox MRN: [id number] Date of Birth: 1960-04-17  Today's Date: 01/10/2016 SLP Individual Time: 24-1450 SLP Individual Time Calculation (min): 53 min   Short Term Goals: Week 2: SLP Short Term Goal 1 (Week 2): Patient will orient to place and situation with Max A question and visual cues.  SLP Short Term Goal 2 (Week 2): Patient will demonstrate sustained attention to functional tasks for ~1 minute with Max A verbal cues for redirection.  SLP Short Term Goal 3 (Week 2): Patient will identify 1 cognitive and 1 physical deficit with Max A multimodal cues. SLP Short Term Goal 4 (Week 2): Patient will follow 1 step commands in 75% of opportunities with Min A verbal cues.  SLP Short Term Goal 5 (Week 2): Patient will utilize multiple swallows during trials of nectar-thick liquids with minimal overt s/s of aspiration and Mod A verbal cues.  SLP Short Term Goal 6 (Week 2): Patient will consume current diet with minimal overt s/s of aspiration with Mod A verbal cues for use of swallowing compensatory strategies.   Skilled Therapeutic Interventions: Skilled treatment session focused on cognitive and dysphagia goals. Upon arrival, patient awake while supine in bed and required Max encouragement for participation. Patient was incontinent of urine and required Max A verbal and tactile cues for focused attention for ~30 seconds and sequencing/problem solving during self-care tasks of changing his clothes and donning a brief due to constantly trying to return to supine in bed. SLP eventually convinced patient to get out of bed and patient consumed trials of nectar-thick liquids via tsp without overt s/s of aspiration with Max A verbal cues needed for use of multiple swallows. Patient requested to use the bathroom and was continent of bowel with Max A verbal and tactile cues needed for focused attention to task for ~30 seconds. Patient  also required Min A verbal cues for utilization of external memory aids to orient to place and month and total A for orientation to situation. Patient with increased verbal frustration today, however, apologized at end of session for being a "butt head and ornery." Patient left supine in bed with alarm on and all needs within reach. Continue with current plan of care.   Function:  Eating Eating   Modified Consistency Diet: Yes Eating Assist Level: Supervision or verbal cues;Set up assist for;Helper feeds patient   Eating Set Up Assist For: Opening containers       Cognition Comprehension Comprehension assist level: Understands basic 50 - 74% of the time/ requires cueing 25 - 49% of the time  Expression   Expression assist level: Expresses basic 25 - 49% of the time/requires cueing 50 - 75% of the time. Uses single words/gestures.  Social Interaction Social Interaction assist level: Interacts appropriately 25 - 49% of time - Needs frequent redirection.  Problem Solving Problem solving assist level: Solves basic less than 25% of the time - needs direction nearly all the time or does not effectively solve problems and may need a restraint for safety  Memory Memory assist level: Recognizes or recalls less than 25% of the time/requires cueing greater than 75% of the time    Pain No/Denies Pain   Therapy/Group: Individual Therapy  Judson Tsan, [place] 01/10/2016, 3:47 PM

## 2016-01-10 NOTE — Progress Notes (Signed)
Occupational Therapy Note  Patient Details  Name: Justin Fox MRN: [id number] Date of Birth: 01/09/1961  Today's Date: 01/10/2016 OT Individual Time: 1300-1330 OT Individual Time Calculation (min): 30 min    Pt denied pain Individual Therapy  Pt resting in bed upon arrival.  Pt continues to refer to therapist as "Doc" despite constant correction each day.  Pt not oriented to month, year, or day of week.  Sign provided with Month and Year posted in room to assist pt with orientation.  When offered clue that Halloween was celebrated in this month, pt offered July as his answer.  Attempted to have pt sit EOB X 4 and pt would only sit approx 5 seconds.  Pt stated his "thinker wasn't thinking" but also requested that he wanted to go home tomorrow.  Reeducated pt that his "thinker" needed to work better before he went home.  Pt declined to participate in any self care tasks.   [name], [name] [facility] 01/10/2016, 3:09 PM

## 2016-01-10 NOTE — Plan of Care (Signed)
Problem: RH KNOWLEDGE DEFICIT BRAIN INJURY Goal: RH STG INCREASE KNOWLEDGE OF DYSPHAGIA/FLUID INTAKE Patient and patient's caregiver with increase knowledge of dysphagia/fluid intake with swallowing precautions, thickened liquid prescribed by MD with minimal assistance.  Outcome: Progressing Encouraged fluids, pt takes extra fluids with assist

## 2016-01-11 ENCOUNTER — Inpatient Hospital Stay (HOSPITAL_COMMUNITY): Payer: 59 | Admitting: Physical Therapy

## 2016-01-11 ENCOUNTER — Inpatient Hospital Stay (HOSPITAL_COMMUNITY): Payer: 59 | Admitting: Speech Pathology

## 2016-01-11 ENCOUNTER — Inpatient Hospital Stay (HOSPITAL_COMMUNITY): Payer: 59

## 2016-01-11 NOTE — Progress Notes (Signed)
Occupational Therapy Session Note  Patient Details  Name: MAGDIEL NUTTING MRN: [id number] Date of Birth: 03-27-1961  Today's Date: 01/11/2016 OT Individual Time: 0800-0900 OT Individual Time Calculation (min): 60 min     Short Term Goals: Week 2:  OT Short Term Goal 1 (Week 2): Pt will perform toilet transfers with mod A OT Short Term Goal 2 (Week 2): Pt will maintain sustained attention for 2 minutes during functional task OT Short Term Goal 3 (Week 2): Pt will perform grooming tasks (wash face, brush teetn, and shave) with min A OT Short Term Goal 4 (Week 2): Pt will perform bathing tasks with min A OT Short Term Goal 5 (Week 2): Pt will perform LB dressing tasks with min A  Skilled Therapeutic Interventions/Progress Updates:    Pt in bathroom with RN attending.  Pt required tot A for toileting tasks. Pt initially engaged in dressing tasks.  Pt adamantly declined bathing tasks this morning.  Pt also engaged in shaving task while seated.  Pt attempted shaving but unable/unwilling to complete task.  Task completed by therapist.  Pt propelled to therapy gym and initially engaged in Nustep activity.  Pt unable to complete goal of 5 mins with rest break every 1 minute.  Pt required max verbal cues to redirect and continue with activity.  Pt transitioned to table task with PVC pipes.  Pt able to assemble two simple structures after correct pieces selected.  Pt required max verbal cues to attend to tasks.  Pt propelled to room and returned to bed and remained with bed alarm activated. Therapy Documentation Precautions:  Precautions Precautions: Fall Restrictions Weight Bearing Restrictions: Yes RUE Weight Bearing: Weight bearing as tolerated LUE Weight Bearing: Weight bearing as tolerated Other Position/Activity Restrictions: pt. has cast on left forearm, splint on right forearm - can remove Right splint Pain: Pain Assessment Pain Assessment: No/denies pain Pain Score: 0-No pain  See  Function Navigator for Current Functional Status.   Therapy/Group: Individual Therapy  Lakendric, [place] 01/11/2016, 9:04 AM

## 2016-01-11 NOTE — Progress Notes (Signed)
Speech Language Pathology Daily Session Note  Patient Details  Name: Justin Fox MRN: [id number] Date of Birth: 1961/01/10  Today's Date: 01/11/2016 SLP Individual Time: [id number] SLP Individual Time Calculation (min): 55 min   Short Term Goals: Week 2: SLP Short Term Goal 1 (Week 2): Patient will orient to place and situation with Max A question and visual cues.  SLP Short Term Goal 2 (Week 2): Patient will demonstrate sustained attention to functional tasks for ~1 minute with Max A verbal cues for redirection.  SLP Short Term Goal 3 (Week 2): Patient will identify 1 cognitive and 1 physical deficit with Max A multimodal cues. SLP Short Term Goal 4 (Week 2): Patient will follow 1 step commands in 75% of opportunities with Min A verbal cues.  SLP Short Term Goal 5 (Week 2): Patient will utilize multiple swallows during trials of nectar-thick liquids with minimal overt s/s of aspiration and Mod A verbal cues.  SLP Short Term Goal 6 (Week 2): Patient will consume current diet with minimal overt s/s of aspiration with Mod A verbal cues for use of swallowing compensatory strategies.   Skilled Therapeutic Interventions: Skilled therapy intervention focused on dysphagia and cognitive goals. Patient given trials of nectar-thick liquid via teaspoon with no overt s/s of aspiration. Patient given Max A verbal cues for utilization of multiple swallows as compensatory strategy. Given a structured card task, patient initially required Min A verbal cues for about 60 seconds selective attention to task in a moderately distracting environment, however cueing increased to Max A as task progressed. Patient independently recalled the names of two of his regular therapists. Patient still demonstrates language of confusion and is perseverative on going to the bathroom. Patient taken to the bathroom and was able to void, however perseveration continued throughout remainder of session. Patient left supine in bed with  bed alarm on and all needs within reach. Continue current plan of care.   Function:  Eating Eating   Modified Consistency Diet: Yes Eating Assist Level: Supervision or verbal cues   Eating Set Up Assist For: Opening containers Helper Scoops Food on Utensil: Occasionally Helper Brings Food to Mouth: Occasionally   Cognition Comprehension Comprehension assist level: Understands basic 50 - 74% of the time/ requires cueing 25 - 49% of the time  Expression   Expression assist level: Expresses basic 25 - 49% of the time/requires cueing 50 - 75% of the time. Uses single words/gestures.  Social Interaction Social Interaction assist level: Interacts appropriately less than 25% of the time. May be withdrawn or combative.  Problem Solving Problem solving assist level: Solves basic less than 25% of the time - needs direction nearly all the time or does not effectively solve problems and may need a restraint for safety  Memory Memory assist level: Recognizes or recalls less than 25% of the time/requires cueing greater than 75% of the time    Pain Pain Assessment Pain Assessment: No/denies pain Faces Pain Scale: No hurt  Therapy/Group: Individual Therapy  [name] 01/11/2016, 3:01 PM

## 2016-01-11 NOTE — Progress Notes (Signed)
Physical Therapy Weekly Progress Note  Patient Details  Name: Justin Fox MRN: [id number] Date of Birth: 19-Jun-1960  Beginning of progress report period: January 04, 2016 End of progress report period: January 11, 2016  Today's Date: 01/11/2016 PT Individual Time: 1634-1700 PT Individual Time Calculation (min): 26 min    Patient has met 2 of 3 short term goals.  Pt is making good progress towards functional mobility goals, as he is now able to ambulate 150 ft without AD & min assist as well as negotiate 12 steps with B rails & mod assist. Pt is able to complete transfers with min assist overall and bed mobility with mod I. Pt continues to require max cuing for safety with all tasks and pt remains impulsive. Pt requires max cuing to sustain attention to cognitive task for maximum of 2 minutes.   Patient continues to demonstrate the following deficits: decreased safety awareness, decreased coordination, decreased dynamic standing balance, decreased ability to negotiate stairs, poor insight & impulsiveness and therefore will continue to benefit from skilled PT intervention to enhance overall performance with activity tolerance, balance, postural control, attention, awareness, coordination and pt & family education.  Patient progressing toward long term goals..  Continue plan of care.  PT Short Term Goals Week 2:  PT Short Term Goal 1 (Week 2): Pt will sustainn attention to funcitonal task x 30 seconds with supervision PT Short Term Goal 1 - Progress (Week 2): Not met (Pt unable to sustain attention to functional task without max cuing.) PT Short Term Goal 2 (Week 2): Pt will consistently perform gait up to 25' with mod A PT Short Term Goal 2 - Progress (Week 2): Met (Pt ambulates 150 ft with min assist.) PT Short Term Goal 3 (Week 2): Pt will consistently perform functional transfers with min A PT Short Term Goal 3 - Progress (Week 2): Met Week 3:  PT Short Term Goal 1 (Week 3): Pt will  ambulate 50 ft with supervision assist. PT Short Term Goal 2 (Week 3): Pt will negotiate 12 steps with min assist & B rails. PT Short Term Goal 3 (Week 3): Pt will negotiate single step without rails & mod assist. PT Short Term Goal 4 (Week 3): Pt will sustain attention to functional task for 30 seconds with minimal cuing.    [name] 01/11/2016, 5:27 PM

## 2016-01-11 NOTE — Progress Notes (Signed)
Occupational Therapy Note  Patient Details  Name: Justin Fox MRN: [id number] Date of Birth: [birth year]  Today's Date: 01/11/2016 OT Individual Time: 1100-1200 OT Individual Time Calculation (min): 60 min    Pt initially denied pain but later c/o bilateral hand pain with activity; RN aware Individual Therapy  Pt resting in bed upon arrival with daughter present.  Pt required max encouragement to participate in therapy.  Pt initially propelled w/c to gym and engaged in assembling structures with PVC pipes.  Pt required min verbal cues for selection of correct pieces and max verbal cues to redirect to task.  Pt transitioned to day room and engaged in activities with focus on increasing bilateral hand strength and cognitive remediation.  Pt requested to use the bathroom and was successful with voiding.  Pt returned to table tasks before walking to room with min A.  Pt required max verbal cues to remember room number and locate room utilizing signs in hallway.  Pt remained in w/c with QRB in place and daughter present.    [place], [facility] 01/11/2016, 12:14 PM

## 2016-01-11 NOTE — Progress Notes (Signed)
[place] PHYSICAL MEDICINE & REHABILITATION     PROGRESS NOTE    Subjective/Complaints: Getting clothing on. Had a good night. Recognized me when I entered.  ROS still somewhat limited due to mental status  Objective: Vital Signs: Blood pressure 117/77, pulse 68, temperature 97.9 F (36.6 C), temperature source Oral, resp. rate 18, weight 78.4 kg (172 lb 12.8 oz), SpO2 99 %. No results found. No results for input(s): WBC, HGB, HCT, PLT in the last 72 hours. No results for input(s): NA, K, CL, GLUCOSE, BUN, CREATININE, CALCIUM in the last 72 hours.  Invalid input(s): CO CBG (last 3)  No results for input(s): GLUCAP in the last 72 hours.  Wt Readings from Last 3 Encounters:  01/05/16 78.4 kg (172 lb 12.8 oz)  11/23/15 80 kg (176 lb 5.9 oz)  10/12/15 86.2 kg (190 lb)    Physical Exam:  Constitutional: He appears well-developed. NAD. Eyes: Right eye ptosis.  Conj are normal.  Neck: Normal range of motion. Neck supple.  Trach site healed Cardiovascular: Normal rateand regular rhythm.  Respiratory: Effort normaland breath sounds normal. No respiratory distress.  GI: Soft. Bowel sounds are normal. He exhibits no distension.  PEG tube in place Neurological: He is awake and fairly alert   Oriented to self only, sometimes hospital. Perseverative at times. more redirectable. Memory improving. Moves all 4's  Sensation appears to be intact to light touch throughout. DTRs 3+ LLE. Musc: no wrist/forearm pain Skin:  2 retained sutures on scalp incision  Psych: Unable to assess to due cognition  Assessment/Plan: 1. 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 Physiatrist is providing close team supervision and 24 hour management of active medical problems listed below. Physiatrist and rehab team continue to assess barriers to  discharge/monitor patient progress toward functional and medical goals.  Function:  Bathing Bathing position Bathing activity did not occur: Refused Position: Wheelchair/chair at sink  Bathing parts Body parts bathed by patient: Right arm, Left arm, Chest, Abdomen, Front perineal area, Right upper leg, Left upper leg, Right lower leg, Left lower leg Body parts bathed by helper: Buttocks, Back  Bathing assist Assist Level: Supervision or verbal cues      Upper Body Dressing/Undressing Upper body dressing   What is the patient wearing?: Pull over shirt/dress     Pull over shirt/dress - Perfomed by patient: Thread/unthread right sleeve, Thread/unthread left sleeve, Put head through opening, Pull shirt over trunk Pull over shirt/dress - Perfomed by helper: Pull shirt over trunk        Upper body assist Assist Level: Supervision or verbal cues   Set up : To obtain clothing/put away  Lower Body Dressing/Undressing Lower body dressing   What is the patient wearing?: Underwear, Pants, [employer], Shoes Underwear - Performed by patient: Pull underwear up/down Underwear - Performed by helper: Thread/unthread right underwear leg, Thread/unthread left underwear leg Pants- Performed by patient: Thread/unthread right pants leg, Thread/unthread left pants leg, Pull pants up/down Pants- Performed by helper: Thread/unthread right pants leg, Thread/unthread left pants leg   Non-skid slipper socks- Performed by helper: Don/doff right sock, Don/doff left sock     Shoes - Performed by patient: Don/doff right shoe, Don/doff left shoe Shoes - Performed by helper: Fasten right, Fasten left       TED Hose - Performed by helper: Don/doff right TED hose, Don/doff left TED hose  Lower body assist  Assist for lower body dressing: Touching or steadying assistance (Pt > 75%)      Toileting Toileting Toileting activity did not occur: No continent bowel/bladder event Toileting steps completed by patient:  Adjust clothing prior to toileting, Adjust clothing after toileting Toileting steps completed by helper: Performs perineal hygiene, Adjust clothing after toileting, Adjust clothing prior to toileting [place]: Grab bar or rail  Toileting assist Assist level: Touching or steadying assistance (Pt.75%)   Transfers Chair/bed transfer   Chair/bed transfer method: Ambulatory Chair/bed transfer assist level: Touching or steadying assistance (Pt > 75%)       Locomotion Ambulation     Max distance: 200 ft Assist level: Touching or steadying assistance (Pt > 75%)   Wheelchair   Type: Manual Max wheelchair distance: 150 ft (BLE) Assist Level: Supervision or verbal cues  Cognition Comprehension Comprehension assist level: Understands basic 50 - 74% of the time/ requires cueing 25 - 49% of the time  Expression Expression assist level: Expresses basic 25 - 49% of the time/requires cueing 50 - 75% of the time. Uses single words/gestures.  Social Interaction Social Interaction assist level: Interacts appropriately 25 - 49% of time - Needs frequent redirection.  Problem Solving Problem solving assist level: Solves basic less than 25% of the time - needs direction nearly all the time or does not effectively solve problems and may need a restraint for safety  Memory Memory assist level: Recognizes or recalls less than 25% of the time/requires cueing greater than 75% of the time   Medical Problem List and Plan: 1. 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 Status post ORIF of multiple facial fractures 12/08/2015  -continue CIR therapies  -improving awareness/attention 2. DVT Prophylaxis/Anticoagulation: Subcutaneous Lovenox.   -dopplers negative 3. Pain Management: will use tramadol and tylenol for pain.  4. Mood: Seroquel 12.5 mg twice a day--titrate if needed  -ritalin-continue at 10mg  as long as not agitated by dose 5.  Neuropsych: This patient is notcapable of making decisions on hisown behalf. 6. Skin/Wound Care: Routine skin checks  -may try to remove retained suture on scalp this week 7. Fluids/Electrolytes/Nutrition: PO reasonable 8.Tracheostomy. Decannulated. Monitor for healing. 9.Dysphagia. Gastrostomy tube 12/03/2015. Currently on a dysphagia #1 honey thick liquid diet.  -would stay with soft diet given facial fractures regardless of how much liquids are advanced 9.Hypertension. Norvasc 10 mg daily, Coreg 12.5 mg twice a day. Monitor with increased mobility 10.Nondisplaced right radial styloid fracture. Nonweightbearing 11. Comminuted distal left radius fracture Status post closed reduction.    -WBAT bilaterally    12..Urinary retention. Flomax 0.8 mg daily.   -occasionally incontinent  -keflex for E Coli UTI---abx completed 13.Constipation. Laxative assistance   LOS (Days) 15 A FACE TO FACE EVALUATION WAS PERFORMED  Issabella Rix T 01/11/2016 9:07 AM

## 2016-01-11 NOTE — Progress Notes (Addendum)
Physical Therapy Session Note  Patient Details  Name: Justin Fox MRN: [id number] Date of Birth: December 15, 1960  Today's Date: 01/11/2016 PT Individual Time: 1500-1600 and 1634-1700 PT Individual Time Calculation (min): 60 min and 26 min    Short Term Goals: Week 2:  PT Short Term Goal 1 (Week 2): Pt will sustainn attention to funcitonal task x 30 seconds with supervision PT Short Term Goal 2 (Week 2): Pt will consistently perform gait up to 25' with mod A PT Short Term Goal 3 (Week 2): Pt will consistently perform functional transfers with min A  Skilled Therapeutic Interventions/Progress Updates:    Treatment 1: Pt received in bed & agreeable to tx. Pt donned tennis shoes with max cuing to initiate task. Pt ambulated bed>bathroom with min assist overall, transferred sit<>stand on elevated toilet with min assist, (+) void. Therapist noted pt's urine to have strong pungent odor & RN made aware.  Pt required max cuing to attend to hand hygiene from w/c level. Throughout session pt propelled w/c on unit with BLE & supervision overall. Gait training around nurses station with min assist due to inconsistent gait pattern. Pt able to write name & birthday on dry erase board without assistance for recall. Pt unable to recall current month throughout session, as he perseverated on July even with max cuing for October. Pt placed pins on pin tree with BUE and max cuing for pattern/instructions to focus on BUE strengthening & fine motor control as pt had earlier had difficulty removing cap from dry erase marker. Pt able to follow 2 step commands throughout task, occasionally successfully following 3 step commands. Pt requested something to drink and required max cuing to take small sips of honey thick liquid with poor demo by pt. During session therapist passively oriented pt to [facility]; pt unable to recall therapist name even with max cuing throughout session. Pt negotiated 12 steps (6" + 3") with  B rails and mod assist for balance with max cuing for proper foot placement on steps. At end of session pt left in bed with alarm set & all needs within reach.  During session pt stated "my thinker's not working right".   Treatment 2: Pt received in bed & agreeable to tx; pt able to recall that therapist had stated she would return later in the day. Pt transferred to sitting EOB and donned tennis shoes with max cuing to attend to task and complete it himself. Pt ambulated room>gym without AD, min assist overall for balance as pt with more normalized gait pattern but still demonstrates inconsistent step length/width bilaterally. Sitting on edge of mat pt matched cards to on back of mirror. Pt able to sustain attention to task 2 minutes + 2 minutes with max cuing at times. Pt transferred to supine on mat table multiple times between task with max encouragement to return to sitting & participate. Pt ambulated back to room in same manner as noted above. Pt removed shoes & scooted to [facility] with cuing. At end of session pt left in bed with alarm set, all needs within reach & NT present.    Pt becoming easily frustrated with cognitive tasks on this date.  Pt able to utilize sign in hallway with max cuing to find path to room (therapist provided max cuing for room number).   Therapy Documentation Precautions:  Precautions Precautions: Fall Restrictions Weight Bearing Restrictions: Yes RUE Weight Bearing: Weight bearing as tolerated LUE Weight Bearing: Weight bearing as tolerated Other Position/Activity Restrictions:  pt. has cast on left forearm, splint on right forearm - can remove Right splint   See Function Navigator for Current Functional Status.   Therapy/Group: Individual Therapy  [name] 01/11/2016, 5:14 PM

## 2016-01-12 ENCOUNTER — Inpatient Hospital Stay (HOSPITAL_COMMUNITY): Payer: 59 | Admitting: Occupational Therapy

## 2016-01-12 ENCOUNTER — Inpatient Hospital Stay (HOSPITAL_COMMUNITY): Payer: 59

## 2016-01-12 ENCOUNTER — Inpatient Hospital Stay (HOSPITAL_COMMUNITY): Payer: 59 | Admitting: Speech Pathology

## 2016-01-12 ENCOUNTER — Inpatient Hospital Stay (HOSPITAL_COMMUNITY): Payer: 59 | Admitting: Physical Therapy

## 2016-01-12 NOTE — Progress Notes (Signed)
Speech Language Pathology Weekly Progress and Session Note  Patient Details  Name: JAXTYN LINVILLE MRN: [id number] Date of Birth: [birth year]  Beginning of progress report period: January 04, 2016 End of progress report period: January 12, 2016  Today's Date: 01/12/2016 SLP Individual Time: 1400-1500 SLP Individual Time Calculation (min): 60 min   Short Term Goals: Week 2: SLP Short Term Goal 1 (Week 2): Patient will orient to place and situation with Max A question and visual cues.  SLP Short Term Goal 1 - Progress (Week 2): Met SLP Short Term Goal 2 (Week 2): Patient will demonstrate sustained attention to functional tasks for ~1 minute with Max A verbal cues for redirection.  SLP Short Term Goal 2 - Progress (Week 2): Met SLP Short Term Goal 3 (Week 2): Patient will identify 1 cognitive and 1 physical deficit with Max A multimodal cues. SLP Short Term Goal 3 - Progress (Week 2): Met SLP Short Term Goal 4 (Week 2): Patient will follow 1 step commands in 75% of opportunities with Min A verbal cues.  SLP Short Term Goal 4 - Progress (Week 2): Met SLP Short Term Goal 5 (Week 2): Patient will utilize multiple swallows during trials of nectar-thick liquids with minimal overt s/s of aspiration and Mod A verbal cues.  SLP Short Term Goal 5 - Progress (Week 2): Met SLP Short Term Goal 6 (Week 2): Patient will consume current diet with minimal overt s/s of aspiration with Mod A verbal cues for use of swallowing compensatory strategies.  SLP Short Term Goal 6 - Progress (Week 2): Met    New Short Term Goals: Week 3: SLP Short Term Goal 1 (Week 3): Patient will consume current diet with minimal overt s/s of aspiration with Min A verbal cues for use of swallowing compensatory strategies.  SLP Short Term Goal 2 (Week 3): Patient will utilize multiple swallows during trials of nectar-thick liquids with minimal overt s/s of aspiration and Min A verbal cues.  SLP Short Term Goal 3 (Week 3): Patient  will identify 1 cognitive and 1 physical deficit with Mod A multimodal cues. SLP Short Term Goal 4 (Week 3): Patient will demonstrate sustained attention to functional tasks for ~5 minutes with Max A verbal cues for redirection.  SLP Short Term Goal 5 (Week 3): Patient will orient to place and situation with Mod A question and visual cues.   Weekly Progress Updates: Patient has made functional gains and has met 6 of 6 STG's this reporting period. Currently, patient is consuming Dys. 1 textures with honey thick liquids with minimal overt s/s of aspiration and requires Mod A verbal cues for use of swallowing compensatory strategies. Patient is also consuming trials of nectar-thick liquids via tsp with minimal overt s/s of aspiration, therefore, recommend repeat MBS later this week to assess for possible diet upgrade. Currently, patient is also demonstrating behaviors consistent with a Rancho Level VI and requires overall Mod-Max A verbal cues to complete functional and familiar tasks safely in regards to attention, problem solving, recall and awareness. Patient also demonstrates intermittent  impulsivity with functional tasks. Patient and family education is ongoing. Patient would benefit from continued skilled SLP intervention to maximize his swallowing and cognitive function and overall functional independence prior to discharge.    Intensity: Minumum of 1-2 x/day, 30 to 90 minutes Frequency: 3 to 5 out of 7 days Duration/Length of Stay: 01/25/16 Treatment/Interventions: Cognitive remediation/compensation;Cueing hierarchy;Functional tasks;Patient/family education;Dysphagia/aspiration precaution training;Internal/external aids;Speech/Language facilitation;Environmental controls;Therapeutic Activities   Daily Session  Skilled Therapeutic Interventions: Skilled treatment session focused on dysphagia and cognitive goals. SLP facilitated session by providing Mod A verbal cues for use of a teaspoon and  multiple swallows with trials of nectar-thick liquids. Patient consumed trials without overt s/s of aspiration. Recommend repeat MBS at end of this week to assess for possible upgrade. Patient participated in a functional conversation that focused on biographical information with Max A verbal cues for recall and Min A verbal cues for attention to conversation for ~5 minutes. Patient also participated in a verbal description task with Mod A question cues for verbal reasoning and Max A for recall to rules of task. Patient asked to use the bathroom X 1 throughout session and was continent of bladder. Overall, patient with increased sustained attention and appropriate social engagement this session. Patient left supine in bed with all needs within reach. Continue with current plan of care.      Function:   Eating Eating   Modified Consistency Diet: Yes Eating Assist Level: Supervision or verbal cues;Help managing cup/glass   Eating Set Up Assist For: Opening containers Helper Scoops Food on Utensil: Occasionally Helper Brings Food to Mouth: Occasionally   Cognition Comprehension Comprehension assist level: Understands basic 50 - 74% of the time/ requires cueing 25 - 49% of the time  Expression   Expression assist level: Expresses basic 50 - 74% of the time/requires cueing 25 - 49% of the time. Needs to repeat parts of sentences.  Social Interaction Social Interaction assist level: Interacts appropriately 50 - 74% of the time - May be physically or verbally inappropriate.  Problem Solving Problem solving assist level: Solves basic less than 25% of the time - needs direction nearly all the time or does not effectively solve problems and may need a restraint for safety  Memory Memory assist level: Recognizes or recalls less than 25% of the time/requires cueing greater than 75% of the time   Pain No/Denies Pain   Therapy/Group: Individual Therapy  Onix Jumper 01/12/2016, 3:37  PM

## 2016-01-12 NOTE — Progress Notes (Signed)
Nutrition Follow-up  DOCUMENTATION CODES:   Non-severe (moderate) malnutrition in context of chronic illness  INTERVENTION:  Continue Magic cup TID with meals, each supplement provides 290 kcal and 9 grams of protein.  Continue Ensure Enlive BID, each supplement provides 350 kcal and 20 grams of protein.  Encourage adequate PO intake.    NUTRITION DIAGNOSIS:   Malnutrition related to dysphagia, catabolic illness, poor appetite as evidenced by percent weight loss, moderate depletions of muscle mass, moderate depletion of body fat; ongoing  GOAL:   Patient will meet greater than or equal to 90% of their needs; met  MONITOR:   PO intake, TF tolerance, Skin, I & O's, Labs  REASON FOR ASSESSMENT:   Consult Enteral/tube feeding initiation and management (and Calorie Count)  ASSESSMENT:   55 year old right handed male with history of hypertension and substance abuse. Admitted to [facility] 11/23/2015 after a fall 30 feet from scaffolding question loss of consciousness. Cranial CT scan and imaging demonstrated multiple skull base, frontal sinus, and orbital fractures on the right. He also had a right frontal contusion and associated subdural without midline shift. Complex closure right scalp laceration and left scalp incision. Hospital course tracheostomy and gastrostomy tube placement 12/03/2015. Decannulated 12/21/2015. Underwent ORIF 12/08/2015 for multiple facial fractures with reconstruction. Hospital course pain management. His diet has been advanced to a dysphagia #1 honey thick liquid.   Meal completion has been varied from 10-100% with 100% intake today at breakfast and lunch. Pt currently has Ensure ordered and pt has been taking them. RD to continue with current orders.   Diet Order:  DIET - DYS 1 Room service appropriate? Yes; Fluid consistency: Honey Thick  Skin:  Reviewed, no issues  Last BM:  10/11  Height:   Ht Readings from Last 1 Encounters:  12/27/15 6' 1"  (1.854 m)    Weight:   Wt Readings from Last 1 Encounters:  01/12/16 167 lb 9.6 oz (76 kg)    Ideal Body Weight:  83.6 kg  BMI:  Body mass index is 22.11 kg/m.  Estimated Nutritional Needs:   Kcal:  7989-2119  Protein:  110-125 grams  Fluid:  2.3-2.5 L/day  EDUCATION NEEDS:   No education needs identified at this time  Corrin Parker, MS, RD, LDN Pager # [phone] After hours/ weekend pager # [phone]

## 2016-01-12 NOTE — Progress Notes (Signed)
Physical Therapy Session Note  Patient Details  Name: Justin Fox MRN: [id number] Date of Birth: [birth year]  Today's Date: 01/12/2016 PT Individual Time: 1000-1110 PT Individual Time Calculation (min): 70 min    Short Term Goals: Week 3:  PT Short Term Goal 1 (Week 3): Pt will ambulate 50 ft with supervision assist. PT Short Term Goal 2 (Week 3): Pt will negotiate 12 steps with min assist & B rails. PT Short Term Goal 3 (Week 3): Pt will negotiate single step without rails & mod assist. PT Short Term Goal 4 (Week 3): Pt will sustain attention to functional task for 30 seconds with minimal cuing.   Skilled Therapeutic Interventions/Progress Updates:   Pt presented in bed agreeable with encouragement and dgt present. Pt required consistently mod cues for initiating task and completion of task throughout session. Pt ambulated 126ft with HHA per pt request, pt ambulated 64ft with CGA with initial stagger laterally however recovered with no LOB.  During rest periods after gait pt instructed to recall month which pt stating July, re-orientated to current month. Pt performed sorting activities in sitting and standing.  Able to maintain task for 30 sec to 1 minute max with sorting activities. During session pt request to use BR, pt able to use toilet with single step cues. Pt attempted in dual task activity ball toss and counting, pt successful to 10 count. Pt becoming increasingly fatigued, with some frustration noted. Pt returned to room via gait with HHA and returned to bed with dgt present in room.   Therapy Documentation Precautions:  Precautions Precautions: Fall Restrictions Weight Bearing Restrictions: Yes RUE Weight Bearing: Weight bearing as tolerated LUE Weight Bearing: Weight bearing as tolerated Other Position/Activity Restrictions: pt. has cast on left forearm, splint on right forearm - can remove Right splint General: PT Amount of Missed Time (min): 5 Minutes PT Missed  Treatment Reason: Patient fatigue Vital Signs: Therapy Vitals Temp: 97.9 F (36.6 C) Temp Source: Oral Pulse Rate: 76 Resp: 18 BP: 106/71 Patient Position (if appropriate): Lying Oxygen Therapy SpO2: 100 % O2 Device: Not Delivered Pain:   Mobility:   Locomotion :    Trunk/Postural Assessment :    Balance:   Exercises:   Other Treatments:     See Function Navigator for Current Functional Status.   Therapy/Group: Individual Therapy  Jessicamarie Amiri 01/12/2016, 3:56 PM

## 2016-01-12 NOTE — Progress Notes (Signed)
Occupational Therapy Session Note  Patient Details  Name: Justin Fox MRN: [id number] Date of Birth: 26-Apr-1960  Today's Date: 01/12/2016 OT Individual Time: 0800-0900 OT Individual Time Calculation (min): 60 min     Short Term Goals: Week 2:  OT Short Term Goal 1 (Week 2): Pt will perform toilet transfers with mod A OT Short Term Goal 2 (Week 2): Pt will maintain sustained attention for 2 minutes during functional task OT Short Term Goal 3 (Week 2): Pt will perform grooming tasks (wash face, brush teetn, and shave) with min A OT Short Term Goal 4 (Week 2): Pt will perform bathing tasks with min A OT Short Term Goal 5 (Week 2): Pt will perform LB dressing tasks with min A  Skilled Therapeutic Interventions/Progress Updates:    Pt propelling w/c with wife upon arrival.  Pt engaged in variety of activities with focus on sustained attention, problem solving, sequencing, and activity tolerance.  Pt required min verbal cues to complete two PVC pipe structures.  Pt requested use of toilet and amb with HHA to bathroom and performed all toileting tasks with steady A.  Pt engaged in bean bag toss and retrieving items from floor.  Pt also engaged in NuStep with focus on attaining one minute sustained activity without rest breaks.  Pt able to maintain activity for approx 30 seconds and required max verbal cues to redirect to task.  Pt returned to room and remained in bed with bed alarm activated.  Therapy Documentation Precautions:  Precautions Precautions: Fall Restrictions Weight Bearing Restrictions: Yes RUE Weight Bearing: Weight bearing as tolerated LUE Weight Bearing: Weight bearing as tolerated Other Position/Activity Restrictions: pt. has cast on left forearm, splint on right forearm - can remove Right splint   Pain: Pt denied pain  See Function Navigator for Current Functional Status.   Therapy/Group: Individual Therapy  Si, Snider 01/12/2016, 11:03 AM

## 2016-01-12 NOTE — Progress Notes (Signed)
Occupational Therapy Note  Patient Details  Name: Justin Fox MRN: [id number] Date of Birth: 1960/06/16  Today's Date: 01/12/2016 OT Individual Time: 1300-1400 OT Individual Time Calculation (min): 60 min    Pt denied pain Individual therapy  Pt resting in bed upon arrival with daughter present.  Pt required max encouragement to participate in therapy for engaged in all activities throughout session.  Pt engaged in variety of activities in therapy gym and day room including Zoom Zoom standing on compliant and noncompliant surface, tossing horseshoes and retrieving them from the floor, and bean bag toss.  Pt amb with HHA (steady A) throughout unit.  Pt required multiple rest breaks during session with continuous encouragement from therapist and daughter.  Pt requested use of toilet and required assistance with hygiene.  Pt maintained sustained attention for approx 2 mins while engaged in activities.  Pt remained in w/c in care of SLP at end of session.   [name], [place]den [facility] 01/12/2016, 2:35 PM

## 2016-01-12 NOTE — Progress Notes (Signed)
[place] PHYSICAL MEDICINE & REHABILITATION     PROGRESS NOTE    Subjective/Complaints: Sitting in bed. Almost apologetic about being "difficult" with therapist/staff at times. "the way they do it is different but it works"  ROS still somewhat limited due to mental status  Objective: Vital Signs: Blood pressure 133/76, pulse 70, temperature 97.6 F (36.4 C), temperature source Oral, resp. rate 18, weight 76 kg (167 lb 9.6 oz), SpO2 98 %. No results found. No results for input(s): WBC, HGB, HCT, PLT in the last 72 hours. No results for input(s): NA, K, CL, GLUCOSE, BUN, CREATININE, CALCIUM in the last 72 hours.  Invalid input(s): CO CBG (last 3)  No results for input(s): GLUCAP in the last 72 hours.  Wt Readings from Last 3 Encounters:  01/12/16 76 kg (167 lb 9.6 oz)  11/23/15 80 kg (176 lb 5.9 oz)  10/12/15 86.2 kg (190 lb)    Physical Exam:  Constitutional: He appears well-developed. NAD. Eyes: Right eye ptosis.  Conj are normal.  Neck: Normal range of motion. Neck supple.  Trach site healed Cardiovascular: Normal rateand regular rhythm.  Respiratory: Effort normaland breath sounds normal. No respiratory distress.  GI: Soft. Bowel sounds are normal. He exhibits no distension.  PEG tube in place Neurological: He is awake and fairly alert   Oriented to self only, sometimes hospital. Perseverative at times. more redirectable. Memory improving. Moves all 4's  Sensation appears to be intact to light touch throughout. DTRs 3+ LLE. Musc: no wrist/forearm pain Skin:  2 retained sutures on scalp incision--in place  Psych: Unable to assess to due cognition  Assessment/Plan: 1. 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 Physiatrist is providing close team supervision and 24 hour management of active medical problems listed  below. Physiatrist and rehab team continue to assess barriers to discharge/monitor patient progress toward functional and medical goals.  Function:  Bathing Bathing position Bathing activity did not occur: Refused Position: Wheelchair/chair at sink  Bathing parts Body parts bathed by patient: Right arm, Left arm, Chest, Abdomen, Front perineal area, Right upper leg, Left upper leg, Right lower leg, Left lower leg Body parts bathed by helper: Buttocks, Back  Bathing assist Assist Level: Supervision or verbal cues      Upper Body Dressing/Undressing Upper body dressing   What is the patient wearing?: Pull over shirt/dress     Pull over shirt/dress - Perfomed by patient: Thread/unthread right sleeve, Thread/unthread left sleeve, Put head through opening, Pull shirt over trunk Pull over shirt/dress - Perfomed by helper: Pull shirt over trunk        Upper body assist Assist Level: Supervision or verbal cues   Set up : To obtain clothing/put away  Lower Body Dressing/Undressing Lower body dressing   What is the patient wearing?: Underwear, Pants, [employer], Shoes Underwear - Performed by patient: Pull underwear up/down Underwear - Performed by helper: Thread/unthread right underwear leg, Thread/unthread left underwear leg Pants- Performed by patient: Thread/unthread right pants leg, Thread/unthread left pants leg, Pull pants up/down Pants- Performed by helper: Thread/unthread right pants leg, Thread/unthread left pants leg   Non-skid slipper socks- Performed by helper: Don/doff right sock, Don/doff left sock     Shoes - Performed by patient: Don/doff right shoe, Don/doff left shoe Shoes - Performed by helper: Fasten right, Fasten left       TED Hose - Performed by helper: Don/doff  right TED hose, Don/doff left TED hose  Lower body assist Assist for lower body dressing: Touching or steadying assistance (Pt > 75%)      Toileting Toileting Toileting activity did not occur: No  continent bowel/bladder event Toileting steps completed by patient: Adjust clothing prior to toileting, Adjust clothing after toileting Toileting steps completed by helper: Performs perineal hygiene, Adjust clothing prior to toileting Toileting Assistive Devices: Grab bar or rail  Toileting assist Assist level: Touching or steadying assistance (Pt.75%)   Transfers Chair/bed transfer   Chair/bed transfer method: Ambulatory Chair/bed transfer assist level: Touching or steadying assistance (Pt > 75%)       Locomotion Ambulation     Max distance: 150 ft Assist level: Touching or steadying assistance (Pt > 75%)   Wheelchair   Type: Manual Max wheelchair distance: 150 ft (BLE) Assist Level: Supervision or verbal cues  Cognition Comprehension Comprehension assist level: Understands basic 50 - 74% of the time/ requires cueing 25 - 49% of the time  Expression Expression assist level: Expresses basic 25 - 49% of the time/requires cueing 50 - 75% of the time. Uses single words/gestures.  Social Interaction Social Interaction assist level: Interacts appropriately less than 25% of the time. May be withdrawn or combative.  Problem Solving Problem solving assist level: Solves basic less than 25% of the time - needs direction nearly all the time or does not effectively solve problems and may need a restraint for safety  Memory Memory assist level: Recognizes or recalls less than 25% of the time/requires cueing greater than 75% of the time   Medical Problem List and Plan: 1. 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 Status post ORIF of multiple facial fractures 12/08/2015  -continue CIR therapies  -improving awareness/attention---I challenged him to be more compliant with staff in order to show [place] he's closer to going home 2. DVT Prophylaxis/Anticoagulation: Subcutaneous Lovenox.   -dopplers negative 3. Pain Management: will use tramadol and  tylenol for pain.  4. Mood: Seroquel 12.5 mg twice a day--titrate if needed  -ritalin-continue at 10mg  as long as not agitated by dose 5. Neuropsych: This patient is notcapable of making decisions on hisown behalf. 6. Skin/Wound Care: Routine skin checks  -may try to remove retained suture on scalp this week 7. Fluids/Electrolytes/Nutrition: PO reasonable 8.Tracheostomy. Decannulated. Monitor for healing. 9.Dysphagia. Gastrostomy tube 12/03/2015. Currently on a dysphagia #1 honey thick liquid diet.  -would stay with soft diet given facial fractures regardless of how much liquids are advanced 9.Hypertension. Norvasc 10 mg daily, Coreg 12.5 mg twice a day. Monitor with increased mobility 10.Nondisplaced right radial styloid fracture. Nonweightbearing 11. Comminuted distal left radius fracture Status post closed reduction.    -WBAT bilaterally    12..Urinary retention. Flomax 0.8 mg daily.   -still incontinent  -keflex for E Coli UTI---abx completed   -urine malodorous again---recheck ucx 13.Constipation. Laxative assistance   LOS (Days) 16 A FACE TO FACE EVALUATION WAS PERFORMED  SWARTZ,ZACHARY T 01/12/2016 8:35 AM

## 2016-01-13 ENCOUNTER — Inpatient Hospital Stay (HOSPITAL_COMMUNITY): Payer: 59 | Admitting: Speech Pathology

## 2016-01-13 ENCOUNTER — Inpatient Hospital Stay (HOSPITAL_COMMUNITY): Payer: 59

## 2016-01-13 ENCOUNTER — Inpatient Hospital Stay (HOSPITAL_COMMUNITY): Payer: 59 | Admitting: Physical Therapy

## 2016-01-13 ENCOUNTER — Inpatient Hospital Stay (HOSPITAL_COMMUNITY): Payer: 59 | Admitting: Occupational Therapy

## 2016-01-13 NOTE — Progress Notes (Signed)
Social Work Patient ID: Justin Fox, male   DOB: 06/29/1960, 55 y.o.   MRN: [id number]   CSW met with pt and called his wife to update them on team conference discussion.  CSW later talked with pt's dtr to update her.  Everyone is in agreement with extending his stay for one more week.  CSW also talked to dtr about HH to start and then they can transition pt to outpt rehab.  She felt good about that and will share that with pt's wife.  CSW will continue to follow and assist as needed.

## 2016-01-13 NOTE — Progress Notes (Signed)
Speech Language Pathology Daily Session Note  Patient Details  Name: JEFFREN MAXTED MRN: [id number] Date of Birth: 1960/06/23  Today's Date: 01/13/2016 SLP Individual Time: 1305-1400 SLP Individual Time Calculation (min): 55 min   Short Term Goals: Week 3: SLP Short Term Goal 1 (Week 3): Patient will consume current diet with minimal overt s/s of aspiration with Min A verbal cues for use of swallowing compensatory strategies.  SLP Short Term Goal 2 (Week 3): Patient will utilize multiple swallows during trials of nectar-thick liquids with minimal overt s/s of aspiration and Min A verbal cues.  SLP Short Term Goal 3 (Week 3): Patient will identify 1 cognitive and 1 physical deficit with Mod A multimodal cues. SLP Short Term Goal 4 (Week 3): Patient will demonstrate sustained attention to functional tasks for ~5 minutes with Max A verbal cues for redirection.  SLP Short Term Goal 5 (Week 3): Patient will orient to place and situation with Mod A question and visual cues.   Skilled Therapeutic Interventions: Skilled treatment session focused on cognitive and dysphagia goals. Upon arrival, patient was awake while supine in bed. SLP facilitated session by providing Max encouragement and tactile cues for patient to sit EOB and transfer to the wheelchair.  Patient particiapted in a basic money management task with Mod A verbal and visual cues for problem solving and recall. Function impacted by impulsivity and decreased sustained attention to task. Patient consumed lunch meal of Dys. 1 textures with honey-thick liquids without overt s/s of aspiration but required Mod A verbal cues for use of small bites/sips and multiple swallows. Patient required total A for orientation to situation when given choices from a field of 2. Patient requested to use the bathroom and was able to successfully void. Patient handed off to OT. Continue with current plan of care.   Function:  Eating Eating   Modified  Consistency Diet: Yes Eating Assist Level: Supervision or verbal cues;More than reasonable amount of time;Set up assist for   Eating Set Up Assist For: Opening containers       Cognition Comprehension Comprehension assist level: Understands basic 50 - 74% of the time/ requires cueing 25 - 49% of the time  Expression   Expression assist level: Expresses basic 50 - 74% of the time/requires cueing 25 - 49% of the time. Needs to repeat parts of sentences.  Social Interaction Social Interaction assist level: Interacts appropriately 50 - 74% of the time - May be physically or verbally inappropriate.  Problem Solving Problem solving assist level: Solves basic less than 25% of the time - needs direction nearly all the time or does not effectively solve problems and may need a restraint for safety  Memory Memory assist level: Recognizes or recalls less than 25% of the time/requires cueing greater than 75% of the time    Pain No/Denies Pain   Therapy/Group: Individual Therapy  Diyari Cherne, [place] 01/13/2016, 3:13 PM

## 2016-01-13 NOTE — Progress Notes (Signed)
Physical Therapy Note  Patient Details  Name: KHAIR VIESCA MRN: [id number] Date of Birth: Jul 01, 1960 Today's Date: 01/13/2016    Time: [phone] 58 minutes  1:1 No c/o pain. Pt c/o fatigue throughout session but able to participate with encouragement. Gait throughout unit much improved, min A for slight ataxia and LOB when distracted.  Standing balance with bean bag toss, ball toss and ball kick all with min A for balance. Pt able to stay standing when told to count 10-12 ball tosses/kicks. Pt will perseverate on sitting unless given a specific goal.  Pt performed nu step 5 x 1 minute with pt able to propel at max 30 seconds before requiring cues to continue and attend to task.  Clothespin grip activity with pt able to grip and release black pins with bilat UEs.  Pt asked to write name and address, pt writes that he lives in [place], [place] where he has never lived. When questioned pt states that he lives in [place] (pt currently lives in [place]), difficulty reorienting pt.   Bannie Lobban 01/13/2016, 10:28 AM

## 2016-01-13 NOTE — Progress Notes (Signed)
Occupational Therapy Note  Patient Details  Name: Justin Fox MRN: [id number] Date of Birth: 1960-09-18  Today's Date: 01/13/2016 OT Individual Time: 1100-1200 OT Individual Time Calculation (min): 60 min    Pt denied pain but c/o soreness from previous day's therapy Individual therapy  Pt resting in w/c upon arrival with daughter present.  Pt initially engaged in w/c mobility task searching for numbered discs (1-12) sequentially.  Pt required max verbal cues to continue to engage in task but was able to locate all discs without verbal cues.  Pt transitioned to ADL apartment and participated in placing cover on bed correctly.  Pt required max verbal cues to engage and finally agreed when a "bargain" was reached.  Pt was able to lay back in bed for 2 mins after completing task.  Pt propelled w/c to room requiring max questioning cues to remember room number.  Pt engaged in shaving task with safety razor.  Pt completed task with min A.  Pt was able to initiate and complete 90% of task without verbal cues.  Pt returned to bed with daughter present.  All needs within reach and bed alarm activated.   [place], [place] [facility] 01/13/2016, 12:05 PM
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
## 2016-01-13 NOTE — Progress Notes (Signed)
Occupational Therapy Session Note  Patient Details  Name: Justin Fox MRN: [id number] Date of Birth: 01-28-1961  Today's Date: 01/13/2016 OT Individual Time: [id number] OT Individual Time Calculation (min): 45 min  and Today's Date: 01/13/2016 OT Missed Time:   Missed Time Reason:       Short Term Goals: Week 2:  OT Short Term Goal 1 (Week 2): Pt will perform toilet transfers with mod A OT Short Term Goal 2 (Week 2): Pt will maintain sustained attention for 2 minutes during functional task OT Short Term Goal 3 (Week 2): Pt will perform grooming tasks (wash face, brush teetn, and shave) with min A OT Short Term Goal 4 (Week 2): Pt will perform bathing tasks with min A OT Short Term Goal 5 (Week 2): Pt will perform LB dressing tasks with min A  Skilled Therapeutic Interventions/Progress Updates:    Pt engaged in variety of therapeutic tasks with focus on active participation, activity tolerance, standing balance, attention to task, functional ambulation without AD, task initiation, and cognitive remediation.  Pt continues to require max verbal cues to actively participate in tasks with max verbal cues to attend to task.  Pt sustains attention for approx 15 seconds today without redirection.  Pt requested to use toilet and propelled w/c back to room and transferred to toilet with min A.  Pt completed all toileting tasks with steady A. Pt requires steady A/min A when ambulating without AD to complete functional tasks.  Pt returned to bed at end of session and remained in bed with all needs within reach and bed alarm activated.  Therapy Documentation Precautions:  Precautions Precautions: Fall Restrictions Weight Bearing Restrictions: Yes RUE Weight Bearing: Weight bearing as tolerated LUE Weight Bearing: Weight bearing as tolerated Other Position/Activity Restrictions: pt. has cast on left forearm, splint on right forearm - can remove Right splint General:   Vital Signs:   Pain:    ADL:   Exercises:   Other Treatments:    See Function Navigator for Current Functional Status.   Therapy/Group: Individual Therapy  [name], [name] 01/13/2016, 9:02 AM
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
## 2016-01-13 NOTE — Progress Notes (Signed)
Occupational Therapy Session Note  Patient Details  Name: Justin Fox MRN: [id number] Date of Birth: 1960-05-27  Today's Date: 01/13/2016 OT Individual Time: 1400-1430 OT Individual Time Calculation (min): 30 min     Short Term Goals:Week 2:  OT Short Term Goal 1 (Week 2): Pt will perform toilet transfers with mod A OT Short Term Goal 2 (Week 2): Pt will maintain sustained attention for 2 minutes during functional task OT Short Term Goal 3 (Week 2): Pt will perform grooming tasks (wash face, brush teetn, and shave) with min A OT Short Term Goal 4 (Week 2): Pt will perform bathing tasks with min A OT Short Term Goal 5 (Week 2): Pt will perform LB dressing tasks with min A  Skilled Therapeutic Interventions/Progress Updates:    Pt seen for OT session addressing cognitive goals and ADL re-training. Pt received in hallway in w/c with hand off from SLP. Pt followed 1 step basic directions to locate BI gym. In gym, pt completed sequencing tasks of days of the week and months of the year. Completed days of week with 100% accuracy independently. He completed sequenced 11/12 months, requiring mod-max questioning cues to self correct.  He required total A to recall room number and significantly increased time and mod cues to locate room. He ambulated with HHA into bathroom and completed toileting task with steadying assist and VCs for sequencing as well as safety awareness. Hand hygiene completed standing at sink, VCs for maintaining attention to task. He returned to bed and completed bed mobility supervision- mod I. He was oriented to place, however, required max cuing for time and situation.  Pt left in supine at end of session, all needs in reach, re-educated regarding use of call bell and bed alarm on.   Therapy Documentation Precautions:  Precautions Precautions: Fall Restrictions Weight Bearing Restrictions: Yes RUE Weight Bearing: Weight bearing as tolerated LUE Weight Bearing: Weight  bearing as tolerated Other Position/Activity Restrictions: pt. has cast on left forearm, splint on right forearm - can remove Right splint  See Function Navigator for Current Functional Status.   Therapy/Group: Individual Therapy  Lewis, Shawnita Krizek C 01/13/2016, 6:52 AM

## 2016-01-13 NOTE — Progress Notes (Signed)
Occupational Therapy Weekly Progress Note  Patient Details  Name: Justin Fox MRN: [id number] Date of Birth: [birth year]  Beginning of progress report period: January 06, 2016 End of progress report period: January 13, 2016  Patient has met 3 of 5 short term goals.  Pt made steady progress during the past week.  Pt continues to require max encouragement to actively participate in therapy session.  Pt attends to tasks 15-30 seconds before requiring verbal cues for redirection.  Pt requires max verbal cues to actively participate in bathing tasks with mod A.  Pt requires max verbal cues for orientation (date and place). Pt exhibits behaviors consistent with [place] Level VI.  Patient continues to demonstrate the following deficits: ataxia, cognitive deficits, muscle weakness (generalized) and coordination disorder and therefore will continue to benefit from skilled OT intervention to enhance overall performance with BADL and Reduce care partner burden.  Patient progressing toward long term goals..  Continue plan of care. Pt's d/c date has been extended by one week in order to cont to make functional gains and addressing physical/ cognitive deficits.   OT Short Term Goals Week 2:  OT Short Term Goal 1 (Week 2): Pt will perform toilet transfers with mod A OT Short Term Goal 1 - Progress (Week 2): Met OT Short Term Goal 2 (Week 2): Pt will maintain sustained attention for 2 minutes during functional task OT Short Term Goal 2 - Progress (Week 2): Progressing toward goal OT Short Term Goal 3 (Week 2): Pt will perform grooming tasks (wash face, brush teetn, and shave) with min A OT Short Term Goal 3 - Progress (Week 2): Met OT Short Term Goal 4 (Week 2): Pt will perform bathing tasks with min A OT Short Term Goal 4 - Progress (Week 2): Progressing toward goal OT Short Term Goal 5 (Week 2): Pt will perform LB dressing tasks with min A OT Short Term Goal 5 - Progress (Week 2): Met Week 3:  OT Short  Term Goal 1 (Week 3): Pt will maintain sustained atteniton for 2 mins during functional tasks with min verbal cues to redirect OT Short Term Goal 2 (Week 3): Pt will perform bathing tasks with min A OT Short Term Goal 3 (Week 3): Pt will perform toileting tasks at supervision level OT Short Term Goal 4 (Week 3): Pt will initiate and complete UB and LB dressing tasks with supervision when presented with clothing   Therapy Documentation Precautions:  Precautions Precautions: Fall Restrictions Weight Bearing Restrictions: Yes RUE Weight Bearing: Weight bearing as tolerated LUE Weight Bearing: Weight bearing as tolerated     See Function Navigator for Current Functional Status.    Estefan, Pattison [facility] 01/13/2016, 12:17 PM

## 2016-01-14 ENCOUNTER — Inpatient Hospital Stay (HOSPITAL_COMMUNITY): Payer: 59

## 2016-01-14 ENCOUNTER — Inpatient Hospital Stay (HOSPITAL_COMMUNITY): Payer: 59 | Admitting: Physical Therapy

## 2016-01-14 ENCOUNTER — Inpatient Hospital Stay (HOSPITAL_COMMUNITY): Payer: 59 | Admitting: Speech Pathology

## 2016-01-14 LAB — URINE CULTURE

## 2016-01-14 NOTE — Progress Notes (Signed)
Occupational Therapy Note  Patient Details  Name: Justin Fox MRN: [id number] Date of Birth: 1961-03-02  Today's Date: 01/14/2016 OT Individual Time: 1400-1430 OT Individual Time Calculation (min): 30 min    Pt denied pain Individual therapy  Pt resting in bed upon arrival.  Focus on cognitive remediation including orientation to date, problem solving, awareness, and following two step commands.  Pt engaged in peg board tasks requiring min verbal cues for problem solving of more complex patterns.  Pt required tot A for orientation to day/date.  Pt utilized visual aids for orientation to time, month, and year.  Pt remained in bed with all needs within reach and bed alarm activated.   Lejon, Antonovich [facility] 01/14/2016, 2:45 PM

## 2016-01-14 NOTE — Progress Notes (Signed)
Occupational Therapy Session Note  Patient Details  Name: Justin Fox MRN: [id number] Date of Birth: November 26, 1960  Today's Date: 01/14/2016 OT Individual Time: 1100-1200 OT Individual Time Calculation (min): 60 min     Short Term Goals: Week 3:  OT Short Term Goal 1 (Week 3): Pt will maintain sustained atteniton for 2 mins during functional tasks with min verbal cues to redirect OT Short Term Goal 2 (Week 3): Pt will perform bathing tasks with min A OT Short Term Goal 3 (Week 3): Pt will perform toileting tasks at supervision level OT Short Term Goal 4 (Week 3): Pt will initiate and complete UB and LB dressing tasks with supervision when presented with clothing  Skilled Therapeutic Interventions/Progress Updates:    Pt resting in bed upon arrival with daughter present.  Pt initially engaged in BADL retraining including bathing at shower level and dressing with sit<>stand from w/c.  Pt required max multimodal cues and encouragement to participate in bathing/dressing tasks.  Pt required assistance with shower for thoroughness.  Pt required max verbal cues to use wash cloth/soap to bathe. He was content to just let the water run over him.  Pt required max encouragement to initiate and complete dressing tasks.  Pt requested that his daughter complete tasks.  Pt amb to day room with stop at bathroom for bowel movement.  Pt completed all toileting tasks with steady A for balance.  Pt engaged in peg board activity replicating patterns with colored pegs.  Pt required max verbal cues to continue with tasks and required multiple rest breaks.  Pt agreed that using "his thinker" was difficult and exhausting.  Pt required min verbal cues for problem solving with patterns.  Pt returned to his room and remained in recliner with daughter present.   Therapy Documentation Precautions:  Precautions Precautions: Fall Restrictions Weight Bearing Restrictions: Yes RUE Weight Bearing: Weight bearing as  tolerated LUE Weight Bearing: Weight bearing as tolerated Other Position/Activity Restrictions: pt. has cast on left forearm, splint on right forearm - can remove Right splint   Pain: Pain Assessment Pain Assessment: No/denies pain  See Function Navigator for Current Functional Status.   Therapy/Group: Individual Therapy  [name], [name] 01/14/2016, 12:07 PM

## 2016-01-14 NOTE — Progress Notes (Signed)
Physical Therapy Session Note  Patient Details  Name: Justin Fox MRN: [id number] Date of Birth: 04-28-1960  Today's Date: 01/14/2016 PT Individual Time: 0800-0900 PT Individual Time Calculation (min): 60 min   Skilled Therapeutic Interventions/Progress Updates:    Session focused on addressing functional mobility including transfers, gait, stairs, simulated car transfer, bed mobility, gait over ramp and mulched surface, dynamic balance and attention, memory, and awareness during these tasks. Pt completed cognitive tasks in seated and standing positions to put together pipe tree from picture and place colored clothespins on each bar counting out loud and then writing down the amount of each color on paper with max verbal cues for attention during activity. Pt frequently wanting to lay down requiring redirection and rest breaks as appropriate. Pt at overall min assist for all mobility including gait without AD > 150' with decreased balance noted, decreased safety awareness, and min assist for LOB's when occurred.    Therapy Documentation Precautions:  Precautions Precautions: Fall Restrictions Weight Bearing Restrictions: Yes RUE Weight Bearing: Weight bearing as tolerated LUE Weight Bearing: Weight bearing as tolerated Other Position/Activity Restrictions: pt. has cast on left forearm, splint on right forearm - can remove Right splint  Pain:  Denies pain.   See Function Navigator for Current Functional Status.   Therapy/Group: Individual Therapy  Canary Brim Ivory Broad, PT, DPT  01/14/2016, 9:10 AM

## 2016-01-14 NOTE — Progress Notes (Signed)
MBSS complete. Full report located under chart review in imaging section. Will trial Dys 2 and thins with SLP before diet upgrade.  Weldon Inches MA, CCC-SLP

## 2016-01-14 NOTE — Progress Notes (Signed)
[facility] PHYSICAL MEDICINE & REHABILITATION     PROGRESS NOTE    Subjective/Complaints: No new complaints. Pushing himself around in w/c. Wife with him. Slept well   ROS still somewhat limited due to mental status  Objective: Vital Signs: Blood pressure 102/69, pulse 93, temperature 99.4 F (37.4 C), temperature source Oral, resp. rate 16, weight 76 kg (167 lb 9.6 oz), SpO2 97 %. No results found. No results for input(s): WBC, HGB, HCT, PLT in the last 72 hours. No results for input(s): NA, K, CL, GLUCOSE, BUN, CREATININE, CALCIUM in the last 72 hours.  Invalid input(s): CO CBG (last 3)  No results for input(s): GLUCAP in the last 72 hours.  Wt Readings from Last 3 Encounters:  01/12/16 76 kg (167 lb 9.6 oz)  11/23/15 80 kg (176 lb 5.9 oz)  10/12/15 86.2 kg (190 lb)    Physical Exam:  Constitutional: He appears well-developed. NAD. Eyes: Right eye ptosis.  Conj are normal.  Neck: Normal range of motion. Neck supple.  Cardiovascular: Normal rateand regular rhythm.  Respiratory: Effort normaland breath sounds normal. No respiratory distress.  GI: Soft. Bowel sounds are normal. He exhibits no distension.  PEG tube in place Neurological: He is awake and fairly alert   Oriented to self only, sometimes hospital. Perseverative at times. more redirectable. Memory improving. Moves all 4's  Sensation appears to be intact to light touch throughout. DTRs 3+ LLE. Musc: no wrist/forearm pain Skin:  Edges of absorbable suture exposed above skin Psych: Unable to assess to due cognition  Assessment/Plan: 1. 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 Physiatrist is providing close team supervision and 24 hour management of active medical problems listed below. Physiatrist and rehab team continue to assess barriers to discharge/monitor  patient progress toward functional and medical goals.  Function:  Bathing Bathing position Bathing activity did not occur: Refused Position: Wheelchair/chair at sink  Bathing parts Body parts bathed by patient: Right arm, Left arm, Chest, Abdomen, Front perineal area, Right upper leg, Left upper leg, Right lower leg, Left lower leg Body parts bathed by helper: Buttocks, Back  Bathing assist Assist Level: Supervision or verbal cues      Upper Body Dressing/Undressing Upper body dressing   What is the patient wearing?: Pull over shirt/dress     Pull over shirt/dress - Perfomed by patient: Thread/unthread right sleeve, Thread/unthread left sleeve, Put head through opening, Pull shirt over trunk Pull over shirt/dress - Perfomed by helper: Pull shirt over trunk        Upper body assist Assist Level: Supervision or verbal cues   Set up : To obtain clothing/put away  Lower Body Dressing/Undressing Lower body dressing   What is the patient wearing?: Underwear, Pants, [employer], Shoes Underwear - Performed by patient: Pull underwear up/down Underwear - Performed by helper: Thread/unthread right underwear leg, Thread/unthread left underwear leg Pants- Performed by patient: Thread/unthread right pants leg, Thread/unthread left pants leg, Pull pants up/down Pants- Performed by helper: Thread/unthread right pants leg, Thread/unthread left pants leg   Non-skid slipper socks- Performed by helper: Don/doff right sock, Don/doff left sock     Shoes - Performed by patient: Don/doff right shoe, Don/doff left shoe Shoes - Performed by helper: Fasten right, Fasten left       TED Hose - Performed by helper: Don/doff right TED hose, Don/doff left TED hose  Lower body assist Assist  for lower body dressing: Touching or steadying assistance (Pt > 75%)      Toileting Toileting Toileting activity did not occur: No continent bowel/bladder event Toileting steps completed by patient: Adjust clothing  prior to toileting, Performs perineal hygiene, Adjust clothing after toileting Toileting steps completed by helper: Performs perineal hygiene, Adjust clothing after toileting Toileting Assistive Devices: Grab bar or rail  Toileting assist Assist level: Touching or steadying assistance (Pt.75%)   Transfers Chair/bed transfer   Chair/bed transfer method: Stand pivot, Ambulatory Chair/bed transfer assist level: Touching or steadying assistance (Pt > 75%)       Locomotion Ambulation     Max distance: 166ft Assist level: Touching or steadying assistance (Pt > 75%)   Wheelchair   Type: Manual Max wheelchair distance: 150 ft (BLE) Assist Level: Supervision or verbal cues  Cognition Comprehension Comprehension assist level: Understands basic 50 - 74% of the time/ requires cueing 25 - 49% of the time  Expression Expression assist level: Expresses basic 50 - 74% of the time/requires cueing 25 - 49% of the time. Needs to repeat parts of sentences.  Social Interaction Social Interaction assist level: Interacts appropriately 50 - 74% of the time - May be physically or verbally inappropriate.  Problem Solving Problem solving assist level: Solves basic less than 25% of the time - needs direction nearly all the time or does not effectively solve problems and may need a restraint for safety  Memory Memory assist level: Recognizes or recalls less than 25% of the time/requires cueing greater than 75% of the time   Medical Problem List and Plan: 1. 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 Status post ORIF of multiple facial fractures 12/08/2015  -continue CIR therapies  -wife has arranged OMFS follow up at [facility] after discharge from hospital 2. DVT Prophylaxis/Anticoagulation: Subcutaneous Lovenox.   -dopplers negative 3. Pain Management: will use tramadol and tylenol for pain.  4. Mood: Seroquel 12.5 mg twice a day--continue at current  dosing  -ritalin-continue at 10mg  as long as not agitated by dose 5. Neuropsych: This patient is notcapable of making decisions on hisown behalf. 6. Skin/Wound Care: Routine skin checks  -may try to remove retained suture on scalp this week 7. Fluids/Electrolytes/Nutrition: PO reasonable 8.Tracheostomy. Decannulated. Marland Kitchen 9.Dysphagia. Gastrostomy tube 12/03/2015. Currently on a dysphagia #1 honey thick liquid diet.  -would stay with soft diet given facial fractures regardless of how much liquids are advanced  -PEG out soon? 9.Hypertension. Norvasc 10 mg daily, Coreg 12.5 mg twice a day. Monitor with increased mobility 10.Nondisplaced right radial styloid fracture. Nonweightbearing 11. Comminuted distal left radius fracture Status post closed reduction.    -WBAT bilaterally    12..Urinary retention. Flomax 0.8 mg daily.   -still incontinent  -keflex for E Coli UTI---abx completed   -urine malodorous again---repeat ucx with multispecies 13.Constipation. Laxative assistance   LOS (Days) 18 A FACE TO FACE EVALUATION WAS PERFORMED  Roanna Reaves T 01/14/2016 10:12 AM

## 2016-01-14 NOTE — Progress Notes (Signed)
Physical Therapy Session Note  Patient Details  Name: Justin Fox MRN: [id number] Date of Birth: 14-Aug-1960  Today's Date: 01/14/2016 PT Individual Time: 1433-1530 PT Individual Time Calculation (min): 57 min    Short Term Goals: Week 3:  PT Short Term Goal 1 (Week 3): Pt will ambulate 50 ft with supervision assist. PT Short Term Goal 2 (Week 3): Pt will negotiate 12 steps with min assist & B rails. PT Short Term Goal 3 (Week 3): Pt will negotiate single step without rails & mod assist. PT Short Term Goal 4 (Week 3): Pt will sustain attention to functional task for 30 seconds with minimal cuing.   Skilled Therapeutic Interventions/Progress Updates:    Pt resting in bed on arrival, no c/o pain, and agreeable to therapy.  Session focus on attention to functional tasks, gait training, transfers, stair negotiation, standing tolerance and standing balance.  Pt ambulates throughout unit, max distance 150', with steady assist focus on path finding using external aids with mod cues.  Stair negotiation 2x8 steps with 2 rails and steady assist with min verbal cues for safe ascent/descent.  Pt engaged in table top activity identifying animals and placing in cup focus on standing tolerance, attention, and following directions.  Dynavision mode A continuous for 3 trials focus on sustained attention in quiet environment and standing tolerance max time 73 seconds.  Pt returned to room at end of session and positioned in bed with call bell in reach and needs met.   Therapy Documentation Precautions:  Precautions Precautions: Fall Restrictions Weight Bearing Restrictions: Yes RUE Weight Bearing: Weight bearing as tolerated LUE Weight Bearing: Weight bearing as tolerated Other Position/Activity Restrictions: pt. has cast on left forearm, splint on right forearm - can remove Right splint  See Function Navigator for Current Functional Status.   Therapy/Group: Individual Therapy  Earnest Conroy  [place] 01/14/2016, 4:34 PM

## 2016-01-15 ENCOUNTER — Inpatient Hospital Stay (HOSPITAL_COMMUNITY): Payer: 59 | Admitting: Speech Pathology

## 2016-01-15 DIAGNOSIS — S0230XD Fracture of orbital floor, unspecified side, subsequent encounter for fracture with routine healing: Secondary | ICD-10-CM

## 2016-01-15 NOTE — Progress Notes (Signed)
[facility]     PROGRESS NOTE    Subjective/Complaints: Pt asking about d/c.  Not oriented to time  ROS still somewhat limited due to mental status  Objective: Vital Signs: Blood pressure 111/73, pulse 71, temperature 98.3 F (36.8 C), temperature source Oral, resp. rate 17, weight 76 kg (167 lb 9.6 oz), SpO2 97 %. Dg Swallowing Func-speech Pathology  Result Date: 01/14/2016 Objective Swallowing Evaluation: Type of Study: MBS-Modified Barium Swallow Study Patient Details Name: Justin Fox MRN: [id number] Date of Birth: 1960/07/04 Today's Date: 01/14/2016 Time: 900-940 Total time : 40 minutes Past Medical History: Past Medical History: Diagnosis Date . Hypertension  Past Surgical History: Past Surgical History: Procedure Laterality Date . COLONOSCOPY WITH PROPOFOL N/A 10/12/2015  Procedure: COLONOSCOPY WITH PROPOFOL;  Surgeon: Lucilla Lame, MD;  Location: [facility] ENDOSCOPY;  Service: Endoscopy;  Laterality: N/A; . NO PAST SURGERIES   HPI: See H&P No Data Recorded Assessment / Plan / Recommendation CHL IP CLINICAL IMPRESSIONS 01/14/2016 Therapy Diagnosis -- Clinical Impression Patient demonstrates a moderate oropharyngeal dysphagia which is further impacted by cognitive impairments.  Patient's oral phase is characterized by decreased attention to bolus with decreased coordination with premature spillage across all consistencies- particularly exacerbated during trials with teaspoon.  Pharyngeal phase is characterized by delayed swallow initiation with sluggish epiglottic inversion resulting in moderate vallecular residue that is reduced with multiple swallows, however, patient unable to utilize multiple swallows consistently due to cognitive impairments.  Patient consistently demonstrated deep silent penetration of honey-thick liquids via cup with likely trace aspiration. Intermittent silent aspiration with nectar-thick liquids. Intermittent aspiration with mildly  delayed cough response with thin. Aspiration with thin noted most regularly when pt had pre-exisiting residue from previous bolus prior to trial of thins. Unable to trial any other compensatory techniques secondary to pt's cognition. Recommend: Trials of Dys 2 with thin with 2 swallows/bite with SLP prior to implementation as current diet. Medications will continue to be crushed as pt masticated trial of barium tablet despite explicit instructions to not chew, just swallow.    Impact on safety and function Moderate aspiration risk   CHL IP TREATMENT RECOMMENDATION 01/05/2016 Treatment Recommendations Therapy as outlined in treatment plan below   Prognosis 01/14/2016 Prognosis for Safe Diet Advancement Good Barriers to Reach Goals Cognitive deficits Barriers/Prognosis Comment -- CHL IP DIET RECOMMENDATION 01/14/2016 SLP Diet Recommendations Dysphagia 1 (Puree) solids;Honey thick liquids Liquid Administration via Cup Medication Administration Crushed with puree Compensations Minimize environmental distractions;Slow rate;Small sips/bites;Multiple dry swallows after each bite/sip;Clear throat intermittently Postural Changes Remain semi-upright after after feeds/meals (Comment);Seated upright at 90 degrees   CHL IP OTHER RECOMMENDATIONS 01/14/2016 Recommended Consults -- Oral Care Recommendations Oral care BID Other Recommendations --   CHL IP FOLLOW UP RECOMMENDATIONS 01/14/2016 Follow up Recommendations Home health SLP;24 hour supervision/assistance   CHL IP FREQUENCY AND DURATION 01/14/2016 Speech Therapy Frequency (ACUTE ONLY) min 5x/week Treatment Duration 2 weeks      CHL IP ORAL PHASE 01/14/2016 Oral Phase Impaired Oral - Pudding Teaspoon -- Oral - Pudding Cup -- Oral - Honey Teaspoon -- Oral - Honey Cup -- Oral - Nectar Teaspoon -- Oral - Nectar Cup -- Oral - Nectar Straw -- Oral - Thin Teaspoon -- Oral - Thin Cup -- Oral - Thin Straw -- Oral - Puree -- Oral - Mech Soft -- Oral - Regular -- Oral -  Multi-Consistency -- Oral - Pill -- Oral Phase - Comment premature spillage with all consistencies to the levels of the  valleculae/pyriform depending on bolus size  CHL IP PHARYNGEAL PHASE 01/14/2016 Pharyngeal Phase Impaired Pharyngeal- Pudding Teaspoon -- Pharyngeal -- Pharyngeal- Pudding Cup -- Pharyngeal -- Pharyngeal- Honey Teaspoon -- Pharyngeal -- Pharyngeal- Honey Cup Penetration/Aspiration during swallow;Trace aspiration Pharyngeal Material enters airway, passes BELOW cords without attempt by patient to eject out (silent aspiration) Pharyngeal- Nectar Teaspoon -- Pharyngeal -- Pharyngeal- Nectar Cup Penetration/Apiration after swallow;Moderate aspiration Pharyngeal Material enters airway, passes BELOW cords without attempt by patient to eject out (silent aspiration) Pharyngeal- Nectar Straw -- Pharyngeal -- Pharyngeal- Thin Teaspoon Penetration/Apiration after swallow;Trace aspiration Pharyngeal Material enters airway, CONTACTS cords and not ejected out Pharyngeal- Thin Cup Penetration/Apiration after swallow;Moderate aspiration Pharyngeal Material enters airway, passes BELOW cords and not ejected out despite cough attempt by patient;Material enters airway, passes BELOW cords then ejected out Pharyngeal- Thin Straw -- Pharyngeal -- Pharyngeal- Puree Pharyngeal residue - pyriform Pharyngeal -- Pharyngeal- Mechanical Soft -- Pharyngeal -- Pharyngeal- Regular Pharyngeal residue - pyriform Pharyngeal -- Pharyngeal- Multi-consistency -- Pharyngeal -- Pharyngeal- Pill Pharyngeal residue - pyriform Pharyngeal -- Pharyngeal Comment delayed initiation with sluggish epiglottic inversion  CHL IP CERVICAL ESOPHAGEAL PHASE 01/14/2016 Cervical Esophageal Phase WFL Pudding Teaspoon -- Pudding Cup -- Honey Teaspoon -- Honey Cup -- Nectar Teaspoon -- Nectar Cup -- Nectar Straw -- Thin Teaspoon -- Thin Cup -- Thin Straw -- Puree -- Mechanical Soft -- Regular -- Multi-consistency -- Pill -- Cervical Esophageal Comment -- No  flowsheet data found. Vinetta Bergamo MA, CCC-SLP 01/14/2016, 12:51 PM              No results for input(s): WBC, HGB, HCT, PLT in the last 72 hours. No results for input(s): NA, K, CL, GLUCOSE, BUN, CREATININE, CALCIUM in the last 72 hours.  Invalid input(s): CO CBG (last 3)  No results for input(s): GLUCAP in the last 72 hours.  Wt Readings from Last 3 Encounters:  01/12/16 76 kg (167 lb 9.6 oz)  11/23/15 80 kg (176 lb 5.9 oz)  10/12/15 86.2 kg (190 lb)    Physical Exam:  Constitutional: He appears well-developed. NAD. Eyes: Right eye ptosis.  Conj are normal.  Neck: Normal range of motion. Neck supple.  Cardiovascular: Normal rateand regular rhythm.  Respiratory: Effort normaland breath sounds normal. No respiratory distress.  GI: Soft. Bowel sounds are normal. He exhibits no distension.  PEG tube in place Neurological: He is awake and fairly alert   Oriented to self only, sometimes hospital. Perseverative at times. more redirectable. Memory improving. Moves all 4's  Sensation appears to be intact to light touch throughout. DTRs 3+ LLE. Musc: no wrist/forearm pain Skin:  Edges of absorbable suture exposed above skin Psych: Unable to assess to due cognition  Assessment/Plan: 1. 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 Physiatrist is providing close team supervision and 24 hour management of active medical problems listed below. Physiatrist and rehab team continue to assess barriers to discharge/monitor patient progress toward functional and medical goals.  Function:  Bathing Bathing position Bathing activity did not occur: Refused Position: Shower  Bathing parts Body parts bathed by patient: Right arm, Left arm, Chest, Abdomen, Front perineal area, Right upper leg, Left upper leg Body parts bathed by helper: Buttocks, Right lower  leg, Left lower leg  Bathing assist Assist Level: Supervision or verbal cues      Upper Body Dressing/Undressing Upper body dressing   What is the patient wearing?:  Pull over shirt/dress     Pull over shirt/dress - Perfomed by patient: Thread/unthread right sleeve, Thread/unthread left sleeve, Put head through opening, Pull shirt over trunk Pull over shirt/dress - Perfomed by helper: Pull shirt over trunk        Upper body assist Assist Level: Supervision or verbal cues   Set up : To obtain clothing/put away  Lower Body Dressing/Undressing Lower body dressing   What is the patient wearing?: Underwear, Pants, Shoes, Socks Underwear - Performed by patient: Thread/unthread right underwear leg, Thread/unthread left underwear leg, Pull underwear up/down Underwear - Performed by helper: Thread/unthread right underwear leg, Thread/unthread left underwear leg Pants- Performed by patient: Thread/unthread right pants leg, Thread/unthread left pants leg, Pull pants up/down Pants- Performed by helper: Thread/unthread right pants leg, Thread/unthread left pants leg   Non-skid slipper socks- Performed by helper: Don/doff right sock, Don/doff left sock   Socks - Performed by helper: Don/doff right sock, Don/doff left sock Shoes - Performed by patient: Don/doff right shoe, Don/doff left shoe Shoes - Performed by helper: Fasten right, Fasten left, Don/doff left shoe, Don/doff right shoe       TED Hose - Performed by helper: Don/doff right TED hose, Don/doff left TED hose  Lower body assist Assist for lower body dressing: Touching or steadying assistance (Pt > 75%)      Toileting Toileting Toileting activity did not occur: No continent bowel/bladder event Toileting steps completed by patient: Adjust clothing prior to toileting, Performs perineal hygiene, Adjust clothing after toileting Toileting steps completed by helper: Performs perineal hygiene, Adjust clothing after toileting Toileting  Assistive Devices: Grab bar or rail  Toileting assist Assist level: Touching or steadying assistance (Pt.75%)   Transfers Chair/bed transfer   Chair/bed transfer method: Ambulatory Chair/bed transfer assist level: Touching or steadying assistance (Pt > 75%)       Locomotion Ambulation     Max distance: 163ft Assist level: Touching or steadying assistance (Pt > 75%)   Wheelchair   Type: Manual Max wheelchair distance: 150 ft (BLE) Assist Level: Supervision or verbal cues  Cognition Comprehension Comprehension assist level: Understands basic 50 - 74% of the time/ requires cueing 25 - 49% of the time  Expression Expression assist level: Expresses basic 50 - 74% of the time/requires cueing 25 - 49% of the time. Needs to repeat parts of sentences.  Social Interaction Social Interaction assist level: Interacts appropriately 50 - 74% of the time - May be physically or verbally inappropriate.  Problem Solving Problem solving assist level: Solves basic 50 - 74% of the time/requires cueing 25 - 49% of the time  Memory Memory assist level: Recognizes or recalls 25 - 49% of the time/requires cueing 50 - 75% of the time   Medical Problem List and Plan: 1. 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 Status post ORIF of multiple facial fractures 12/08/2015  -continue CIR therapies, PT, OT , SLP  -wife has arranged OM[facility] follow up at [facility] after discharge from hospital 2. DVT Prophylaxis/Anticoagulation: Subcutaneous Lovenox.   -dopplers negative 3. Pain Management: will use tramadol and tylenol for pain.  4. Mood: Seroquel 12.5 mg twice a day--continue at current dosing  -ritalin-continue at 10mg  as long as not agitated by dose 5. Neuropsych: This patient is notcapable of making decisions on hisown behalf. 6. Skin/Wound Care: Routine skin checks  -may try to remove retained suture on scalp this week 7. Fluids/Electrolytes/Nutrition: PO  reasonable 8.Tracheostomy. Decannulated. Marland Kitchen 9.Dysphagia. Gastrostomy tube 12/03/2015.Site  looks good Currently on a dysphagia #1 honey thick liquid diet.  -would stay with soft diet given facial fractures regardless of how much liquids are advanced  -PEG out soon? 9.Hypertension. Norvasc 10 mg daily, Coreg 12.5 mg twice a day. Monitor with increased mobility 10.Nondisplaced right radial styloid fracture. Nonweightbearing 11. Comminuted distal left radius fracture Status post closed reduction.    -WBAT bilaterally    12..Urinary retention. Flomax 0.8 mg daily.   -still incontinent  13.Constipation. Laxative assistance   LOS (Days) 19 A FACE TO FACE EVALUATION WAS PERFORMED  Charlett Blake 01/15/2016 7:33 AM

## 2016-01-15 NOTE — Progress Notes (Signed)
Speech Language Pathology Daily Session Note  Patient Details  Name: Justin Fox MRN: [id number] Date of Birth: 03-13-1961  Today's Date: 01/15/2016 SLP Individual Time: 1300-1345 SLP Individual Time Calculation (min): 45 min   Short Term Goals: Week 3: SLP Short Term Goal 1 (Week 3): Patient will consume current diet with minimal overt s/s of aspiration with Min A verbal cues for use of swallowing compensatory strategies.  SLP Short Term Goal 2 (Week 3): Patient will utilize multiple swallows during trials of nectar-thick liquids with minimal overt s/s of aspiration and Min A verbal cues.  SLP Short Term Goal 3 (Week 3): Patient will identify 1 cognitive and 1 physical deficit with Mod A multimodal cues. SLP Short Term Goal 4 (Week 3): Patient will demonstrate sustained attention to functional tasks for ~5 minutes with Max A verbal cues for redirection.  SLP Short Term Goal 5 (Week 3): Patient will orient to place and situation with Mod A question and visual cues.   Skilled Therapeutic Interventions:Skilled therapy intervention focused on dysphagia and cognitive goals. Given trials of thin water with intermittent cough/throat clear. Consistent verbal cueing and environmental control yielded 100% acc with achieving 2 swallows per solid bolus. Pt and spouse educated re: plan for diet upgrade to Dys 1 with thin water only. Spouse verbalized understanding. Pt required min- mod A to recall swallowing strategies of multiple swallows and small sips. Pt responded well to reversing his questions (asking him the questions he was repeatedly asking his spouse and therapist) and was able to answer them with approximately 50% acc.    Function:  Eating Eating   Modified Consistency Diet: Yes Eating Assist Level: Supervision or verbal cues;More than reasonable amount of time;Set up assist for   Eating Set Up Assist For: Opening containers       Cognition Comprehension Comprehension assist level:  Understands basic 25 - 49% of the time/ requires cueing 50 - 75% of the time  Expression   Expression assist level: Expresses basic 50 - 74% of the time/requires cueing 25 - 49% of the time. Needs to repeat parts of sentences.  Social Interaction Social Interaction assist level: Interacts appropriately 50 - 74% of the time - May be physically or verbally inappropriate.  Problem Solving Problem solving assist level: Solves basic 25 - 49% of the time - needs direction more than half the time to initiate, plan or complete simple activities  Memory Memory assist level: Recognizes or recalls 25 - 49% of the time/requires cueing 50 - 75% of the time    Pain Pain Assessment Pain Assessment: No/denies pain  Therapy/Group: Individual Therapy  Vinetta Bergamo MA, CCC-SLP 01/15/2016, 3:39 PM

## 2016-01-16 ENCOUNTER — Inpatient Hospital Stay (HOSPITAL_COMMUNITY): Payer: 59 | Admitting: Occupational Therapy

## 2016-01-16 LAB — GLUCOSE, CAPILLARY: GLUCOSE-CAPILLARY: 113 mg/dL — AB (ref 65–99)

## 2016-01-16 NOTE — Progress Notes (Signed)
Occupational Therapy Session Note  Patient Details  Name: Justin Fox MRN: [id number] Date of Birth: 1960/11/26  Today's Date: 01/16/2016 OT Individual Time: 5056-9794 OT Individual Time Calculation (min): 58 min     Short Term Goals: Week 1:  OT Short Term Goal 1 (Week 1): Pt will perform toilet transfer with mod A in order to decrease level of functional transfers.  OT Short Term Goal 1 - Progress (Week 1): Progressing toward goal OT Short Term Goal 2 (Week 1): Pt will engage in 2 minutes of sustained attention during functional task.  OT Short Term Goal 2 - Progress (Week 1): Progressing toward goal OT Short Term Goal 3 (Week 1): Pt will perform grooming tasks with min A. OT Short Term Goal 3 - Progress (Week 1): Progressing toward goal OT Short Term Goal 4 (Week 1): Pt will perform UB dressing with min A.  OT Short Term Goal 4 - Progress (Week 1): Met Week 2:  OT Short Term Goal 1 (Week 2): Pt will perform toilet transfers with mod A OT Short Term Goal 1 - Progress (Week 2): Met OT Short Term Goal 2 (Week 2): Pt will maintain sustained attention for 2 minutes during functional task OT Short Term Goal 2 - Progress (Week 2): Progressing toward goal OT Short Term Goal 3 (Week 2): Pt will perform grooming tasks (wash face, brush teetn, and shave) with min A OT Short Term Goal 3 - Progress (Week 2): Met OT Short Term Goal 4 (Week 2): Pt will perform bathing tasks with min A OT Short Term Goal 4 - Progress (Week 2): Progressing toward goal OT Short Term Goal 5 (Week 2): Pt will perform LB dressing tasks with min A OT Short Term Goal 5 - Progress (Week 2): Met Week 3:  OT Short Term Goal 1 (Week 3): Pt will maintain sustained atteniton for 2 mins during functional tasks with min verbal cues to redirect OT Short Term Goal 2 (Week 3): Pt will perform bathing tasks with min A OT Short Term Goal 3 (Week 3): Pt will perform toileting tasks at supervision level OT Short Term Goal 4 (Week  3): Pt will initiate and complete UB and LB dressing tasks with supervision when presented with clothing  Skilled Therapeutic Interventions/Progress Updates:   Pt was lying in bed at time of arrival and was agreeable to complete dressing with supervision for sequencing at EOB. Pt was transferred to chair for breakfast with pt requiring cues for pacing and swallowing. Afterwards pt ambulated to dayroom with min guard for cognitive skill retraining task involving hiding items and retracing steps to find them.Pt exhibited 85% accuracy with locating items with min questioning cues. Improved attention to task as well as short term memory today than during previous sessions. Pt still reports fatigue and sits down frequently. Cues required for safety with hand placement during sit<stand. Afterwards pt was returned to room (unable to pathfind way to room) and was returned to bed with all needs within reach and bed alarm activated.   Therapy Documentation Precautions:  Precautions Precautions: Fall Restrictions Weight Bearing Restrictions: Yes RUE Weight Bearing: Weight bearing as tolerated LUE Weight Bearing: Weight bearing as tolerated Other Position/Activity Restrictions: pt. has cast on left forearm, splint on right forearm - can remove Right splint    Pain: No c/o pain       See Function Navigator for Current Functional Status.   Therapy/Group: Individual Therapy  Korissa Horsford A Schawn Byas 01/16/2016, 12:42 PM

## 2016-01-16 NOTE — Progress Notes (Signed)
[place] PHYSICAL MEDICINE & REHABILITATION     PROGRESS NOTE    Subjective/Complaints: Pt working with OT this am no new issues  ROS still somewhat limited due to mental status  Objective: Vital Signs: Blood pressure 110/77, pulse 75, temperature 97.9 F (36.6 C), temperature source Oral, resp. rate 18, weight 76 kg (167 lb 9.6 oz), SpO2 95 %. Dg Swallowing Func-speech Pathology  Result Date: 01/14/2016 Objective Swallowing Evaluation: Type of Study: MBS-Modified Barium Swallow Study Patient Details Name: Justin Fox MRN: [id number] Date of Birth: May 31, 1960 Today's Date: 01/14/2016 Time: 900-940 Total time : 40 minutes Past Medical History: Past Medical History: Diagnosis Date . Hypertension  Past Surgical History: Past Surgical History: Procedure Laterality Date . COLONOSCOPY WITH PROPOFOL N/A 10/12/2015  Procedure: COLONOSCOPY WITH PROPOFOL;  Surgeon: Lucilla Lame, MD;  Location: [facility] ENDOSCOPY;  Service: Endoscopy;  Laterality: N/A; . NO PAST SURGERIES   HPI: See H&P No Data Recorded Assessment / Plan / Recommendation CHL IP CLINICAL IMPRESSIONS 01/14/2016 Therapy Diagnosis -- Clinical Impression Patient demonstrates a moderate oropharyngeal dysphagia which is further impacted by cognitive impairments.  Patient's oral phase is characterized by decreased attention to bolus with decreased coordination with premature spillage across all consistencies- particularly exacerbated during trials with teaspoon.  Pharyngeal phase is characterized by delayed swallow initiation with sluggish epiglottic inversion resulting in moderate vallecular residue that is reduced with multiple swallows, however, patient unable to utilize multiple swallows consistently due to cognitive impairments.  Patient consistently demonstrated deep silent penetration of honey-thick liquids via cup with likely trace aspiration. Intermittent silent aspiration with nectar-thick liquids. Intermittent aspiration with mildly delayed  cough response with thin. Aspiration with thin noted most regularly when pt had pre-exisiting residue from previous bolus prior to trial of thins. Unable to trial any other compensatory techniques secondary to pt's cognition. Recommend: Trials of Dys 2 with thin with 2 swallows/bite with SLP prior to implementation as current diet. Medications will continue to be crushed as pt masticated trial of barium tablet despite explicit instructions to not chew, just swallow.    Impact on safety and function Moderate aspiration risk   CHL IP TREATMENT RECOMMENDATION 01/05/2016 Treatment Recommendations Therapy as outlined in treatment plan below   Prognosis 01/14/2016 Prognosis for Safe Diet Advancement Good Barriers to Reach Goals Cognitive deficits Barriers/Prognosis Comment -- CHL IP DIET RECOMMENDATION 01/14/2016 SLP Diet Recommendations Dysphagia 1 (Puree) solids;Honey thick liquids Liquid Administration via Cup Medication Administration Crushed with puree Compensations Minimize environmental distractions;Slow rate;Small sips/bites;Multiple dry swallows after each bite/sip;Clear throat intermittently Postural Changes Remain semi-upright after after feeds/meals (Comment);Seated upright at 90 degrees   CHL IP OTHER RECOMMENDATIONS 01/14/2016 Recommended Consults -- Oral Care Recommendations Oral care BID Other Recommendations --   CHL IP FOLLOW UP RECOMMENDATIONS 01/14/2016 Follow up Recommendations Home health SLP;24 hour supervision/assistance   CHL IP FREQUENCY AND DURATION 01/14/2016 Speech Therapy Frequency (ACUTE ONLY) min 5x/week Treatment Duration 2 weeks      CHL IP ORAL PHASE 01/14/2016 Oral Phase Impaired Oral - Pudding Teaspoon -- Oral - Pudding Cup -- Oral - Honey Teaspoon -- Oral - Honey Cup -- Oral - Nectar Teaspoon -- Oral - Nectar Cup -- Oral - Nectar Straw -- Oral - Thin Teaspoon -- Oral - Thin Cup -- Oral - Thin Straw -- Oral - Puree -- Oral - Mech Soft -- Oral - Regular -- Oral - Multi-Consistency --  Oral - Pill -- Oral Phase - Comment premature spillage with all consistencies to the levels of the  valleculae/pyriform depending on bolus size  CHL IP PHARYNGEAL PHASE 01/14/2016 Pharyngeal Phase Impaired Pharyngeal- Pudding Teaspoon -- Pharyngeal -- Pharyngeal- Pudding Cup -- Pharyngeal -- Pharyngeal- Honey Teaspoon -- Pharyngeal -- Pharyngeal- Honey Cup Penetration/Aspiration during swallow;Trace aspiration Pharyngeal Material enters airway, passes BELOW cords without attempt by patient to eject out (silent aspiration) Pharyngeal- Nectar Teaspoon -- Pharyngeal -- Pharyngeal- Nectar Cup Penetration/Apiration after swallow;Moderate aspiration Pharyngeal Material enters airway, passes BELOW cords without attempt by patient to eject out (silent aspiration) Pharyngeal- Nectar Straw -- Pharyngeal -- Pharyngeal- Thin Teaspoon Penetration/Apiration after swallow;Trace aspiration Pharyngeal Material enters airway, CONTACTS cords and not ejected out Pharyngeal- Thin Cup Penetration/Apiration after swallow;Moderate aspiration Pharyngeal Material enters airway, passes BELOW cords and not ejected out despite cough attempt by patient;Material enters airway, passes BELOW cords then ejected out Pharyngeal- Thin Straw -- Pharyngeal -- Pharyngeal- Puree Pharyngeal residue - pyriform Pharyngeal -- Pharyngeal- Mechanical Soft -- Pharyngeal -- Pharyngeal- Regular Pharyngeal residue - pyriform Pharyngeal -- Pharyngeal- Multi-consistency -- Pharyngeal -- Pharyngeal- Pill Pharyngeal residue - pyriform Pharyngeal -- Pharyngeal Comment delayed initiation with sluggish epiglottic inversion  CHL IP CERVICAL ESOPHAGEAL PHASE 01/14/2016 Cervical Esophageal Phase WFL Pudding Teaspoon -- Pudding Cup -- Honey Teaspoon -- Honey Cup -- Nectar Teaspoon -- Nectar Cup -- Nectar Straw -- Thin Teaspoon -- Thin Cup -- Thin Straw -- Puree -- Mechanical Soft -- Regular -- Multi-consistency -- Pill -- Cervical Esophageal Comment -- No flowsheet data found.  Vinetta Bergamo MA, CCC-SLP 01/14/2016, 12:51 PM              No results for input(s): WBC, HGB, HCT, PLT in the last 72 hours. No results for input(s): NA, K, CL, GLUCOSE, BUN, CREATININE, CALCIUM in the last 72 hours.  Invalid input(s): CO CBG (last 3)   Recent Labs  01/16/16 0638  GLUCAP 113*    Wt Readings from Last 3 Encounters:  01/12/16 76 kg (167 lb 9.6 oz)  11/23/15 80 kg (176 lb 5.9 oz)  10/12/15 86.2 kg (190 lb)    Physical Exam:  Constitutional: He appears well-developed. NAD. Eyes: Right eye ptosis.  Conj are normal.  Neck: Normal range of motion. Neck supple.  Cardiovascular: Normal rateand regular rhythm.  Respiratory: Effort normaland breath sounds normal. No respiratory distress.  GI: Soft. Bowel sounds are normal. He exhibits no distension.  PEG tube in place Neurological: He is awake and fairly alert   Oriented to self only, sometimes hospital. Perseverative at times. more redirectable. Memory improving. Moves all 4's  Sensation appears to be intact to light touch throughout. DTRs 3+ LLE. Musc: no wrist/forearm pain Skin:  Edges of absorbable suture exposed above skin Psych: Unable to assess to due cognition  Assessment/Plan: 1. 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 Physiatrist is providing close team supervision and 24 hour management of active medical problems listed below. Physiatrist and rehab team continue to assess barriers to discharge/monitor patient progress toward functional and medical goals.  Function:  Bathing Bathing position Bathing activity did not occur: Refused Position: Shower  Bathing parts Body parts bathed by patient: Right arm, Left arm, Chest, Abdomen, Front perineal area, Right upper leg, Left upper leg Body parts bathed by helper: Buttocks, Right lower leg, Left lower leg   Bathing assist Assist Level: Supervision or verbal cues      Upper Body Dressing/Undressing Upper body dressing   What is the patient  wearing?: Pull over shirt/dress     Pull over shirt/dress - Perfomed by patient: Thread/unthread right sleeve, Thread/unthread left sleeve, Put head through opening, Pull shirt over trunk Pull over shirt/dress - Perfomed by helper: Pull shirt over trunk        Upper body assist Assist Level: Supervision or verbal cues   Set up : To obtain clothing/put away  Lower Body Dressing/Undressing Lower body dressing   What is the patient wearing?: Underwear, Pants, Shoes, Socks Underwear - Performed by patient: Thread/unthread right underwear leg, Thread/unthread left underwear leg, Pull underwear up/down Underwear - Performed by helper: Thread/unthread right underwear leg, Thread/unthread left underwear leg Pants- Performed by patient: Thread/unthread right pants leg, Thread/unthread left pants leg, Pull pants up/down Pants- Performed by helper: Thread/unthread right pants leg, Thread/unthread left pants leg   Non-skid slipper socks- Performed by helper: Don/doff right sock, Don/doff left sock   Socks - Performed by helper: Don/doff right sock, Don/doff left sock Shoes - Performed by patient: Don/doff right shoe, Don/doff left shoe Shoes - Performed by helper: Fasten right, Fasten left, Don/doff left shoe, Don/doff right shoe       TED Hose - Performed by helper: Don/doff right TED hose, Don/doff left TED hose  Lower body assist Assist for lower body dressing: Touching or steadying assistance (Pt > 75%)      Toileting Toileting Toileting activity did not occur: No continent bowel/bladder event Toileting steps completed by patient: Adjust clothing prior to toileting, Performs perineal hygiene, Adjust clothing after toileting Toileting steps completed by helper: Performs perineal hygiene, Adjust clothing after toileting Toileting Assistive Devices: Grab  bar or rail  Toileting assist Assist level: Touching or steadying assistance (Pt.75%)   Transfers Chair/bed transfer   Chair/bed transfer method: Ambulatory Chair/bed transfer assist level: Touching or steadying assistance (Pt > 75%)       Locomotion Ambulation     Max distance: 168ft Assist level: Touching or steadying assistance (Pt > 75%)   Wheelchair   Type: Manual Max wheelchair distance: 150 ft (BLE) Assist Level: Supervision or verbal cues  Cognition Comprehension Comprehension assist level: Understands basic 25 - 49% of the time/ requires cueing 50 - 75% of the time  Expression Expression assist level: Expresses basic 50 - 74% of the time/requires cueing 25 - 49% of the time. Needs to repeat parts of sentences.  Social Interaction Social Interaction assist level: Interacts appropriately 50 - 74% of the time - May be physically or verbally inappropriate.  Problem Solving Problem solving assist level: Solves basic 25 - 49% of the time - needs direction more than half the time to initiate, plan or complete simple activities  Memory Memory assist level: Recognizes or recalls 25 - 49% of the time/requires cueing 50 - 75% of the time   Medical Problem List and Plan: 1. 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 Status post ORIF of multiple facial fractures 12/08/2015  -continue CIR therapies, PT, OT , SLP  -wife has arranged OMFS follow up at [facility] after discharge from hospital 2. DVT Prophylaxis/Anticoagulation: Subcutaneous Lovenox.   -dopplers negative 3. Pain Management: will use tramadol and tylenol for pain.  4. Mood: Seroquel 12.5 mg twice a day--continue at current dosing  -ritalin-continue at 10mg  as long as not agitated by dose 5. Neuropsych: This patient is notcapable of making decisions on hisown behalf. 6. Skin/Wound Care: Routine skin checks  -may try to remove retained suture on scalp this week 7.  Fluids/Electrolytes/Nutrition:  PO reasonable 8.Tracheostomy. Decannulated. Marland Kitchen 9.Dysphagia. Gastrostomy tube 12/03/2015.Site looks good Currently on a dysphagia #1 honey thick liquid diet.  -would stay with soft diet given facial fractures regardless of how much liquids are advanced  -PEG out soon? 9.Hypertension. Norvasc 10 mg daily, Coreg 12.5 mg twice a day. Monitor with increased mobility 10.Nondisplaced right radial styloid fracture. Nonweightbearing 11. Comminuted distal left radius fracture Status post closed reduction.    -WBAT bilaterally    12..Urinary retention. Flomax 0.8 mg daily.   -still incontinent, likely cognition related  13.Constipation. Laxative assistance   LOS (Days) 20 A FACE TO FACE EVALUATION WAS PERFORMED  Eliaz Fout E 01/16/2016 9:22 AM

## 2016-01-16 NOTE — Progress Notes (Signed)
01/16/16 1542 nursing Per daughter pt was having urgency this morning and was asking urine culture result. RN notified MD of urine culture. New orders noted.

## 2016-01-17 ENCOUNTER — Inpatient Hospital Stay (HOSPITAL_COMMUNITY): Payer: 59

## 2016-01-17 ENCOUNTER — Inpatient Hospital Stay (HOSPITAL_COMMUNITY): Payer: 59 | Admitting: Speech Pathology

## 2016-01-17 ENCOUNTER — Inpatient Hospital Stay (HOSPITAL_COMMUNITY): Payer: 59 | Admitting: Physical Therapy

## 2016-01-17 LAB — URINALYSIS, ROUTINE W REFLEX MICROSCOPIC
Bilirubin Urine: NEGATIVE
GLUCOSE, UA: NEGATIVE mg/dL
Hgb urine dipstick: NEGATIVE
Ketones, ur: NEGATIVE mg/dL
LEUKOCYTES UA: NEGATIVE
NITRITE: NEGATIVE
PH: 7.5 (ref 5.0–8.0)
PROTEIN: NEGATIVE mg/dL
Specific Gravity, Urine: 1.011 (ref 1.005–1.030)

## 2016-01-17 LAB — URINE MICROSCOPIC-ADD ON: RBC / HPF: NONE SEEN RBC/hpf (ref 0–5)

## 2016-01-17 MED ORDER — DOCUSATE SODIUM 100 MG PO CAPS
100.0000 mg | ORAL_CAPSULE | Freq: Two times a day (BID) | ORAL | Status: DC
  Administered 2016-01-17 – 2016-01-25 (×15): 100 mg via ORAL
  Filled 2016-01-17 (×16): qty 1

## 2016-01-17 NOTE — Progress Notes (Signed)
Occupational Therapy Note  Patient Details  Name: Justin Fox MRN: [id number] Date of Birth: 24-Jan-1961  Today's Date: 01/17/2016 OT Individual Time: 1100-1200 OT Individual Time Calculation (min): 60 min    Pt denied pain Individual therapy  Pt resting in bed upon arrival and reluctant to sit EOB and get OOB to participate in therapy.  Pt required max encouragement and more than a reasonable amount of time to sit EOB and participate.  Pt amb with HHA (steady A) for simple home mgmt tasks in room.  Pt required multiple rest breaks and max encouragement throughout session to actively engage in therapy.  Pt returned to bed upon completion and remained in bed with alarm activated and all needs within reach.  Pt activated nursing call bell and when asked if he needed anything he stated he didn't know. When asked if he wanted the TV turned off, pt stated "sure. The noise bothers me."   [name], [name] 01/17/2016, 2:58 PM

## 2016-01-17 NOTE — Progress Notes (Signed)
Occupational Therapy Session Note  Patient Details  Name: KYRE OLSHANSKY MRN: [id number] Date of Birth: [birth year]  Today's Date: 01/17/2016 OT Individual Time: 0800-0900 OT Individual Time Calculation (min): 60 min     Short Term Goals: Week 3:  OT Short Term Goal 1 (Week 3): Pt will maintain sustained atteniton for 2 mins during functional tasks with min verbal cues to redirect OT Short Term Goal 2 (Week 3): Pt will perform bathing tasks with min A OT Short Term Goal 3 (Week 3): Pt will perform toileting tasks at supervision level OT Short Term Goal 4 (Week 3): Pt will initiate and complete UB and LB dressing tasks with supervision when presented with clothing  Skilled Therapeutic Interventions/Progress Updates:    Pt resting in w/c upon arrival with wife present.  Pt initially engaged in shaving task.  Pt required max verbal cues for task initiation and min verbal cues for sequencing.  Pt completed approx 90% of task.  Therapist assisted for thoroughness.  Pt amb with HHA to therapy gym and initially engaged in pipe tree activity with min verbal cues for accuracy.  Pt requested to use bathroom and amb back to room to use toilet.  Pt returned to gym and engaged in peg board tasks duplicating pattern from pictures.  Pt required max verbal cues for accuracy.  Pt returned to room and remained in bed with alarm activated and all needs within reach. Focus on attention to task, task initiation, sequencing, active participation, functional amb with HHA, cognitive remediation, and safety awareness to increase independence with BADLS. Therapy Documentation Precautions:  Precautions Precautions: Fall Restrictions Weight Bearing Restrictions: Yes RUE Weight Bearing: Weight bearing as tolerated LUE Weight Bearing: Weight bearing as tolerated  See Function Navigator for Current Functional Status.   Therapy/Group: Individual Therapy  Bienvenido, Novosel 01/17/2016, 9:07 AM

## 2016-01-17 NOTE — Progress Notes (Signed)
Speech Language Pathology Daily Session Note  Patient Details  Name: Justin Fox MRN: [id number] Date of Birth: 1960/12/15  Today's Date: 01/17/2016 SLP Individual Time: [id number] SLP Individual Time Calculation (min): 57 min   Short Term Goals: Week 3: SLP Short Term Goal 1 (Week 3): Patient will consume current diet with minimal overt s/s of aspiration with Min A verbal cues for use of swallowing compensatory strategies.  SLP Short Term Goal 2 (Week 3): Patient will utilize multiple swallows during trials of nectar-thick liquids with minimal overt s/s of aspiration and Min A verbal cues.  SLP Short Term Goal 3 (Week 3): Patient will identify 1 cognitive and 1 physical deficit with Mod A multimodal cues. SLP Short Term Goal 4 (Week 3): Patient will demonstrate sustained attention to functional tasks for ~5 minutes with Max A verbal cues for redirection.  SLP Short Term Goal 5 (Week 3): Patient will orient to place and situation with Mod A question and visual cues.   Skilled Therapeutic Interventions: Skilled therapy intervention focused on cognitive goals. Patient engaged in structured therapy tasks for problem solving and selective attention, demonstrating up to 5 minutes of selective attention with Mod I to supervision verbal cues for redirection and Max A verbal and visual cues for problem solving. Education provided to patient about the external aids for orientation which are in his room; patient was then able to repeat back to therapist the purpose behind 3 of 4 external aids. At the end of session, patient demonstrated verbal frustration but was easily redirected. Patient left supine in bed with all needs within reach. Continue current plan of care.   Function:  Cognition Comprehension Comprehension assist level: Understands basic 25 - 49% of the time/ requires cueing 50 - 75% of the time  Expression   Expression assist level: Expresses basic 50 - 74% of the time/requires cueing 25  - 49% of the time. Needs to repeat parts of sentences.  Social Interaction Social Interaction assist level: Interacts appropriately 25 - 49% of time - Needs frequent redirection.  Problem Solving Problem solving assist level: Solves basic 25 - 49% of the time - needs direction more than half the time to initiate, plan or complete simple activities  Memory Memory assist level: Recognizes or recalls 25 - 49% of the time/requires cueing 50 - 75% of the time    Pain Pain Assessment Pain Assessment: No/denies pain  Therapy/Group: Individual Therapy  [name] 01/17/2016, 3:33 PM

## 2016-01-17 NOTE — Progress Notes (Signed)
Physical Therapy Session Note  Patient Details  Name: EARLEE AGE MRN: [id number] Date of Birth: 06-Jun-1960  Today's Date: 01/17/2016 PT Individual Time: 1500-1530 PT Individual Time Calculation (min): 30 min   Skilled Therapeutic Interventions/Progress Updates:    Pt initially refusing OOB stating he just wanted to stay in bed and rest. With encouragement (and encouragement from familiar SLP), pt agreeable to therapy session. Focused on overall endurance/activity tolerance, gait on unit without AD with overall steadying assist for balance and cues for navigation and obtacle negotiaiton, bed mobility, stair negotiation with close supervision to steadying assist, dynamic balance and attention activity to play horseshoes, and overall transfers. Pt requires frequent redirection throughout session and max encouragement to continue participation in functional activities. Returned to bed end of session with pt asking about the date - used signs placed in room to assist patient with identifying this. All needs in reach and bed alarm on.    Therapy Documentation Precautions:  Precautions Precautions: Fall Restrictions Weight Bearing Restrictions: Yes RUE Weight Bearing: Weight bearing as tolerated LUE Weight Bearing: Weight bearing as tolerated  Pain: Pain Assessment Pain Assessment: No/denies pain    See Function Navigator for Current Functional Status.   Therapy/Group: Individual Therapy  Canary Brim Ivory Broad, PT, DPT  01/17/2016, 3:32 PM

## 2016-01-17 NOTE — Progress Notes (Signed)
[facility]     PROGRESS NOTE    Subjective/Complaints: No new issues. Had visitors over weekend. Generally remembered everyone. wfie states he did well with them.   ROS still somewhat limited due to mental status  Objective: Vital Signs: Blood pressure 99/61, pulse 74, temperature 98 F (36.7 C), temperature source Oral, resp. rate 18, weight 76 kg (167 lb 9.6 oz), SpO2 98 %. No results found. No results for input(s): WBC, HGB, HCT, PLT in the last 72 hours. No results for input(s): NA, K, CL, GLUCOSE, BUN, CREATININE, CALCIUM in the last 72 hours.  Invalid input(s): CO CBG (last 3)   Recent Labs  01/16/16 0638  GLUCAP 113*    Wt Readings from Last 3 Encounters:  01/12/16 76 kg (167 lb 9.6 oz)  11/23/15 80 kg (176 lb 5.9 oz)  10/12/15 86.2 kg (190 lb)    Physical Exam:  Constitutional: He appears well-developed. NAD. Eyes: Right eye ptosis.  Conj are normal.  Neck: Normal range of motion. Neck supple.  Cardiovascular: Normal rateand regular rhythm.  Respiratory: Effort normaland breath sounds normal. No respiratory distress.  GI: Soft. Bowel sounds are normal. He exhibits no distension.  PEG tube in place Neurological: He is awake and fairly alert   Oriented to self only, sometimes hospital. Perseverative at times. more redirectable. Memory improving. Moves all 4's  Sensation appears to be intact to light touch throughout. DTRs 3+ LLE. Musc: no wrist/forearm pain Skin:  Edges of absorbable suture exposed above skin Psych: Unable to assess to due cognition  Assessment/Plan: 1. 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 Physiatrist is providing close team supervision and 24 hour management of active medical problems listed below. Physiatrist and rehab team continue to assess barriers to  discharge/monitor patient progress toward functional and medical goals.  Function:  Bathing Bathing position Bathing activity did not occur: Refused Position: Production manager parts bathed by patient: Right arm, Left arm, Chest, Abdomen, Front perineal area, Right upper leg, Left upper leg Body parts bathed by helper: Buttocks, Right lower leg, Left lower leg  Bathing assist Assist Level: Supervision or verbal cues      Upper Body Dressing/Undressing Upper body dressing   What is the patient wearing?: Pull over shirt/dress     Pull over shirt/dress - Perfomed by patient: Thread/unthread right sleeve, Thread/unthread left sleeve, Put head through opening, Pull shirt over trunk Pull over shirt/dress - Perfomed by helper: Pull shirt over trunk        Upper body assist Assist Level: Supervision or verbal cues   Set up : To obtain clothing/put away  Lower Body Dressing/Undressing Lower body dressing   What is the patient wearing?: Underwear, Pants, Non-skid slipper socks Underwear - Performed by patient: Thread/unthread right underwear leg, Thread/unthread left underwear leg, Pull underwear up/down Underwear - Performed by helper: Thread/unthread right underwear leg, Thread/unthread left underwear leg Pants- Performed by patient: Thread/unthread right pants leg, Thread/unthread left pants leg, Pull pants up/down Pants- Performed by helper: Thread/unthread right pants leg, Thread/unthread left pants leg Non-skid slipper socks- Performed by patient: Don/doff right sock, Don/doff left sock Non-skid slipper socks- Performed by helper: Don/doff right sock, Don/doff left sock   Socks - Performed by helper: Don/doff right sock, Don/doff left sock Shoes - Performed by patient: Don/doff right shoe, Don/doff left shoe Shoes - Performed by helper:  Fasten right, Fasten left, Don/doff left shoe, Don/doff right shoe       TED Hose - Performed by helper: Don/doff right TED hose,  Don/doff left TED hose  Lower body assist Assist for lower body dressing: Touching or steadying assistance (Pt > 75%)      Toileting Toileting Toileting activity did not occur: No continent bowel/bladder event Toileting steps completed by patient: Adjust clothing prior to toileting, Performs perineal hygiene, Adjust clothing after toileting Toileting steps completed by helper: Performs perineal hygiene, Adjust clothing after toileting Toileting Assistive Devices: Grab bar or rail  Toileting assist Assist level: Touching or steadying assistance (Pt.75%)   Transfers Chair/bed transfer   Chair/bed transfer method: Stand pivot Chair/bed transfer assist level: Supervision or verbal cues       Locomotion Ambulation     Max distance: 175 Assist level: Touching or steadying assistance (Pt > 75%)   Wheelchair   Type: Manual Max wheelchair distance: 100 Assist Level: Supervision or verbal cues  Cognition Comprehension Comprehension assist level: Understands basic 25 - 49% of the time/ requires cueing 50 - 75% of the time  Expression Expression assist level: Expresses basic 50 - 74% of the time/requires cueing 25 - 49% of the time. Needs to repeat parts of sentences.  Social Interaction Social Interaction assist level: Interacts appropriately 50 - 74% of the time - May be physically or verbally inappropriate.  Problem Solving Problem solving assist level: Solves basic 25 - 49% of the time - needs direction more than half the time to initiate, plan or complete simple activities  Memory Memory assist level: Recognizes or recalls 25 - 49% of the time/requires cueing 50 - 75% of the time   Medical Problem List and Plan: 1. 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 Status post ORIF of multiple facial fractures 12/08/2015  -continue CIR therapies,    -wife has arranged OMFS follow up at [facility] after discharge from hospital 2. DVT  Prophylaxis/Anticoagulation: Subcutaneous Lovenox.   -dopplers negative 3. Pain Management: will use tramadol and tylenol for pain.  4. Mood: Seroquel 12.5 mg twice a day---will stop day time dosing  -ritalin-continue at 10mg  a  5. Neuropsych: This patient is notcapable of making decisions on hisown behalf. 6. Skin/Wound Care: Routine skin checks  -may try to remove retained suture on scalp this week 7. Fluids/Electrolytes/Nutrition: PO reasonable 8.Tracheostomy. Decannulated. Marland Kitchen 9.Dysphagia. Gastrostomy tube 12/03/2015.Site looks good Currently on a dysphagia #1 honey thick liquid diet.  -would stay with soft diet given facial fractures regardless of how much liquids are advanced  -PEG out soon? 9.Hypertension. Norvasc 10 mg daily, Coreg 12.5 mg twice a day. Monitor with increased mobility 10.Nondisplaced right radial styloid fracture. Nonweightbearing 11. Comminuted distal left radius fracture Status post closed reduction.    -WBAT bilaterally    12..Urinary retention. Flomax 0.8 mg daily.   -still incontinent at night, likely cognition related  -spoke with wife that I would expect gradual improvement after discharge 13.Constipation. Laxative assistance   LOS (Days) 21 A FACE TO FACE EVALUATION WAS PERFORMED  Tracker Mance T 01/17/2016 2:07 PM

## 2016-01-17 NOTE — Progress Notes (Signed)
Speech Language Pathology Daily Session Note  Patient Details  Name: Justin Fox MRN: [id number] Date of Birth: Feb 11, 1961  Today's Date: 01/17/2016 SLP Individual Time: 1430-1500 SLP Individual Time Calculation (min): 30 min   Short Term Goals: Week 3: SLP Short Term Goal 1 (Week 3): Patient will consume current diet with minimal overt s/s of aspiration with Min A verbal cues for use of swallowing compensatory strategies.  SLP Short Term Goal 2 (Week 3): Patient will utilize multiple swallows during trials of nectar-thick liquids with minimal overt s/s of aspiration and Min A verbal cues.  SLP Short Term Goal 3 (Week 3): Patient will identify 1 cognitive and 1 physical deficit with Mod A multimodal cues. SLP Short Term Goal 4 (Week 3): Patient will demonstrate sustained attention to functional tasks for ~5 minutes with Max A verbal cues for redirection.  SLP Short Term Goal 5 (Week 3): Patient will orient to place and situation with Mod A question and visual cues.   Skilled Therapeutic Interventions:Skilled treatment session focused on dysphagia and cognitive goals.  SLP facilitated session by providing set-up assist and Min A verbal cues for thoroughness with oral care via the suction toothbrush. Patient consumed thin liquids via cup with intermittent subtle throat clear X 2 and required Mod A verbal cues for use of multiple swallows. Patient required supervision question cues for orientation to date, place and situation and Max A verbal cues for problem solving during route finding task back to his room. Patient left supine in bed with bed alarm on and all needs within reach. Continue with current plan of care.   Function:  Eating Eating   Modified Consistency Diet: Yes Eating Assist Level: Supervision or verbal cues;More than reasonable amount of time           Cognition Comprehension Comprehension assist level: Understands basic 25 - 49% of the time/ requires cueing 50 - 75%  of the time  Expression   Expression assist level: Expresses basic 50 - 74% of the time/requires cueing 25 - 49% of the time. Needs to repeat parts of sentences.  Social Interaction Social Interaction assist level: Interacts appropriately 25 - 49% of time - Needs frequent redirection.  Problem Solving Problem solving assist level: Solves basic 25 - 49% of the time - needs direction more than half the time to initiate, plan or complete simple activities  Memory Memory assist level: Recognizes or recalls 25 - 49% of the time/requires cueing 50 - 75% of the time    Pain Pain Assessment Pain Assessment: No/denies pain  Therapy/Group: Individual Therapy  Justin Fox 01/17/2016, 3:43 PM

## 2016-01-17 NOTE — Progress Notes (Signed)
Physical Therapy Session Note  Patient Details  Name: Justin Fox MRN: [id number] Date of Birth: [birth year]  Today's Date: 01/17/2016 PT Individual Time: [id number] PT Individual Time Calculation (min): 29 min    Short Term Goals: Week 3:  PT Short Term Goal 1 (Week 3): Pt will ambulate 50 ft with supervision assist. PT Short Term Goal 2 (Week 3): Pt will negotiate 12 steps with min assist & B rails. PT Short Term Goal 3 (Week 3): Pt will negotiate single step without rails & mod assist. PT Short Term Goal 4 (Week 3): Pt will sustain attention to functional task for 30 seconds with minimal cuing.   Skilled Therapeutic Interventions/Progress Updates:   Pt received seated in w/c with handoff from OT after previous session. Propelled w/c to gym with BLEs for strengthening and endurance. Stand pivot transfer w/c >mat table with close S. Sitting and standing balance and activity tolerance while performing pipe tree with min verbal cues for sequencing to correctly build figures based on picture example. Requires encouragement to participate due to pt perseverative on wanting rest breaks and laying down on mat. Gait through cones for dynamic balance challenge with min guard, and able to retrieve 6 cones off of floor with min guard and increased time. Ascent/descent 12 stairs 3" and 6" height with B handrails and min guard faded to close S. Gait to return to room x175', min cues for navigation to locate room. Sit>supine with modI. Remained supine in bed with handoff to SLP for next session.   Therapy Documentation Precautions:  Precautions Precautions: Fall Restrictions Weight Bearing Restrictions: Yes RUE Weight Bearing: Weight bearing as tolerated LUE Weight Bearing: Weight bearing as tolerated Other Position/Activity Restrictions: pt. has cast on left forearm, splint on right forearm - can remove Right splint Pain: Pain Assessment Pain Assessment: No/denies pain   See Function Navigator  for Current Functional Status.   Therapy/Group: Individual Therapy  Luberta Mutter 01/17/2016, 9:58 AM

## 2016-01-18 ENCOUNTER — Inpatient Hospital Stay (HOSPITAL_COMMUNITY): Payer: 59 | Admitting: Speech Pathology

## 2016-01-18 ENCOUNTER — Inpatient Hospital Stay (HOSPITAL_COMMUNITY): Payer: 59 | Admitting: Occupational Therapy

## 2016-01-18 ENCOUNTER — Inpatient Hospital Stay (HOSPITAL_COMMUNITY): Payer: 59 | Admitting: Physical Therapy

## 2016-01-18 ENCOUNTER — Inpatient Hospital Stay (HOSPITAL_COMMUNITY): Payer: 59

## 2016-01-18 LAB — URINE CULTURE

## 2016-01-18 MED ORDER — TAMSULOSIN HCL 0.4 MG PO CAPS
0.4000 mg | ORAL_CAPSULE | Freq: Every day | ORAL | Status: DC
  Administered 2016-01-18 – 2016-01-23 (×6): 0.4 mg via ORAL
  Filled 2016-01-18 (×7): qty 1

## 2016-01-18 NOTE — Progress Notes (Signed)
Occupational Therapy Session Note  Patient Details  Name: Justin Fox MRN: [id number] Date of Birth: 1960-11-18  Today's Date: 01/18/2016 OT Individual Time: [id number] OT Individual Time Calculation (min): 58 min     Short Term Goals: Week 3:  OT Short Term Goal 1 (Week 3): Pt will maintain sustained atteniton for 2 mins during functional tasks with min verbal cues to redirect OT Short Term Goal 2 (Week 3): Pt will perform bathing tasks with min A OT Short Term Goal 3 (Week 3): Pt will perform toileting tasks at supervision level OT Short Term Goal 4 (Week 3): Pt will initiate and complete UB and LB dressing tasks with supervision when presented with clothing  Skilled Therapeutic Interventions/Progress Updates:    Pt sitting with wife upon arrival.  Pt amb with HHA to gym and initially engaged in 5 mins exercise on NuStep with focus on attending to task for 60 seconds before requesting to rest. Pt was able to perform task for 45 seconds max before requesting to rest.  Pt required 6 segments to complete 5 mins exercise.  Pt requested use of bathroom and amb to his room to use toilet.  Pt transitioned to playing a game of War with playing cards.  Pt recalled playing game in the past and was able to accurately determine who won each hand.  Pt attended to game throughout without verbal cues.  Pt required tot A for orientation to year and his age. Pt commented that his "brain got tired" when he had to think too much.  Pt stated he was trying to "figure it all out." Pt returned to room and remained in bed with all needs within reach and bed alarm activated.   Therapy Documentation Precautions:  Precautions Precautions: Fall Restrictions Weight Bearing Restrictions: Yes RUE Weight Bearing: Weight bearing as tolerated LUE Weight Bearing: Weight bearing as tolerated  Pain:  Pt denied pain  See Function Navigator for Current Functional Status.   Therapy/Group: Individual  Therapy  Arcangel, Kalisch 01/18/2016, 8:59 AM

## 2016-01-18 NOTE — Progress Notes (Addendum)
Physical Therapy Weekly Progress Note  Patient Details  Name: Justin Fox MRN: [id number] Date of Birth: 1960/08/17  Beginning of progress report period: January 11, 2016 End of progress report period: January 18, 2016   Patient has met 3 of 4 short term goals.  Pt improving balance and gait to close supervision to steadying assist. Pt able to ascend and descend stairs with min A to close supervision. Pt continues to vary in his ability to attend to functional and therapeutic tasks depending on fatigue and level of internal distraction.  Patient continues to demonstrate the following deficits: impaired attention, awareness, problem solving, balance and therefore will continue to benefit from skilled PT intervention to enhance overall performance with activity tolerance, balance, ability to compensate for deficits, attention, awareness and coordination.  Patient goals downgraded to min A for balance and gait due to slow progress and need for steadying assist.  Continue plan of care.  PT Short Term Goals Week 3:  PT Short Term Goal 1 (Week 3): Pt will ambulate 50 ft with supervision assist. PT Short Term Goal 1 - Progress (Week 3): Met PT Short Term Goal 2 (Week 3): Pt will negotiate 12 steps with min assist & B rails. PT Short Term Goal 2 - Progress (Week 3): Met PT Short Term Goal 3 (Week 3): Pt will negotiate single step without rails & mod assist. PT Short Term Goal 3 - Progress (Week 3): Met PT Short Term Goal 4 (Week 3): Pt will sustain attention to functional task for 30 seconds with minimal cuing.  PT Short Term Goal 4 - Progress (Week 3): Progressing toward goal   Skilled Therapeutic Interventions/Progress Updates: Ambulation/gait training;Community reintegration;Neuromuscular re-education;DME/adaptive equipment instruction;Stair training;UE/LE Strength taining/ROM;Wheelchair propulsion/positioning;UE/LE Coordination activities;Therapeutic Activities;Pain management;Discharge  planning;Balance/vestibular training;Functional electrical stimulation;Functional mobility training;Cognitive remediation/compensation;Patient/family education;Splinting/orthotics;Therapeutic Exercise     See Function Navigator for Current Functional Status.   Prescious Hurless 01/18/2016, 7:26 AM

## 2016-01-18 NOTE — Progress Notes (Signed)
Speech Language Pathology Daily Session Note  Patient Details  Name: Justin Fox MRN: [id number] Date of Birth: 20-Feb-1961  Today's Date: 01/18/2016 SLP Individual Time: 1000-1057 SLP Individual Time Calculation (min): 57 min   Short Term Goals: Week 3: SLP Short Term Goal 1 (Week 3): Patient will consume current diet with minimal overt s/s of aspiration with Min A verbal cues for use of swallowing compensatory strategies.  SLP Short Term Goal 2 (Week 3): Patient will utilize multiple swallows during trials of nectar-thick liquids with minimal overt s/s of aspiration and Min A verbal cues.  SLP Short Term Goal 3 (Week 3): Patient will identify 1 cognitive and 1 physical deficit with Mod A multimodal cues. SLP Short Term Goal 4 (Week 3): Patient will demonstrate sustained attention to functional tasks for ~5 minutes with Max A verbal cues for redirection.  SLP Short Term Goal 5 (Week 3): Patient will orient to place and situation with Mod A question and visual cues.   Skilled Therapeutic Interventions:Skilled therapy intervention focused on cognitive goals. At the start of session patient was independently oriented to day and place; at the end of the session the patient was able to orient to day and place given Min A verbal cues. Patient engaged in a structered card task for 4 intervals of 5 minutes each, sustaining attention to the task given Min-Mod A verbal cues for redirection. Patient handed off to OT at the conclusion of the session. Continue current plan of care.     Function:  Cognition Comprehension Comprehension assist level: Understands basic 25 - 49% of the time/ requires cueing 50 - 75% of the time  Expression   Expression assist level: Expresses basic 50 - 74% of the time/requires cueing 25 - 49% of the time. Needs to repeat parts of sentences.  Social Interaction Social Interaction assist level: Interacts appropriately 25 - 49% of time - Needs frequent redirection.   Problem Solving Problem solving assist level: Solves basic 50 - 74% of the time/requires cueing 25 - 49% of the time  Memory Memory assist level: Recognizes or recalls 25 - 49% of the time/requires cueing 50 - 75% of the time    Pain Pain Assessment Pain Assessment: No/denies pain  Therapy/Group: Individual Therapy  [name] 01/18/2016, 2:50 PM

## 2016-01-18 NOTE — Progress Notes (Signed)
[facility] PHYSICAL MEDICINE & REHABILITATION     PROGRESS NOTE    Subjective/Complaints: No new complaints. Already out of room with his wife. Calves were a little sore yesterday---feel fine today   ROS still somewhat limited due to mental status  Objective: Vital Signs: Blood pressure 111/69, pulse 69, temperature 98.8 F (37.1 C), temperature source Oral, resp. rate 18, weight 76 kg (167 lb 9.6 oz), SpO2 95 %. No results found. No results for input(s): WBC, HGB, HCT, PLT in the last 72 hours. No results for input(s): NA, K, CL, GLUCOSE, BUN, CREATININE, CALCIUM in the last 72 hours.  Invalid input(s): CO CBG (last 3)   Recent Labs  01/16/16 0638  GLUCAP 113*    Wt Readings from Last 3 Encounters:  01/12/16 76 kg (167 lb 9.6 oz)  11/23/15 80 kg (176 lb 5.9 oz)  10/12/15 86.2 kg (190 lb)    Physical Exam:  Constitutional: He appears well-developed. NAD. Eyes: Right eye ptosis.  Conj are normal.  Neck: Normal range of motion. Neck supple.  Cardiovascular: Normal rateand regular rhythm.  Respiratory: Effort normaland breath sounds normal. No respiratory distress.  GI: Soft. Bowel sounds are normal. He exhibits no distension.  PEG tube in place Neurological: He is awake and fairly alert   Oriented to self, place. Less perseverative. more redirectable. Memory improving. Moves all 4's with 5/5 strength Sensation appears to be intact to light touch throughout. DTRs 3+ LLE. Musc: no wrist/forearm pain Skin:  Edges of absorbable suture exposed above skin Psych: Unable to assess to due cognition  Assessment/Plan: 1. 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 Physiatrist is providing close team supervision and 24 hour management of active medical problems listed below. Physiatrist and rehab team continue to assess barriers to  discharge/monitor patient progress toward functional and medical goals.  Function:  Bathing Bathing position Bathing activity did not occur: Refused Position: Production manager parts bathed by patient: Right arm, Left arm, Chest, Abdomen, Front perineal area, Right upper leg, Left upper leg Body parts bathed by helper: Buttocks, Right lower leg, Left lower leg  Bathing assist Assist Level: Supervision or verbal cues      Upper Body Dressing/Undressing Upper body dressing   What is the patient wearing?: Pull over shirt/dress     Pull over shirt/dress - Perfomed by patient: Thread/unthread right sleeve, Thread/unthread left sleeve, Put head through opening, Pull shirt over trunk Pull over shirt/dress - Perfomed by helper: Pull shirt over trunk        Upper body assist Assist Level: Supervision or verbal cues   Set up : To obtain clothing/put away  Lower Body Dressing/Undressing Lower body dressing   What is the patient wearing?: Underwear, Pants, Non-skid slipper socks Underwear - Performed by patient: Thread/unthread right underwear leg, Thread/unthread left underwear leg, Pull underwear up/down Underwear - Performed by helper: Thread/unthread right underwear leg, Thread/unthread left underwear leg Pants- Performed by patient: Thread/unthread right pants leg, Thread/unthread left pants leg, Pull pants up/down Pants- Performed by helper: Thread/unthread right pants leg, Thread/unthread left pants leg Non-skid slipper socks- Performed by patient: Don/doff right sock, Don/doff left sock Non-skid slipper socks- Performed by helper: Don/doff right sock, Don/doff left sock   Socks - Performed by helper: Don/doff right sock, Don/doff left sock Shoes - Performed by patient: Don/doff right shoe, Don/doff left shoe Shoes - Performed by helper:  Fasten right, Fasten left, Don/doff left shoe, Don/doff right shoe       TED Hose - Performed by helper: Don/doff right TED hose,  Don/doff left TED hose  Lower body assist Assist for lower body dressing: Touching or steadying assistance (Pt > 75%)      Toileting Toileting Toileting activity did not occur: No continent bowel/bladder event Toileting steps completed by patient: Adjust clothing prior to toileting, Performs perineal hygiene, Adjust clothing after toileting Toileting steps completed by helper: Performs perineal hygiene, Adjust clothing after toileting Toileting Assistive Devices: Grab bar or rail  Toileting assist Assist level: Touching or steadying assistance (Pt.75%)   Transfers Chair/bed transfer Chair/bed transfer activity did not occur: Safety/medical concerns Chair/bed transfer method: Ambulatory Chair/bed transfer assist level: Touching or steadying assistance (Pt > 75%)       Locomotion Ambulation     Max distance: 175 Assist level: Touching or steadying assistance (Pt > 75%)   Wheelchair   Type: Manual Max wheelchair distance: 100 Assist Level: Supervision or verbal cues  Cognition Comprehension Comprehension assist level: Understands basic 25 - 49% of the time/ requires cueing 50 - 75% of the time  Expression Expression assist level: Expresses basic 50 - 74% of the time/requires cueing 25 - 49% of the time. Needs to repeat parts of sentences.  Social Interaction Social Interaction assist level: Interacts appropriately 25 - 49% of time - Needs frequent redirection.  Problem Solving Problem solving assist level: Solves basic 25 - 49% of the time - needs direction more than half the time to initiate, plan or complete simple activities  Memory Memory assist level: Recognizes or recalls 25 - 49% of the time/requires cueing 50 - 75% of the time   Medical Problem List and Plan: 1. 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 Status post ORIF of multiple facial fractures 12/08/2015  -continue CIR therapies,    -wife has arranged OMFS follow up  at [facility] after discharge from hospital 2. DVT Prophylaxis/Anticoagulation: Subcutaneous Lovenox.   -dopplers negative 3. Pain Management: will use tramadol and tylenol for pain.  4. Mood: Seroquel 12.5 mg twice a day---will stop day time dosing  -ritalin-continue at 10mg  a  5. Neuropsych: This patient is notcapable of making decisions on hisown behalf. 6. Skin/Wound Care: Routine skin checks  -may try to remove retained suture on scalp this week 7. Fluids/Electrolytes/Nutrition: PO reasonable 8.Tracheostomy. Decannulated. Marland Kitchen 9.Dysphagia. Gastrostomy tube 12/03/2015.Site looks good Currently on a dysphagia #1 honey thick liquid diet.  -would stay with soft diet given facial fractures regardless of how much liquids are advanced  -PEG out before discharge? 9.Hypertension. Norvasc 10 mg daily, Coreg 12.5 mg twice a day. Monitor with increased mobility 10.Nondisplaced right radial styloid fracture. Nonweightbearing 11. Comminuted distal left radius fracture Status post closed reduction.    -WBAT bilaterally    12..Urinary retention. Flomax 0.8 mg daily---reduce to 0.4mg .   -still incontinent at night, likely cognition related  --this should improve gradually 13.Constipation. Laxative assistance   LOS (Days) 22 A FACE TO FACE EVALUATION WAS PERFORMED  Jamerion Cabello T 01/18/2016 8:59 AM

## 2016-01-18 NOTE — Progress Notes (Signed)
Speech Language Pathology Daily Session Note  Patient Details  Name: Justin Fox MRN: [id number] Date of Birth: 08-14-1960  Today's Date: 01/18/2016 SLP Individual Time: [id number] SLP Individual Time Calculation (min): 55 min   Short Term Goals: Week 3: SLP Short Term Goal 1 (Week 3): Patient will consume current diet with minimal overt s/s of aspiration with Min A verbal cues for use of swallowing compensatory strategies.  SLP Short Term Goal 2 (Week 3): Patient will utilize multiple swallows during trials of nectar-thick liquids with minimal overt s/s of aspiration and Min A verbal cues.  SLP Short Term Goal 3 (Week 3): Patient will identify 1 cognitive and 1 physical deficit with Mod A multimodal cues. SLP Short Term Goal 4 (Week 3): Patient will demonstrate sustained attention to functional tasks for ~5 minutes with Max A verbal cues for redirection.  SLP Short Term Goal 5 (Week 3): Patient will orient to place and situation with Mod A question and visual cues.   Skilled Therapeutic Interventions: Skilled treatment session focused on addressing cognition and dysphagia goals. SLP facilitated session by providing Total assist instructional cues to recall and lock brakes prior to tranfers or other functional stationary tasks.  Despite multimodal cues clinician was unable to fade to a Max assist level.  SLP also facilitated session with set-up and Max assist verbal cues to consume small single cup sips; attempts to fade cuing to a Mod assist level resulted in increased impulsivity, large sips, and multiple swallows.  While out of his room, patient requested use of orientation signs; however, with Mod question cues patient was able to re-direct himself to accurate month and city (Mod I for year and hospital).  Patient remained restless with frequent requests to go to the bathroom with Max assist verbal cues to increase sustained attention for ~5 minutes which resulted in a continent bowel  movement.  Continue with current plan of care.    Function:  Cognition Comprehension Comprehension assist level: Understands basic 25 - 49% of the time/ requires cueing 50 - 75% of the time  Expression   Expression assist level: Expresses basic 50 - 74% of the time/requires cueing 25 - 49% of the time. Needs to repeat parts of sentences.  Social Interaction Social Interaction assist level: Interacts appropriately 25 - 49% of time - Needs frequent redirection.  Problem Solving Problem solving assist level: Solves basic 25 - 49% of the time - needs direction more than half the time to initiate, plan or complete simple activities  Memory Memory assist level: Recognizes or recalls 25 - 49% of the time/requires cueing 50 - 75% of the time    Pain Pain Assessment Pain Assessment:  (yes, but unable to rate) Pain Type: Acute pain Pain Location: Head Pain Descriptors / Indicators: Aching Pain Onset: On-going Pain Intervention(s): RN made aware Multiple Pain Sites: No  Therapy/Group: Individual Therapy  Carmelia Roller., [place] [id number]  [place] 01/18/2016, 4:33 PM

## 2016-01-18 NOTE — Progress Notes (Signed)
Physical Therapy Note  Patient Details  Name: [name] MRN: [id number] Date of Birth: [birth year] Today's Date: 01/18/2016    Time: 1100-1155 55 minutes  1:1 No c/o pain. Gait throughout unit with min A, occasionally able to fade to close supervision but pt with several LOB requiring min A to correct. Pt able to perform pipe tree with min cuing and choice of 2 parts and cues to self correct.  Pt making bed with min A for balance and delayed balance reactions noted.  Pt engaged in card game "war" with pt able to attend to game and play with supervision for remembering rules and attending to game.   Pt with more confusion of relation to staff members but able to correctly identify all family members and friends from photos.   Iisha Soyars 01/18/2016, 2:36 PM

## 2016-01-18 NOTE — Progress Notes (Signed)
Nutrition Follow-up  DOCUMENTATION CODES:   Non-severe (moderate) malnutrition in context of chronic illness  INTERVENTION:  Continue Ensure Enlive po BID, each supplement provides 350 kcal and 20 grams of protein.  Encourage adequate PO intake.   NUTRITION DIAGNOSIS:   Malnutrition related to dysphagia, catabolic illness, poor appetite as evidenced by percent weight loss, moderate depletions of muscle mass, moderate depletion of body fat; ongoing  GOAL:   Patient will meet greater than or equal to 90% of their needs; met  MONITOR:   PO intake, Supplement acceptance, Labs, Weight trends, Skin, I & O's  REASON FOR ASSESSMENT:   Consult Enteral/tube feeding initiation and management (and Calorie Count)  ASSESSMENT:   55 year old right handed male with history of hypertension and substance abuse. Admitted to [facility] 11/23/2015 after a fall 30 feet from scaffolding question loss of consciousness. Cranial CT scan and imaging demonstrated multiple skull base, frontal sinus, and orbital fractures on the right. He also had a right frontal contusion and associated subdural without midline shift. Complex closure right scalp laceration and left scalp incision. Hospital course tracheostomy and gastrostomy tube placement 12/03/2015. Decannulated 12/21/2015. Underwent ORIF 12/08/2015 for multiple facial fractures with reconstruction. Hospital course pain management. His diet has been advanced to a dysphagia #1 honey thick liquid.   Pt is currently on a dysphagia 1 diet with thin liquids. Meal completion has been varied from 25-100% with most intake at 75-100%. Pt has been consuming his Ensure shakes po and has really been enjoying them per RN. RD to continue with current orders to aid in adequate caloric and protein needs.   Diet Order:  DIET - DYS 1 Room service appropriate? Yes; Fluid consistency: Thin  Skin:  Reviewed, no issues  Last BM:  10/15  Height:   Ht Readings from Last 1  Encounters:  12/27/15 '6\' 1"'$  (1.854 m)    Weight:   Wt Readings from Last 1 Encounters:  01/12/16 167 lb 9.6 oz (76 kg)    Ideal Body Weight:  83.6 kg  BMI:  Body mass index is 22.11 kg/m.  Estimated Nutritional Needs:   Kcal:  4961-1643  Protein:  110-125 grams  Fluid:  2.3-2.5 L/day  EDUCATION NEEDS:   No education needs identified at this time  Corrin Parker, MS, RD, LDN Pager # [phone] After hours/ weekend pager # [phone]

## 2016-01-18 NOTE — Progress Notes (Signed)
Occupational Therapy Session Note  Patient Details  Name: Justin Fox MRN: [id number] Date of Birth: [birth year]  Today's Date: 01/18/2016 OT Individual Time: 1430-1500 OT Individual Time Calculation (min): 30 min     Short Term Goals: Week 3:  OT Short Term Goal 1 (Week 3): Pt will maintain sustained atteniton for 2 mins during functional tasks with min verbal cues to redirect OT Short Term Goal 2 (Week 3): Pt will perform bathing tasks with min A OT Short Term Goal 3 (Week 3): Pt will perform toileting tasks at supervision level OT Short Term Goal 4 (Week 3): Pt will initiate and complete UB and LB dressing tasks with supervision when presented with clothing  Skilled Therapeutic Interventions/Progress Updates:    Treatment session with focus on attention, initiation, and sequencing during familiar card activity.  Pt received in bed reporting fatigue and not wanting to get OOB for this session. Engaged in "War" card activity in chair position in bed with pt able to attend to game and play with supervision for remembering rules and attending to game.  Pt able to attend for periods of 1-1.5 mins before asking for a rest break or what to do next.  Pt correctly identified when therapist's cards were higher, but often incorrectly identified his when they were higher.  Pt reported need to toilet.  Ambulated to bathroom with min guard - min assist.  Pt completed toilet transfer and toileting with min assist and then returned to bed.   Therapy Documentation Precautions:  Precautions Precautions: Fall Restrictions Weight Bearing Restrictions: Yes RUE Weight Bearing: Weight bearing as tolerated LUE Weight Bearing: Weight bearing as tolerated Other Position/Activity Restrictions: pt. has cast on left forearm, splint on right forearm - can remove Right splint General:   Vital Signs: Therapy Vitals Temp: 98.7 F (37.1 C) Temp Source: Oral Pulse Rate: 70 Resp: 18 BP: 117/80 Patient  Position (if appropriate): Lying Oxygen Therapy SpO2: 97 % O2 Device: Not Delivered Pain: Pain Assessment Pain Assessment: No/denies pain  See Function Navigator for Current Functional Status.   Therapy/Group: Individual Therapy  Simonne Come 01/18/2016, 3:26 PM

## 2016-01-19 ENCOUNTER — Inpatient Hospital Stay (HOSPITAL_COMMUNITY): Payer: 59

## 2016-01-19 ENCOUNTER — Inpatient Hospital Stay (HOSPITAL_COMMUNITY): Payer: 59 | Admitting: Speech Pathology

## 2016-01-19 ENCOUNTER — Inpatient Hospital Stay (HOSPITAL_COMMUNITY): Payer: 59 | Admitting: Physical Therapy

## 2016-01-19 NOTE — Progress Notes (Signed)
Occupational Therapy Note  Patient Details  Name: Justin Fox MRN: [id number] Date of Birth: 09-Sep-1960  Today's Date: 01/19/2016 OT Individual Time: [id number] OT Individual Time Calculation (min): 58 min   Pt denied pain Individual Therapy  Pt resting in bed upon arrival.  Pt required max verbal cues to participate in therapy.  Pt repeated "You go ahead and do what you need to do. I am going to snuggle in and take a snooze." Pt finally agreed to participate.  Pt put his hands down his pants and wanted to know what was in his pants (hospital brief crumpled up). Pt wanted to remove and did so.  Pt sat EOB and donned underpants, pants, and shoes at supervision level with max verbal cues to continue to engage in task.  Pt amb with HHA to therapy gym and engaged in ambulatory task in hallway locating numbered disc sequentially.  Pt required max verbal cues to continue with task.  Pt consistently and persistently asks therapist if he is doing ok and "what do you want me to do." Pt returned to bed and sat EOB and engaged in game of Go Fish with min verbal cues to attend to task.  Pt returned to bed with alarm activated and all needs within reach.    [place], [place] [facility] 01/19/2016, 1:53 PM

## 2016-01-19 NOTE — Progress Notes (Signed)
Speech Language Pathology Daily Session Note  Patient Details  Name: Justin Fox MRN: [id number] Date of Birth: [birth year]  Today's Date: 01/19/2016 SLP Individual Time: 1500-1530 SLP Individual Time Calculation (min): 30 min   Short Term Goals: Week 4: SLP Short Term Goal 1 (Week 4): Patient will consume current diet with minimal overt s/s of aspiration with Min A verbal cues for use of swallowing compensatory strategies.  SLP Short Term Goal 2 (Week 4): Patient will demonstrate selective attention to a functional task for ~15 minutes with Min A verbal cues for redirection.  SLP Short Term Goal 3 (Week 4): Patient will demonstrate functional problem solving for basic and familiar tasks with Mod A question and verbal cues.  SLP Short Term Goal 4 (Week 4): Patient will demonstrate orientation to place, time and situation with supervision verbal and visual cues.  SLP Short Term Goal 5 (Week 4): Patient will utilize externall aids to recall new, daily information with Max A multimodal cues.  SLP Short Term Goal 6 (Week 4): Patient will identify 2 physical and 2 cognitive deficits with Mod A question cues.   Skilled Therapeutic Interventions:Skilled therapy intervention focused on cognitive goals and Dys 2 trials. Pt O x self, [facility], Oct 2017 at [facility]. Mod A for city and reason for hospitalization. Pt required max A to comply with swallow precautions due to impulsivity and poor attention. Pt remained pleasant and cooperative throughout the session, but risk for aspiration was increased by pt behavior. No overt s/s aspiration and complete oral clearance achieved with each bite, however therapist controlled bite-size and enforced double swallow, no talking and thourough mastication with each bite.    Function:  Eating Eating   Modified Consistency Diet: Yes Eating Assist Level: Supervision or verbal cues;More than reasonable amount of time;Helper scoops food on utensil;Helper feeds patient    Eating Set Up Assist For: Opening containers Helper Scoops Food on Utensil: Every scoop Helper Brings Food to Mouth: Occasionally   Cognition Comprehension Comprehension assist level: Understands basic 50 - 74% of the time/ requires cueing 25 - 49% of the time  Expression   Expression assist level: Expresses basic 50 - 74% of the time/requires cueing 25 - 49% of the time. Needs to repeat parts of sentences.  Social Interaction Social Interaction assist level: Interacts appropriately 25 - 49% of time - Needs frequent redirection.  Problem Solving Problem solving assist level: Solves basic 25 - 49% of the time - needs direction more than half the time to initiate, plan or complete simple activities  Memory Memory assist level: Recognizes or recalls 25 - 49% of the time/requires cueing 50 - 75% of the time    Pain Pain Assessment Pain Assessment: Faces Faces Pain Scale: No hurt Pain Type: Other (Comment) (Legs sore from PT) Pain Intervention(s): Medication (See eMAR)  Therapy/Group: Individual Therapy  Vinetta Bergamo MA, CCC-SLP 01/19/2016, 4:20 PM

## 2016-01-19 NOTE — Progress Notes (Signed)
Speech Language Pathology Daily Session Note  Patient Details  Name: MANDIP FRAME MRN: [id number] Date of Birth: 09/22/1960  Today's Date: 01/19/2016 SLP Individual Time: 0830-0930 SLP Individual Time Calculation (min): 60 min   Short Term Goals: Week 3: SLP Short Term Goal 1 (Week 3): Patient will consume current diet with minimal overt s/s of aspiration with Min A verbal cues for use of swallowing compensatory strategies.  SLP Short Term Goal 2 (Week 3): Patient will utilize multiple swallows during trials of nectar-thick liquids with minimal overt s/s of aspiration and Min A verbal cues.  SLP Short Term Goal 3 (Week 3): Patient will identify 1 cognitive and 1 physical deficit with Mod A multimodal cues. SLP Short Term Goal 4 (Week 3): Patient will demonstrate sustained attention to functional tasks for ~5 minutes with Max A verbal cues for redirection.  SLP Short Term Goal 5 (Week 3): Patient will orient to place and situation with Mod A question and visual cues.   Skilled Therapeutic Interventions: Skilled therapy intervention focused on cognitive goals. Patient demonstrated 100% accuracy with a basic money counting task given Mod A verbal and visual cues for selective attention and impulsivity with problem solving for a duration of about 10 minutes. Given a structured task for short term recall, patient utilized written external aids in 50% of opportunities given Mod A verbal and visual cues. Patient demonstrated orientation to day, date, month, and place given Min A verbal cues for use of external aids. Patient in wheelchair, handed off to OT. Continue current plan of care.    Function:  Cognition Comprehension Comprehension assist level: Understands basic 50 - 74% of the time/ requires cueing 25 - 49% of the time  Expression   Expression assist level: Expresses basic 50 - 74% of the time/requires cueing 25 - 49% of the time. Needs to repeat parts of sentences.  Social Interaction  Social Interaction assist level: Interacts appropriately 50 - 74% of the time - May be physically or verbally inappropriate.  Problem Solving Problem solving assist level: Solves basic 50 - 74% of the time/requires cueing 25 - 49% of the time  Memory Memory assist level: Recognizes or recalls 50 - 74% of the time/requires cueing 25 - 49% of the time    Pain Pain Assessment Pain Type: Other (Comment) (Legs sore from PT) Pain Intervention(s): Medication (See eMAR)  Therapy/Group: Individual Therapy  [name] 01/19/2016, 3:33 PM

## 2016-01-19 NOTE — Progress Notes (Signed)
Occupational Therapy Session Note  Patient Details  Name: Justin Fox MRN: [id number] Date of Birth: [birth year]  Today's Date: 01/19/2016 OT Individual Time: 0930-1030 OT Individual Time Calculation (min): 60 min     Short Term Goals: Week 3:  OT Short Term Goal 1 (Week 3): Pt will maintain sustained atteniton for 2 mins during functional tasks with min verbal cues to redirect OT Short Term Goal 2 (Week 3): Pt will perform bathing tasks with min A OT Short Term Goal 3 (Week 3): Pt will perform toileting tasks at supervision level OT Short Term Goal 4 (Week 3): Pt will initiate and complete UB and LB dressing tasks with supervision when presented with clothing  Skilled Therapeutic Interventions/Progress Updates:    Pt resting in bed upon arrival.  Pt initially engaged in using toilet to void with emphasis on functional amb with HHA, standing balance, task initiation, and sequencing.  Pt amb with HHA to gym and engaged in replicating pipe tree structure from diagram.  Pt required max verbal cues to select correct piping.  Pt transitioned to replicating peg board design from pictures.  Pt completed tasks with max encouragement regarding performance.  Pt continually seeks approval and recognition of his performance during therapies.  Pt transitioned to [employer] and engaged in game of War with playing cards.  Pt performs task with verbal cues to ascertain who won each hand.  Pt requested to use toilet and amb with HHA to room to use toilet at steady A level.  Pt attended to task during game of War for approx 5 mins with min verbal cues.  Pt returned to bed with all needs within reach and bed alarm activated.  Focus on task initiation, sequencing, attention to task, safety awareness, and cognitive remediation to increase independence with BADLs.  Therapy Documentation Precautions:  Precautions Precautions: Fall Restrictions Weight Bearing Restrictions: Yes RUE Weight Bearing: Weight bearing as  tolerated LUE Weight Bearing: Weight bearing as tolerated Other Position/Activity Restrictions: pt. has cast on left forearm, splint on right forearm - can remove Right splint General:   Vital Signs:   Pain: Pain Assessment Pain Assessment: 0-10 Pain Score: 4  Pain Type: Acute pain Pain Location: Leg Pain Orientation: Left Pain Descriptors / Indicators: Aching Pain Frequency: Occasional Pain Onset: On-going Patients Stated Pain Goal: 2 Pain Intervention(s): Medication (See eMAR) Multiple Pain Sites: No ADL:   Exercises:   Other Treatments:    See Function Navigator for Current Functional Status.   Therapy/Group: Individual Therapy  Royal, Kwiecien 01/19/2016, 11:26 AM

## 2016-01-19 NOTE — Progress Notes (Signed)
Speech Language Pathology Weekly Progress Note  Patient Details  Name: Justin Fox MRN: [id number] Date of Birth: 15-Feb-1961  Beginning of progress report period: January 12, 2016 End of progress report period: January 19, 2016  Today's Date: 01/19/2016   Short Term Goals: Week 3: SLP Short Term Goal 1 (Week 3): Patient will consume current diet with minimal overt s/s of aspiration with Min A verbal cues for use of swallowing compensatory strategies.  SLP Short Term Goal 1 - Progress (Week 3): Not met SLP Short Term Goal 2 (Week 3): Patient will utilize multiple swallows during trials of nectar-thick liquids with minimal overt s/s of aspiration and Min A verbal cues.  SLP Short Term Goal 2 - Progress (Week 3): Met SLP Short Term Goal 3 (Week 3): Patient will identify 1 cognitive and 1 physical deficit with Mod A multimodal cues. SLP Short Term Goal 3 - Progress (Week 3): Met SLP Short Term Goal 4 (Week 3): Patient will demonstrate sustained attention to functional tasks for ~5 minutes with Max A verbal cues for redirection.  SLP Short Term Goal 4 - Progress (Week 3): Met SLP Short Term Goal 5 (Week 3): Patient will orient to place and situation with Mod A question and visual cues.  SLP Short Term Goal 5 - Progress (Week 3): Met    New Short Term Goals: Week 4: SLP Short Term Goal 1 (Week 4): Patient will consume current diet with minimal overt s/s of aspiration with Min A verbal cues for use of swallowing compensatory strategies.  SLP Short Term Goal 2 (Week 4): Patient will demonstrate selective attention to a functional task for ~15 minutes with Min A verbal cues for redirection.  SLP Short Term Goal 3 (Week 4): Patient will demonstrate functional problem solving for basic and familiar tasks with Mod A question and verbal cues.  SLP Short Term Goal 4 (Week 4): Patient will demonstrate orientation to place, time and situation with supervision verbal and visual cues.  SLP Short Term  Goal 5 (Week 4): Patient will utilize externall aids to recall new, daily information with Max A multimodal cues.  SLP Short Term Goal 6 (Week 4): Patient will identify 2 physical and 2 cognitive deficits with Mod A question cues.   Weekly Progress Updates: Patient has made functional gains and has met 4of 5 STG's this reporting period due to improved cognitive and swallowing function. Currently, patient is consuming Dys. 1 textures with thin liquids with minimal overt s/s of aspiration and requires Mod A verbal cues for use of swallowing compensatory strategies. Patient is also consuming trials of Dys. 2 textures but continues to be limited by cognitive impairments. Currently, patient is demonstrating behaviors consistent with a Rancho Level VI and requires overall Mod-Max A verbal cues to complete functional and familiar tasks safely in regards to attention, problem solving, recall and awareness. Patient also demonstrates intermittent  impulsivity with functional tasks. Patient and family education is ongoing. Patient would benefit from continued skilled SLP intervention to maximize his swallowing and cognitive function and overall functional independence prior to discharge.   Intensity: Minumum of 1-2 x/day, 30 to 90 minutes Frequency: 3 to 5 out of 7 days Duration/Length of Stay: 01/25/16 Treatment/Interventions: Cognitive remediation/compensation;Cueing hierarchy;Functional tasks;Patient/family education;Dysphagia/aspiration precaution training;Internal/external aids;Speech/Language facilitation;Environmental controls;Therapeutic Activities     [place], [address] 01/19/2016, 3:40 PM

## 2016-01-19 NOTE — Progress Notes (Signed)
[place] PHYSICAL MEDICINE & REHABILITATION     PROGRESS NOTE    Subjective/Complaints: No new complaints. Already out of room with his wife. Calves were a little sore yesterday---feel fine today   ROS still somewhat limited due to mental status  Objective: Vital Signs: Blood pressure 114/77, pulse 64, temperature 98.2 F (36.8 C), temperature source Oral, resp. rate 18, weight 74 kg (163 lb 3.2 oz), SpO2 98 %. No results found. No results for input(s): WBC, HGB, HCT, PLT in the last 72 hours. No results for input(s): NA, K, CL, GLUCOSE, BUN, CREATININE, CALCIUM in the last 72 hours.  Invalid input(s): CO CBG (last 3)  No results for input(s): GLUCAP in the last 72 hours.  Wt Readings from Last 3 Encounters:  01/19/16 74 kg (163 lb 3.2 oz)  11/23/15 80 kg (176 lb 5.9 oz)  10/12/15 86.2 kg (190 lb)    Physical Exam:  Constitutional: He appears well-developed. NAD. Eyes: Right eye ptosis.  Conj are normal.  Neck: Normal range of motion. Neck supple.  Cardiovascular: Normal rateand regular rhythm.  Respiratory: Effort normaland breath sounds normal. No respiratory distress.  GI: Soft. Bowel sounds are normal. He exhibits no distension.  PEG tube in place Neurological: He is awake and fairly alert   Oriented to self, place. Less perseverative. more redirectable. Memory improving. Moves all 4's with 5/5 strength Sensation appears to be intact to light touch throughout. DTRs 3+ LLE. Musc: no wrist/forearm pain Skin:  Edges of absorbable suture exposed above skin Psych: Unable to assess to due cognition  Assessment/Plan: 1. 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 Physiatrist is providing close team supervision and 24 hour management of active medical problems listed below. Physiatrist and rehab team continue to assess barriers  to discharge/monitor patient progress toward functional and medical goals.  Function:  Bathing Bathing position Bathing activity did not occur: Refused Position: Production manager parts bathed by patient: Right arm, Left arm, Chest, Abdomen, Front perineal area, Right upper leg, Left upper leg Body parts bathed by helper: Buttocks, Right lower leg, Left lower leg  Bathing assist Assist Level: Supervision or verbal cues      Upper Body Dressing/Undressing Upper body dressing   What is the patient wearing?: Pull over shirt/dress     Pull over shirt/dress - Perfomed by patient: Thread/unthread right sleeve, Thread/unthread left sleeve, Put head through opening, Pull shirt over trunk Pull over shirt/dress - Perfomed by helper: Pull shirt over trunk        Upper body assist Assist Level: Supervision or verbal cues   Set up : To obtain clothing/put away  Lower Body Dressing/Undressing Lower body dressing   What is the patient wearing?: Underwear, Pants, Non-skid slipper socks Underwear - Performed by patient: Thread/unthread right underwear leg, Thread/unthread left underwear leg, Pull underwear up/down Underwear - Performed by helper: Thread/unthread right underwear leg, Thread/unthread left underwear leg Pants- Performed by patient: Thread/unthread right pants leg, Thread/unthread left pants leg, Pull pants up/down Pants- Performed by helper: Thread/unthread right pants leg, Thread/unthread left pants leg Non-skid slipper socks- Performed by patient: Don/doff right sock, Don/doff left sock Non-skid slipper socks- Performed by helper: Don/doff right sock, Don/doff left sock   Socks - Performed by helper: Don/doff right sock, Don/doff left sock Shoes - Performed by patient: Don/doff right shoe, Don/doff left shoe Shoes - Performed by helper: Fasten  right, Fasten left, Don/doff left shoe, Don/doff right shoe       TED Hose - Performed by helper: Don/doff right TED hose,  Don/doff left TED hose  Lower body assist Assist for lower body dressing: Touching or steadying assistance (Pt > 75%)      Toileting Toileting Toileting activity did not occur: No continent bowel/bladder event Toileting steps completed by patient: Adjust clothing prior to toileting, Performs perineal hygiene, Adjust clothing after toileting Toileting steps completed by helper: Performs perineal hygiene, Adjust clothing after toileting Toileting Assistive Devices: Grab bar or rail  Toileting assist Assist level: Touching or steadying assistance (Pt.75%)   Transfers Chair/bed transfer Chair/bed transfer activity did not occur: Safety/medical concerns Chair/bed transfer method: Ambulatory Chair/bed transfer assist level: Touching or steadying assistance (Pt > 75%)       Locomotion Ambulation     Max distance: 175 Assist level: Touching or steadying assistance (Pt > 75%)   Wheelchair   Type: Manual Max wheelchair distance: 100 Assist Level: Supervision or verbal cues  Cognition Comprehension Comprehension assist level: Understands basic 25 - 49% of the time/ requires cueing 50 - 75% of the time  Expression Expression assist level: Expresses basic 50 - 74% of the time/requires cueing 25 - 49% of the time. Needs to repeat parts of sentences.  Social Interaction Social Interaction assist level: Interacts appropriately 25 - 49% of time - Needs frequent redirection.  Problem Solving Problem solving assist level: Solves basic 25 - 49% of the time - needs direction more than half the time to initiate, plan or complete simple activities  Memory Memory assist level: Recognizes or recalls 25 - 49% of the time/requires cueing 50 - 75% of the time   Medical Problem List and Plan: 1. 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 Status post ORIF of multiple facial fractures 12/08/2015  -continue CIR therapies,    -wife has arranged OMFS follow up  at [facility] after discharge from hospital 2. DVT Prophylaxis/Anticoagulation: Subcutaneous Lovenox.   -dopplers negative 3. Pain Management: will use tramadol and tylenol for pain.  4. Mood: Seroquel 12.5 mg twice a day---will stop day time dosing  -ritalin-continue at 10mg  a  5. Neuropsych: This patient is notcapable of making decisions on hisown behalf. 6. Skin/Wound Care: Routine skin checks  -may try to remove retained suture on scalp this week 7. Fluids/Electrolytes/Nutrition: PO reasonable 8.Tracheostomy. Decannulated. Marland Kitchen 9.Dysphagia. Gastrostomy tube 12/03/2015.Site looks good Currently on a dysphagia #1 honey thick liquid diet.  -would stay with soft diet given facial fractures regardless of how much liquids are advanced  -will remove PEG in the AM 9.Hypertension. Norvasc 10 mg daily, Coreg 12.5 mg twice a day. Monitor with increased mobility 10.Nondisplaced right radial styloid fracture. Nonweightbearing 11. Comminuted distal left radius fracture Status post closed reduction.    -WBAT bilaterally    12..Urinary retention. Flomax 0.8 mg daily---reduced to 0.4mg .   -still incontinent at night, likely cognition related    13.Constipation. Laxative assistance   LOS (Days) 23 A FACE TO FACE EVALUATION WAS PERFORMED  SWARTZ,ZACHARY T 01/19/2016 12:06 PM

## 2016-01-19 NOTE — Progress Notes (Signed)
Physical Therapy Session Note  Patient Details  Name: Justin Fox MRN: [id number] Date of Birth: 05/26/1960  Today's Date: 01/19/2016 PT Individual Time: 1101-1200 PT Individual Time Calculation (min): 59 min    Skilled Therapeutic Interventions/Progress Updates:    Pt received in bed noting R side facial pain & later during session BLE pain but RN reports pt is premedicated. Throughout session pt ambulated around unit with min assist due to inconsistent step length BLE & lateral sway. Pt negotiated 12 steps (6" + 3") + 4 steps (4") with B rails & steady assist; pt negotiated single curb x 2 trials with min assist & no AD. Pt completed car transfer from elevated seat height with min assist for balance as pt stepped into car. Pt performed floor transfer with steady assist and transferred on/off low soft couch with steady assist. Pt participated in card game requiring him to obtain cards of one color with max cuing to follow instructions but min/mod cuing to attend to task. Then attempted to have pt obtain 3 red cards + 3 black cards but pt unable to follow instructions even with max cuing. Pt able to sort cards by suit with less cuing on this date. Pt demonstrates longer duration of sustained attention (~1 minute) with less cuing (mod assist). Pt continues to perseverate on need to use restroom; attempted twice & pt able to void on one occasion. Pt able to path find to room with min cuing for correct room number; pt able to recall that he is in [facility]. Continued to educate pt on need to participate in therapy to get stronger & go home. At end of session pt left in bed with all needs within reach & alarm set.   Therapy Documentation Precautions:  Precautions Precautions: Fall Restrictions Weight Bearing Restrictions: Yes RUE Weight Bearing: Weight bearing as tolerated LUE Weight Bearing: Weight bearing as tolerated Other Position/Activity Restrictions: pt. has cast on left  forearm, splint on right forearm - can remove Right splint   See Function Navigator for Current Functional Status.   Therapy/Group: Individual Therapy  Justin Fox 01/19/2016, 12:19 PM

## 2016-01-20 ENCOUNTER — Inpatient Hospital Stay (HOSPITAL_COMMUNITY): Payer: 59 | Admitting: Physical Therapy

## 2016-01-20 ENCOUNTER — Inpatient Hospital Stay (HOSPITAL_COMMUNITY): Payer: 59

## 2016-01-20 ENCOUNTER — Inpatient Hospital Stay (HOSPITAL_COMMUNITY): Payer: 59 | Admitting: Speech Pathology

## 2016-01-20 ENCOUNTER — Inpatient Hospital Stay (HOSPITAL_COMMUNITY): Payer: 59 | Admitting: Occupational Therapy

## 2016-01-20 NOTE — Progress Notes (Signed)
Physical Therapy Session Note  Patient Details  Name: Justin Fox MRN: [id number] Date of Birth: 08-06-1960  Today's Date: 01/20/2016 PT Individual Time: 1450-1518 PT Individual Time Calculation (min): 28 min   Skilled Therapeutic Interventions/Progress Updates:    Session focused on addressing overall gait, balance, attention, and endurance. Pt able to gait on unit without AD, up/down ramp x 2 reps, curb step negotiation, mulched surface gait to simulate community environment, and through obstacle course and on compliant surface with overall supervision to min assist and verbal cues for attention, safety, and awareness. End of session returned back to bed with bed alarm intact.   Therapy Documentation Precautions:  Precautions Precautions: Fall Restrictions Weight Bearing Restrictions: No RUE Weight Bearing: Weight bearing as tolerated LUE Weight Bearing: Weight bearing as tolerated RLE Weight Bearing: Weight bearing as tolerated LLE Weight Bearing: Weight bearing as tolerated  Pain:  No complaints of pain.  See Function Navigator for Current Functional Status.   Therapy/Group: Individual Therapy  Canary Brim Ivory Broad, PT, DPT  01/20/2016, 3:25 PM

## 2016-01-20 NOTE — Progress Notes (Signed)
Occupational Therapy Session Note  Patient Details  Name: Justin Fox MRN: [id number] Date of Birth: 03-20-1961  Today's Date: 01/20/2016 OT Individual Time: [id number] OT Individual Time Calculation (min): 55 min     Short Term Goals: Week 4:  OT Short Term Goal 1 (Week 4): STG=LTG secondarty to ELOS  Skilled Therapeutic Interventions/Progress Updates:    Pt resting in w/c upon arrival with wife present.  Pt propelled w/c to therapy gym and initially engaged in [place] thered on [employer] to increase activity tolerance.  Pt completed 3 mins X 2 on level 2 with mod encouragement and min verbal cues to attend to task.  Pt stated he needed to use rest room and amb with HHA to bathroom.  Pt completed toileting tasks and toileting tasks at supervision level.  Pt amb with HHA to day room and participated in game of War with playing cards.  Pt completed game without any encouragement to complete game.  Pt amb with HHA to therapy gym and engaged in standing balance tasks with mod encouragement to continue with task.  Pt required tot A to recall his room and max verbal cues to use signage to locate his room.  Pt returned to bed with alarm activated and all needs within reach.  Focus on activity tolerance, task initiation, sequencing, attention to task, active participation, orientation, and safety awareness to increase independence with BADLs.  Therapy Documentation Precautions:  Precautions Precautions: Fall Restrictions Weight Bearing Restrictions: No RUE Weight Bearing: Weight bearing as tolerated LUE Weight Bearing: Weight bearing as tolerated RLE Weight Bearing: Weight bearing as tolerated LLE Weight Bearing: Weight bearing as tolerated Other Position/Activity Restrictions: pt. has cast on left forearm, splint on right forearm - can remove Right splint Pain:  Pt denies pain  See Function Navigator for Current Functional Status.   Therapy/Group: Individual Therapy  Kace, [place] 01/20/2016, 8:56 AM

## 2016-01-20 NOTE — Progress Notes (Signed)
Occupational Therapy Weekly Progress Note  Patient Details  Name: Justin Fox MRN: [id number] Date of Birth: Dec 14, 1960  Beginning of progress report period: January 13, 2016 End of progress report period: January 20, 2016  Patient has met 3 of 4 short term goals.  Pt made steady progress during the past week.  Pt is able to maintain sustained attention for approx 3 mins during functional tasks with min verbal cues for redirect and max encouragement.  Pt continues to exhibit reluctance with bathing tasks and requires increased assistance for thoroughness.  Pt is able to complete dressing tasks at supervision level but requires max encouragement to engaged in task.  Pt continues to requires max encouragement to participate in therapy but does not physically resist. Pt's wife and daughter have been present and participated in therapy session.  Pt exhibits behaviors consistent with Rancho Level VI.  Patient continues to demonstrate the following deficits: decreased activity tolerance, decreased initiation, decreased attention, decreased awareness, decreased problem solving, decreased safety awareness, decreased memory and delayed processing, decreased standing balance, and decreased balance strategies and therefore will continue to benefit from skilled OT intervention to enhance overall performance with BADL and Reduce care partner burden.  Patient progressing toward long term goals..  Continue plan of care.  OT Short Term Goals Week 3:  OT Short Term Goal 1 (Week 3): Pt will maintain sustained atteniton for 2 mins during functional tasks with min verbal cues to redirect OT Short Term Goal 1 - Progress (Week 3): Met OT Short Term Goal 2 (Week 3): Pt will perform bathing tasks with min A OT Short Term Goal 2 - Progress (Week 3): Progressing toward goal OT Short Term Goal 3 (Week 3): Pt will perform toileting tasks at supervision level OT Short Term Goal 3 - Progress (Week 3): Met OT Short Term  Goal 4 (Week 3): Pt will initiate and complete UB and LB dressing tasks with supervision when presented with clothing OT Short Term Goal 4 - Progress (Week 3): Met Week 4:  OT Short Term Goal 1 (Week 4): STG=LTG secondarty to ELOS  Therapy Documentation Precautions:  Precautions Precautions: Fall Restrictions Weight Bearing Restrictions: No RUE Weight Bearing: Weight bearing as tolerated LUE Weight Bearing: Weight bearing as tolerated RLE Weight Bearing: Weight bearing as tolerated LLE Weight Bearing: Weight bearing as tolerated Other Position/Activity Restrictions: pt. has cast on left forearm, splint on right forearm - can remove Right splint   See Function Navigator for Current Functional Status.    Abdalrahman, [place] [facility] 01/20/2016, 6:58 AM

## 2016-01-20 NOTE — Progress Notes (Signed)
Physical Therapy Note  Patient Details  Name: Justin Fox MRN: [id number] Date of Birth: 09-11-1960 Today's Date: 01/20/2016    Time: 1100-1157 57 minutes  1:1 No c/o pain.  Gait outside up to 150' before fatigued. Min A for gait on uneven surfaces, occasional mod A for LOB posterior with ascending ramps and outdoor stairs with 1 handrail. Pt transferred from sitting on curb and low bench with min A. Supine and tall kneeling exercises with pt able to perform up to 10 reps with mod cuing for attention and completion of tasks.  Kinetron in sitting 4 x 1 minute with mod/max cuing to continue task. Pt continues with decreased attention and awareness but is improving balance and memory. Pt's daughter present throughout session and able to give good cues for attention and orientation.   Shellene Sweigert 01/20/2016, 1:51 PM
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
## 2016-01-20 NOTE — Progress Notes (Signed)
Speech Language Pathology Daily Session Note  Patient Details  Name: Justin Fox MRN: [id number] Date of Birth: Oct 09, 1960  Today's Date: 01/20/2016 SLP Individual Time: 0930-1030 SLP Individual Time Calculation (min): 60 min   Short Term Goals: Week 4: SLP Short Term Goal 1 (Week 4): Patient will consume current diet with minimal overt s/s of aspiration with Min A verbal cues for use of swallowing compensatory strategies.  SLP Short Term Goal 2 (Week 4): Patient will demonstrate selective attention to a functional task for ~15 minutes with Min A verbal cues for redirection.  SLP Short Term Goal 3 (Week 4): Patient will demonstrate functional problem solving for basic and familiar tasks with Mod A question and verbal cues.  SLP Short Term Goal 4 (Week 4): Patient will demonstrate orientation to place, time and situation with supervision verbal and visual cues.  SLP Short Term Goal 5 (Week 4): Patient will utilize externall aids to recall new, daily information with Max A multimodal cues.  SLP Short Term Goal 6 (Week 4): Patient will identify 2 physical and 2 cognitive deficits with Mod A question cues.   Skilled Therapeutic Interventions: Skilled treatment session focused on dysphagia and cognitive goals. SLP facilitated session by providing Max A verbal and tactile cues for portion control and utilization of swallowing compensatory strategies with thin liquids via cup which resulted in an intermittent wet vocal quality that cleared with cued throat clears. Patient perseverative on using the bathroom and was able to successful void X 2 but unable to have a bowel movement, RN made aware. Patient participated in a mildly complex card task and initially required Max A verbal cues for problem solving, however, by end of task, the patient only required Min-Mod A question cues. Patient demonstrated selective attention to task for ~7 minutes in a mildly distracting environment with Mod A verbal cues  for redirection. Patient left upright in wheelchair with daughter present. Continue with current plan of care.    Function:  Eating Eating   Modified Consistency Diet: Yes Eating Assist Level: Supervision or verbal cues;More than reasonable amount of time;Help managing cup/glass   Eating Set Up Assist For: Opening containers       Cognition Comprehension Comprehension assist level: Understands basic 50 - 74% of the time/ requires cueing 25 - 49% of the time  Expression   Expression assist level: Expresses basic 50 - 74% of the time/requires cueing 25 - 49% of the time. Needs to repeat parts of sentences.  Social Interaction Social Interaction assist level: Interacts appropriately 25 - 49% of time - Needs frequent redirection.  Problem Solving Problem solving assist level: Solves basic 25 - 49% of the time - needs direction more than half the time to initiate, plan or complete simple activities  Memory Memory assist level: Recognizes or recalls 25 - 49% of the time/requires cueing 50 - 75% of the time    Pain No/Denies Pain   Therapy/Group: Individual Therapy  Elanda Garmany 01/20/2016, 3:35 PM

## 2016-01-20 NOTE — Progress Notes (Signed)
Social Work Patient ID: Maalik R Mccarthy, male   DOB: 06/14/1960, 55 y.o.   MRN: [id number]   CSW met with pt and dtr and talked with wife via telephone to update them on team conference discussion.  Pt is on track for d/c on 01-25-16 with after lunch d/c likely per wife.  Pt has progressed, but slowly.  Wife has seen improvements and is pleased with where pt is and is encouraged, yet realistic.  Team is recommending HH to begin for therapies at home and then progress to outpt.  Wife and dtr agree and do not have a preference for agencies. CSW will order any needed DME, although pt will likely not need anything.  Wife plans to get a handheld shower head.  CSW will include brain injury support group information in pt's d/c paperwork and provide other support resources.  Family has been trained thoroughly and feels their questions have been answered and do not feel they need another sit-down meeting with the team.  Team also feels family is prepared to care for pt.  CSW will continue to follow pt/family and remain available for d/c planning, resource distribution, and any other needs.  

## 2016-01-21 ENCOUNTER — Inpatient Hospital Stay (HOSPITAL_COMMUNITY): Payer: 59 | Admitting: Speech Pathology

## 2016-01-21 ENCOUNTER — Inpatient Hospital Stay (HOSPITAL_COMMUNITY): Payer: 59 | Admitting: Physical Therapy

## 2016-01-21 ENCOUNTER — Inpatient Hospital Stay (HOSPITAL_COMMUNITY): Payer: 59

## 2016-01-21 ENCOUNTER — Inpatient Hospital Stay (HOSPITAL_COMMUNITY): Payer: 59 | Admitting: Occupational Therapy

## 2016-01-21 NOTE — Progress Notes (Signed)
Occupational Therapy Session Note  Patient Details  Name: Justin Fox MRN: [id number] Date of Birth: 11-03-1960  Today's Date: 01/21/2016 OT Individual Time: 1610-1700 OT Individual Time Calculation (min): 50 min     Short Term Goals: Week 3:  OT Short Term Goal 1 (Week 3): Pt will maintain sustained atteniton for 2 mins during functional tasks with min verbal cues to redirect OT Short Term Goal 1 - Progress (Week 3): Met OT Short Term Goal 2 (Week 3): Pt will perform bathing tasks with min A OT Short Term Goal 2 - Progress (Week 3): Progressing toward goal OT Short Term Goal 3 (Week 3): Pt will perform toileting tasks at supervision level OT Short Term Goal 3 - Progress (Week 3): Met OT Short Term Goal 4 (Week 3): Pt will initiate and complete UB and LB dressing tasks with supervision when presented with clothing OT Short Term Goal 4 - Progress (Week 3): Met  Skilled Therapeutic Interventions/Progress Updates:   Patient was lying in bed and initially refused; however, his wife Noell adamantly and politely prompted him to get OOB and kept hiim engaged though he constantly stated he wanted to go back to bed.  He stood up at toilet to void with CGA assist for the transfer and with close supervision for toileting tasks.  He walked from room to gym with CGA to not "trail off to his right."  He participated in Nu Step resistance 4-7 for 3 minutes with four 15 second rest breaks.   At times he only worked his feet.  As well, he sat to shoot the basketball into the hoop.   And finally he tossed a large bunch of soft socks back and forth to his wife and this clniician.  He required constant cueing to focus, attend and participate.    He did become a little agitated but allowed time to rest and process.   He was able to return to a non agitated state.   He did appear fatigued as evidenced by at times closing his eyes, stopping all therapies constantly to rest, and after getting into bed  closing his eyes as if he was falling asleep.  He was assisted back to bed with call bell and bed alarm in place.  Wife exited the room to go home as this clinician left patient's room.   Therapy Documentation Precautions:  Precautions Precautions: Fall Restrictions Weight Bearing Restrictions: Yes RUE Weight Bearing: Non weight bearing LUE Weight Bearing: Weight bearing as tolerated RLE Weight Bearing: Weight bearing as tolerated LLE Weight Bearing: Weight bearing as tolerated Other Position/Activity Restrictions: pt. has cast on left forearm, splint on right forearm - can remove Right splint   Pain: Pain Assessment Pain Assessment: No/denies pain See Function Navigator for Current Functional Status.   Therapy/Group: Individual Therapy  Herschell Dimes 01/21/2016, 7:23 PM

## 2016-01-21 NOTE — Progress Notes (Signed)
Speech Language Pathology Daily Session Note  Patient Details  Name: Justin Fox MRN: [id number] Date of Birth: Aug 15, 1960  Today's Date: 01/21/2016 SLP Individual Time: 0930-1030 SLP Individual Time Calculation (min): 60 min   Short Term Goals: Week 4: SLP Short Term Goal 1 (Week 4): Patient will consume current diet with minimal overt s/s of aspiration with Min A verbal cues for use of swallowing compensatory strategies.  SLP Short Term Goal 2 (Week 4): Patient will demonstrate selective attention to a functional task for ~15 minutes with Min A verbal cues for redirection.  SLP Short Term Goal 3 (Week 4): Patient will demonstrate functional problem solving for basic and familiar tasks with Mod A question and verbal cues.  SLP Short Term Goal 4 (Week 4): Patient will demonstrate orientation to place, time and situation with supervision verbal and visual cues.  SLP Short Term Goal 5 (Week 4): Patient will utilize externall aids to recall new, daily information with Max A multimodal cues.  SLP Short Term Goal 6 (Week 4): Patient will identify 2 physical and 2 cognitive deficits with Mod A question cues.   Skilled Therapeutic Interventions: Skilled treatment session focused on dysphagia and cognitive goals. SLP facilitated session by providing trials of Dys. 2 textures. Patient demonstrated efficient mastication and required Mod A verbal cues for use of small bites, a slow rate and multiple swallows. Recommend trial tray prior to upgrade. Patient also consumed thin liquids throughout session with intermittent overt s/s of aspiration and Max A verbal cues needed for small sips. Patient participated in a navigation task and required Mod-Max A verbal and question cues for problem solving with task. Patient was independently oriented to place and month and required total A for orientation to situation. Patient's daughter present throughout session and provided appropriate cueing. Patient left upright  in wheelchair with daughter present. Continue with current plan of care.    Of note, patient observed with lunch meal of Dys. 2 textures and required Max-total A for use of swallowing compensatory strategies with an intermittent wet vocal quality. Recommend patient downgrade back to Dys. 1 textures to maximize overall safety. Patient's family in agreement.   Function:  Eating Eating     Eating Assist Level: Supervision or verbal cues           Cognition Comprehension Comprehension assist level: Understands basic 50 - 74% of the time/ requires cueing 25 - 49% of the time  Expression   Expression assist level: Expresses basic 50 - 74% of the time/requires cueing 25 - 49% of the time. Needs to repeat parts of sentences.  Social Interaction Social Interaction assist level: Interacts appropriately 25 - 49% of time - Needs frequent redirection.  Problem Solving Problem solving assist level: Solves basic 25 - 49% of the time - needs direction more than half the time to initiate, plan or complete simple activities  Memory Memory assist level: Recognizes or recalls 25 - 49% of the time/requires cueing 50 - 75% of the time    Pain Pain Assessment Pain Assessment: 0-10 Pain Score: 7  Pain Location: Leg Pain Orientation: Right;Left Pain Descriptors / Indicators: Aching Pain Frequency: Intermittent Pain Onset: On-going Patients Stated Pain Goal: 2 Pain Intervention(s): Medication (See eMAR) Multiple Pain Sites: No  Therapy/Group: Individual Therapy  Justin Fox 01/21/2016, 12:39 PM

## 2016-01-21 NOTE — Progress Notes (Signed)
Physical Therapy Session Note  Patient Details  Name: Justin Fox MRN: [id number] Date of Birth: Aug 16, 1960  Today's Date: 01/21/2016 PT Individual Time: 0413-6438 PT Individual Time Calculation (min): 25 min    Short Term Goals: Week 3:  PT Short Term Goal 1 (Week 3): Pt will ambulate 50 ft with supervision assist. PT Short Term Goal 1 - Progress (Week 3): Met PT Short Term Goal 2 (Week 3): Pt will negotiate 12 steps with min assist & B rails. PT Short Term Goal 2 - Progress (Week 3): Met PT Short Term Goal 3 (Week 3): Pt will negotiate single step without rails & mod assist. PT Short Term Goal 3 - Progress (Week 3): Met PT Short Term Goal 4 (Week 3): Pt will sustain attention to functional task for 30 seconds with minimal cuing.  PT Short Term Goal 4 - Progress (Week 3): Progressing toward goal  Skilled Therapeutic Interventions/Progress Updates:     Patient received supine and agreeable to PT treatment. Session focused on attention, sequential processing and navigation of familiar environment. Gait to ortho gym with min Assist and mod cues for direction and to remember target locatio. PT instructed patient in Locating day room then rehab gym with mod cues to remain on task and to utilize signs in hall for indicators.PT instructed patient in finding 8 targets in sequential order placed randomly in rehab hall. Following rest break, Patient able to locate room with mod cues for proper room number.  Pt Left supine in bed with call bell in reach and bed alarm on following bed mobility without assist from PT.    Therapy Documentation Precautions:  Precautions Precautions: Fall Restrictions Weight Bearing Restrictions: Yes RUE Weight Bearing: Non weight bearing LUE Weight Bearing: Weight bearing as tolerated RLE Weight Bearing: Weight bearing as tolerated LLE Weight Bearing: Weight bearing as tolerated Other Position/Activity Restrictions: pt. has cast on left forearm, splint on  right forearm - can remove Right splint General:   Vital Signs:   Pain:  denies; 0/10   See Function Navigator for Current Functional Status.   Therapy/Group: Individual Therapy  Lorie Phenix 01/21/2016, 1:57 PM

## 2016-01-21 NOTE — Progress Notes (Signed)
Physical Therapy Note  Patient Details  Name: Justin Fox MRN: [id number] Date of Birth: [birth year] Today's Date: 01/21/2016    Time: 1035-1130 55 minutes  1:1 No c/o pain. Pt c/o fatigue throughout session, required increased encouragement to participate in sitting and standing tasks.  Pt performed bean bag toss from seated position with focus on pt using mental math to add score. Pt able to add scores of 3-4 numbers with min cuing for correcting mistakes.  Attempt Wii bowling, pt with difficulty coordinating and sequencing use of the wii remote, increasingly became more frustrated, able to redirect and have pt continue therapy with a different task. Pt participated in shooting basketball x 10 shots from seated position with encouragement. Pt then lays down and states "this is all too much today"  Engaged pt in making a daily schedule for home. Pt and his daughter were able to come up with a daily schedule that they can use after  D/c next week. Pt able to gait 400' at end of session and find room with min cuing.   [name] 01/21/2016, 11:44 AM

## 2016-01-21 NOTE — Progress Notes (Signed)
[facility] PHYSICAL MEDICINE & REHABILITATION     PROGRESS NOTE    Subjective/Complaints: Slept well. Continent of urine last night  ROS still somewhat limited due to mental status  Objective: Vital Signs: Blood pressure 110/77, pulse 73, temperature 99.3 F (37.4 C), temperature source Oral, resp. rate 17, weight 74 kg (163 lb 3.2 oz), SpO2 97 %. No results found. No results for input(s): WBC, HGB, HCT, PLT in the last 72 hours. No results for input(s): NA, K, CL, GLUCOSE, BUN, CREATININE, CALCIUM in the last 72 hours.  Invalid input(s): CO CBG (last 3)  No results for input(s): GLUCAP in the last 72 hours.  Wt Readings from Last 3 Encounters:  01/19/16 74 kg (163 lb 3.2 oz)  11/23/15 80 kg (176 lb 5.9 oz)  10/12/15 86.2 kg (190 lb)    Physical Exam:  Constitutional: He appears well-developed. NAD. Eyes: Right eye ptosis.  Conj are normal.  Neck: Normal range of motion. Neck supple.  Cardiovascular: Normal rateand regular rhythm.  Respiratory: Effort normaland breath sounds normal. No respiratory distress.  GI: Soft. Bowel sounds are normal. He exhibits no distension.  PEG tube in place Neurological: He is awake and fairly alert   Oriented to self, place. Less perseverative. more redirectable. Memory improving. Moves all 4's with 5/5 strength Sensation appears to be intact to light touch throughout. DTRs 3+ LLE. Musc: no wrist/forearm pain Skin:  Edges of absorbable suture exposed above skin Psych: Unable to assess to due cognition  Assessment/Plan: 1. 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 Physiatrist is providing close team supervision and 24 hour management of active medical problems listed below. Physiatrist and rehab team continue to assess barriers to discharge/monitor patient progress toward functional and medical  goals.  Function:  Bathing Bathing position Bathing activity did not occur: Refused Position: Production manager parts bathed by patient: Right arm, Left arm, Chest, Abdomen, Front perineal area, Right upper leg, Left upper leg Body parts bathed by helper: Buttocks, Right lower leg, Left lower leg  Bathing assist Assist Level: Supervision or verbal cues      Upper Body Dressing/Undressing Upper body dressing   What is the patient wearing?: Pull over shirt/dress     Pull over shirt/dress - Perfomed by patient: Thread/unthread right sleeve, Thread/unthread left sleeve, Put head through opening, Pull shirt over trunk Pull over shirt/dress - Perfomed by helper: Pull shirt over trunk        Upper body assist Assist Level: Supervision or verbal cues   Set up : To obtain clothing/put away  Lower Body Dressing/Undressing Lower body dressing   What is the patient wearing?: Underwear, Pants, Non-skid slipper socks Underwear - Performed by patient: Thread/unthread right underwear leg, Thread/unthread left underwear leg, Pull underwear up/down Underwear - Performed by helper: Thread/unthread right underwear leg, Thread/unthread left underwear leg Pants- Performed by patient: Thread/unthread right pants leg, Thread/unthread left pants leg, Pull pants up/down Pants- Performed by helper: Thread/unthread right pants leg, Thread/unthread left pants leg Non-skid slipper socks- Performed by patient: Don/doff right sock, Don/doff left sock Non-skid slipper socks- Performed by helper: Don/doff right sock, Don/doff left sock   Socks - Performed by helper: Don/doff right sock, Don/doff left sock Shoes - Performed by patient: Don/doff right shoe, Don/doff left shoe Shoes - Performed by helper: Fasten right, Fasten left, Don/doff left shoe, Don/doff right shoe  TED Hose - Performed by helper: Don/doff right TED hose, Don/doff left TED hose  Lower body assist Assist for lower body  dressing: Touching or steadying assistance (Pt > 75%)      Toileting Toileting Toileting activity did not occur: No continent bowel/bladder event Toileting steps completed by patient: Adjust clothing prior to toileting, Performs perineal hygiene, Adjust clothing after toileting Toileting steps completed by helper: Performs perineal hygiene, Adjust clothing after toileting Toileting Assistive Devices: Grab bar or rail  Toileting assist Assist level: Touching or steadying assistance (Pt.75%)   Transfers Chair/bed transfer Chair/bed transfer activity did not occur: Safety/medical concerns Chair/bed transfer method: Ambulatory Chair/bed transfer assist level: Touching or steadying assistance (Pt > 75%)       Locomotion Ambulation     Max distance: 150 ft Assist level: Touching or steadying assistance (Pt > 75%)   Wheelchair   Type: Manual Max wheelchair distance: 100 Assist Level: Supervision or verbal cues  Cognition Comprehension Comprehension assist level: Understands basic 50 - 74% of the time/ requires cueing 25 - 49% of the time  Expression Expression assist level: Expresses basic 50 - 74% of the time/requires cueing 25 - 49% of the time. Needs to repeat parts of sentences.  Social Interaction Social Interaction assist level: Interacts appropriately 25 - 49% of time - Needs frequent redirection.  Problem Solving Problem solving assist level: Solves basic 25 - 49% of the time - needs direction more than half the time to initiate, plan or complete simple activities  Memory Memory assist level: Recognizes or recalls 25 - 49% of the time/requires cueing 50 - 75% of the time   Medical Problem List and Plan: 1. 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 Status post ORIF of multiple facial fractures 12/08/2015  -continue CIR therapies,    -wife has arranged OMFS follow up at [facility] after discharge from hospital 2. DVT  Prophylaxis/Anticoagulation: Subcutaneous Lovenox.   -dopplers negative 3. Pain Management: will use tramadol and tylenol for pain.  4. Mood: Seroquel 12.5 mg twice a day---will stop day time dosing  -ritalin-continue at 10mg     5. Neuropsych: This patient is notcapable of making decisions on hisown behalf. 6. Skin/Wound Care: Routine skin checks  -may try to remove retained suture on scalp this week 7. Fluids/Electrolytes/Nutrition: PO reasonable 8.Tracheostomy. Decannulated. Marland Kitchen 9.Dysphagia. Gastrostomy tube 12/03/2015.Site looks good Currently on a dysphagia #1 honey thick liquid diet.  -would stay with soft diet given facial fractures regardless of how much liquids are advanced  -PEG removed with traction without issues   -continue dry dressing 9.Hypertension. Norvasc 10 mg daily, Coreg 12.5 mg twice a day. Monitor with increased mobility 10.Nondisplaced right radial styloid fracture. Nonweightbearing 11. Comminuted distal left radius fracture Status post closed reduction.    -WBAT bilaterally    12..Urinary retention. Flomax 0.8 mg daily---reduced to 0.4mg .   -no incontinence last night    13.Constipation. Laxative assistance   LOS (Days) 25 A FACE TO FACE EVALUATION WAS PERFORMED  Justin Fox 01/21/2016 8:55 AM

## 2016-01-21 NOTE — Progress Notes (Signed)
Occupational Therapy Session Note  Patient Details  Name: Justin Fox MRN: [id number] Date of Birth: 1960/10/24  Late entry for yesterday 01/20/16 60 minute session:Patient refused to get OOB but did engage in playing checkers correctly and competitively for approximately 40 minutes with approimately 10 twenty second rest breaks.    As well, he participated in right hand digital coordination and fine motor skills.  Patient was distracted and required several verbal cues to attend to task.   His words were polite and friendly.   He often spoke about his two daughters and his current wife.  He was left in bed with call bell and alarm engaged.  Short Term Goals: Week 3:  OT Short Term Goal 1 (Week 3): Pt will maintain sustained atteniton for 2 mins during functional tasks with min verbal cues to redirect OT Short Term Goal 1 - Progress (Week 3): Met OT Short Term Goal 2 (Week 3): Pt will perform bathing tasks with min A OT Short Term Goal 2 - Progress (Week 3): Progressing toward goal OT Short Term Goal 3 (Week 3): Pt will perform toileting tasks at supervision level OT Short Term Goal 3 - Progress (Week 3): Met OT Short Term Goal 4 (Week 3): Pt will initiate and complete UB and LB dressing tasks with supervision when presented with clothing OT Short Term Goal 4 - Progress (Week 3): Met  Skilled Therapeutic Interventions/Progress Updates:     Therapy Documentation Precautions:  Precautions Precautions: Fall Restrictions Weight Bearing Restrictions: Yes RUE Weight Bearing: Non weight bearing LUE Weight Bearing: Weight bearing as tolerated RLE Weight Bearing: Weight bearing as tolerated LLE Weight Bearing: Weight bearing as tolerated Other Position/Activity Restrictions: pt. has cast on left forearm, splint on right forearm - can remove Right splint General:   Vital Signs:   Pain: Pain Assessment Pain Assessment: No/denies pain ADL:   Exercises:   Other Treatments:     See Function Navigator for Current Functional Status.   Therapy/Group: Individual Therapy  Herschell Dimes 01/21/2016, 7:34 PM

## 2016-01-21 NOTE — Progress Notes (Signed)
Occupational Therapy Session Note  Patient Details  Name: MERYL AGNER MRN: [id number] Date of Birth: [birth year]  Today's Date: 01/21/2016 OT Individual Time: 0800-0900 OT Individual Time Calculation (min): 60 min     Short Term Goals: Week 4:  OT Short Term Goal 1 (Week 4): STG=LTG secondarty to ELOS  Skilled Therapeutic Interventions/Progress Updates:    Pt initially engaged in BADL retraining including bathing at shower level and dressing with sit<>stand from w/c at sink with wife present.  Pt's wife provides appropriate level of supervision/assistance as well as encouragement.  Pt particularly "grumpy" this morning but agreeable to participate in therapies with max encouragement.  Pt reluctant to take a shower and required max verbal cues for sequencing and participation.  Pt required assistance with donning socks and fastening shoes this morning.  Pt transitioned to [employer] activity with emphasis on attention to task.  Pt required mod verbal cues to attend to 4 min task on Sci[employer].  Pt requested to use toilet and amb to restroom to use toilet with wife's supervision.  Pt successful having BM.  Pt transitioned to ADL apartment and made bed before returning to [place] and engaging in [place] task.  Pt returned to room and remained in w/c wife present.  Focus on BADL retraining, activity tolerance, functional amb without AD, standing balance, task initiation, sequencing, attention to task, family education, and safety awareness. Therapy Documentation Precautions:  Precautions Precautions: Fall Restrictions Weight Bearing Restrictions: Yes RUE Weight Bearing: Non weight bearing LUE Weight Bearing: Weight bearing as tolerated RLE Weight Bearing: Weight bearing as tolerated LLE Weight Bearing: Weight bearing as tolerated Other Position/Activity Restrictions: pt. has cast on left forearm, splint on right forearm - can remove Right splint   Pain:  Pt denied pain  See Function Navigator  for Current Functional Status.   Therapy/Group: Individual Therapy  [place], [place] 01/21/2016, 9:59 AM

## 2016-01-22 ENCOUNTER — Inpatient Hospital Stay (HOSPITAL_COMMUNITY): Payer: 59

## 2016-01-22 DIAGNOSIS — S52352A Displaced comminuted fracture of shaft of radius, left arm, initial encounter for closed fracture: Secondary | ICD-10-CM

## 2016-01-22 DIAGNOSIS — S52352S Displaced comminuted fracture of shaft of radius, left arm, sequela: Secondary | ICD-10-CM

## 2016-01-22 DIAGNOSIS — W19XXXA Unspecified fall, initial encounter: Secondary | ICD-10-CM

## 2016-01-22 NOTE — Progress Notes (Signed)
[facility] PHYSICAL MEDICINE & REHABILITATION     PROGRESS NOTE    Subjective/Complaints: Pt seen laying comfortable in bed this AM.  He denies complaints, however, later notified by nursing of fall.    ROS: somewhat limited due to mental status, but appears to deny CP, SOB, N/V/D.  Objective: Vital Signs: Blood pressure 110/71, pulse 79, temperature 98 F (36.7 C), temperature source Oral, resp. rate 18, weight 74 kg (163 lb 3.2 oz), SpO2 100 %. No results found. No results for input(s): WBC, HGB, HCT, PLT in the last 72 hours. No results for input(s): NA, K, CL, GLUCOSE, BUN, CREATININE, CALCIUM in the last 72 hours.  Invalid input(s): CO CBG (last 3)  No results for input(s): GLUCAP in the last 72 hours.  Wt Readings from Last 3 Encounters:  01/19/16 74 kg (163 lb 3.2 oz)  11/23/15 80 kg (176 lb 5.9 oz)  10/12/15 86.2 kg (190 lb)    Physical Exam:  Constitutional: He appears well-developed. NAD. Eyes: Right eye ptosis.  Conj are normal.  Cardiovascular: Normal rateand regular rhythm.  Respiratory: Effort normaland breath sounds normal. No respiratory distress.  GI: Soft. Bowel sounds are normal. He exhibits no distension.  Neurological: He is fairly alert  Motor: B/l UE/LE 5/5 strength Musc: No edema, no tenderness Skin: Warm and dry. Intact. Previous PEG site c/d/i. Psych: Unable to assess to due cognition  Assessment/Plan: 1. 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 Physiatrist is providing close team supervision and 24 hour management of active medical problems listed below. Physiatrist and rehab team continue to assess barriers to discharge/monitor patient progress toward functional and medical goals.  Function:  Bathing Bathing position Bathing activity did not occur: Refused Position: Production manager  parts bathed by patient: Left arm, Chest, Abdomen, Right arm, Front perineal area, Buttocks, Right upper leg, Left upper leg Body parts bathed by helper: Right lower leg, Left lower leg, Buttocks  Bathing assist Assist Level: Touching or steadying assistance(Pt > 75%)      Upper Body Dressing/Undressing Upper body dressing   What is the patient wearing?: Pull over shirt/dress     Pull over shirt/dress - Perfomed by patient: Thread/unthread right sleeve, Thread/unthread left sleeve, Put head through opening, Pull shirt over trunk Pull over shirt/dress - Perfomed by helper: Pull shirt over trunk        Upper body assist Assist Level: Supervision or verbal cues   Set up : To obtain clothing/put away  Lower Body Dressing/Undressing Lower body dressing   What is the patient wearing?: Underwear, Pants, Socks, Shoes Underwear - Performed by patient: Thread/unthread right underwear leg, Thread/unthread left underwear leg, Pull underwear up/down Underwear - Performed by helper: Thread/unthread right underwear leg, Thread/unthread left underwear leg Pants- Performed by patient: Thread/unthread right pants leg, Thread/unthread left pants leg, Pull pants up/down Pants- Performed by helper: Thread/unthread right pants leg, Thread/unthread left pants leg Non-skid slipper socks- Performed by patient: Don/doff right sock, Don/doff left sock Non-skid slipper socks- Performed by helper: Don/doff right sock, Don/doff left sock   Socks - Performed by helper: Don/doff right sock, Don/doff left sock Shoes - Performed by patient: Don/doff right shoe, Don/doff left shoe Shoes - Performed by helper: Fasten right, Fasten left       TED Hose - Performed by helper: Don/doff right TED hose, Don/doff left TED hose  Lower body assist  Assist for lower body dressing: Touching or steadying assistance (Pt > 75%)      Toileting Toileting Toileting activity did not occur: No continent bowel/bladder  event Toileting steps completed by patient: Adjust clothing prior to toileting, Performs perineal hygiene, Adjust clothing after toileting Toileting steps completed by helper: Performs perineal hygiene, Adjust clothing after toileting Toileting Assistive Devices: Grab bar or rail  Toileting assist Assist level: Supervision or verbal cues   Transfers Chair/bed transfer Chair/bed transfer activity did not occur: Safety/medical concerns Chair/bed transfer method: Ambulatory Chair/bed transfer assist level: Supervision or verbal cues       Locomotion Ambulation     Max distance: 289ft  Assist level: Touching or steadying assistance (Pt > 75%)   Wheelchair   Type: Manual Max wheelchair distance: 100 Assist Level: Supervision or verbal cues  Cognition Comprehension Comprehension assist level: Understands basic 50 - 74% of the time/ requires cueing 25 - 49% of the time  Expression Expression assist level: Expresses basic 50 - 74% of the time/requires cueing 25 - 49% of the time. Needs to repeat parts of sentences.  Social Interaction Social Interaction assist level: Interacts appropriately 25 - 49% of time - Needs frequent redirection.  Problem Solving Problem solving assist level: Solves basic 25 - 49% of the time - needs direction more than half the time to initiate, plan or complete simple activities  Memory Memory assist level: Recognizes or recalls 25 - 49% of the time/requires cueing 50 - 75% of the time   Medical Problem List and Plan: 1. 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 Status post ORIF of multiple facial fractures 12/08/2015  -continue CIR therapies    -wife has arranged OMFS follow up at [facility] after discharge from hospital 2. DVT Prophylaxis/Anticoagulation: Subcutaneous Lovenox.   -dopplers negative 3. Pain Management: tramadol and tylenol for pain.  4. Mood: Seroquel 12.5 qhs   -ritalin-continue at 10mg     5.  Neuropsych: This patient is notcapable of making decisions on hisown behalf. 6. Skin/Wound Care: Routine skin checks 7. Fluids/Electrolytes/Nutrition: PO reasonable 8.Tracheostomy. Decannulated.  9.Dysphagia. Gastrostomy tube 12/03/2015.   Currently on a dysphagia #1 thin thick liquid diet.  PEG removed with traction without issues   -continue dry dressing 9.Hypertension. Norvasc 10 mg daily, Coreg 12.5 mg twice a day. Monitor with increased mobility 10.Nondisplaced right radial styloid fracture. Nonweightbearing 11. Comminuted distal left radius fracture Status post closed reduction.    -WBAT bilaterally 12..Urinary retention. Flomax reduced to 0.4mg .  13.Constipation. Laxative assistance 14. Fall on 10/21  Pt unreliable historian  Appears to be stable at present without any obvious trauma, will cont to monitor with neuro checks   LOS (Days) 26 A FACE TO FACE EVALUATION WAS PERFORMED  Ankit Lorie Phenix 01/22/2016 12:35 PM

## 2016-01-22 NOTE — Progress Notes (Signed)
Occupational Therapy Session Note  Patient Details  Name: Justin Fox MRN: [id number] Date of Birth: January 07, 1961  Today's Date: 01/22/2016 OT Individual Time: 0815-0900 OT Individual Time Calculation (min): 45 min     Short Term Goals:Week 1:  OT Short Term Goal 1 (Week 1): Pt will perform toilet transfer with mod A in order to decrease level of functional transfers.  OT Short Term Goal 1 - Progress (Week 1): Progressing toward goal OT Short Term Goal 2 (Week 1): Pt will engage in 2 minutes of sustained attention during functional task.  OT Short Term Goal 2 - Progress (Week 1): Progressing toward goal OT Short Term Goal 3 (Week 1): Pt will perform grooming tasks with min A. OT Short Term Goal 3 - Progress (Week 1): Progressing toward goal OT Short Term Goal 4 (Week 1): Pt will perform UB dressing with min A.  OT Short Term Goal 4 - Progress (Week 1): Met  Skilled Therapeutic Interventions/Progress Updates:    OT session focused on sequencing and organization of self-care tasks, functional mobility, cognitive remediation, and balance. Pt received supine in bed hesitant for session, however agreeable for dressing. Pt retrieved clothing items with min cues for organization then completed dressing with supervision. Engaged in therapeutic conversation with pt oriented to place, situation ("I had an accident and messed my head up"), and month/year. Pt identified family members in pictures, sustaining attention to topic up to 45 seconds. Pt required frequent rest breaks throughout session, transitioning to supine in bed. Pt initiated toileting, ambulating to bathroom with steadying assist and completing hand hygiene with no cues. He returned to bed and left supine with bedrails in place and all needs in reach.  Therapy Documentation Precautions:  Precautions Precautions: Fall Restrictions Weight Bearing Restrictions: Yes RUE Weight Bearing: Non weight bearing LUE Weight Bearing: Weight  bearing as tolerated RLE Weight Bearing: Weight bearing as tolerated LLE Weight Bearing: Weight bearing as tolerated Other Position/Activity Restrictions: pt. has cast on left forearm, splint on right forearm - can remove Right splint General:   Vital Signs: Therapy Vitals Temp: 98 F (36.7 C) Temp Source: Oral Pulse Rate: 73 Resp: 18 BP: 111/71 Patient Position (if appropriate): Lying Oxygen Therapy SpO2: 98 % O2 Device: Not Delivered Pain:   ADL:   Exercises:   Other Treatments:    See Function Navigator for Current Functional Status.   Therapy/Group: Individual Therapy  Duayne Cal 01/22/2016, 8:28 AM

## 2016-01-22 NOTE — Progress Notes (Deleted)
   01/22/16 0930  What Happened  Was fall witnessed? No  Was patient injured? No  Patient found on floor  Found by Staff-comment (Candice NT)  Stated prior activity ambulating-unassisted  Follow Up  MD notified Posey Pronto  Time MD notified [phone]  Family notified Yes-comment (Pt's wife)  Time family notified 0945  Additional tests No  Progress note created (see row info) Yes  Adult Fall Risk Assessment  Risk Factor Category (scoring not indicated) History of more than one fall within 6 months before admission (document High fall risk)  Patient's Fall Risk High Fall Risk (>13 points)  Adult Fall Risk Interventions  Required Bundle Interventions *See Row Information* High fall risk - low, moderate, and high requirements implemented  Additional Interventions Individualized elimination schedule;Other (Comment) (bed alarm-close monitoring)  Screening for Fall Injury Risk  Risk For Fall Injury- See Row Information  F  Injury Prevention Interventions (bed alarm)  Pain Assessment  Pain Assessment No/denies pain (patient reports "everything hurts" unreliable historian)  Faces Pain Scale 0 (Patient denies/ expresses no pain with palpitation)  Neurological  Neuro (WDL) X  Level of Consciousness Alert  Orientation Level Oriented to person;Oriented to place;Oriented to situation;Oriented to time (this can fluctuate at times)  Cognition Follows commands;Impulsive;Poor attention/concentration;Poor judgement;Poor safety awareness;Memory impairment  Speech Clear  R Pupil Size (mm) 3  R Pupil Shape Round  L Pupil Size (mm) 3  L Pupil Shape Round  Additional Pupil Assessments No  Motor Function/Sensation Assessment Grip  R Hand Grip Present;Strong  L Hand Grip Present;Strong  RUE Motor Response Purposeful movement  RUE Motor Strength 4  LUE Motor Response Purposeful movement  LUE Motor Strength 4  RLE Motor Response Purposeful movement  RLE Motor Strength 4  LLE Motor Response Purposeful  movement  LLE Motor Strength 4  Neuro Symptoms Forgetful  Integumentary  Integumentary (WDL) X  Skin Color Appropriate for ethnicity  Skin Condition Dry  Skin Integrity Abrasion  Abrasion Location Head  Abrasion Location Orientation Right;Left (prior to fall-not new)

## 2016-01-22 NOTE — Progress Notes (Signed)
Occupational Therapy Session Note  Patient Details  Name: Justin Fox MRN: [id number] Date of Birth: 1960-05-22  Today's Date: 01/22/2016 OT Individual Time: 5498-2641 OT Individual Time Calculation (min): 28 min     Short Term Goals: Week 1:  OT Short Term Goal 1 (Week 1): Pt will perform toilet transfer with mod A in order to decrease level of functional transfers.  OT Short Term Goal 1 - Progress (Week 1): Progressing toward goal OT Short Term Goal 2 (Week 1): Pt will engage in 2 minutes of sustained attention during functional task.  OT Short Term Goal 2 - Progress (Week 1): Progressing toward goal OT Short Term Goal 3 (Week 1): Pt will perform grooming tasks with min A. OT Short Term Goal 3 - Progress (Week 1): Progressing toward goal OT Short Term Goal 4 (Week 1): Pt will perform UB dressing with min A.  OT Short Term Goal 4 - Progress (Week 1): Met Week 2:  OT Short Term Goal 1 (Week 2): Pt will perform toilet transfers with mod A OT Short Term Goal 1 - Progress (Week 2): Met OT Short Term Goal 2 (Week 2): Pt will maintain sustained attention for 2 minutes during functional task OT Short Term Goal 2 - Progress (Week 2): Progressing toward goal OT Short Term Goal 3 (Week 2): Pt will perform grooming tasks (wash face, brush teetn, and shave) with min A OT Short Term Goal 3 - Progress (Week 2): Met OT Short Term Goal 4 (Week 2): Pt will perform bathing tasks with min A OT Short Term Goal 4 - Progress (Week 2): Progressing toward goal OT Short Term Goal 5 (Week 2): Pt will perform LB dressing tasks with min A OT Short Term Goal 5 - Progress (Week 2): Met Week 3:  OT Short Term Goal 1 (Week 3): Pt will maintain sustained atteniton for 2 mins during functional tasks with min verbal cues to redirect OT Short Term Goal 1 - Progress (Week 3): Met OT Short Term Goal 2 (Week 3): Pt will perform bathing tasks with min A OT Short Term Goal 2 - Progress (Week 3): Progressing toward  goal OT Short Term Goal 3 (Week 3): Pt will perform toileting tasks at supervision level OT Short Term Goal 3 - Progress (Week 3): Met OT Short Term Goal 4 (Week 3): Pt will initiate and complete UB and LB dressing tasks with supervision when presented with clothing OT Short Term Goal 4 - Progress (Week 3): Met Week 4:  OT Short Term Goal 1 (Week 4): STG=LTG secondarty to ELOS  Skilled Therapeutic Interventions/Progress Updates:   Pt participated in skilled OT session focusing on attention, short term memory, and problem solving. Pt was engaged in word search activity while seated at EOB with max vcs to attend to task throughout. With mod locating cues, pt was able to find words and circle them accurately. Pt recalled 4/10 words with no-min vcs after tx. At end of session pt was left in care of nursing and wife present.   Therapy Documentation Precautions:  Precautions Precautions: Fall Restrictions Weight Bearing Restrictions: Yes RUE Weight Bearing: Weight bearing as tolerated LUE Weight Bearing: Weight bearing as tolerated RLE Weight Bearing: Weight bearing as tolerated LLE Weight Bearing: Weight bearing as tolerated Other Position/Activity Restrictions: pt. has cast on left forearm, splint on right forearm - can remove Right splint General:   Vital Signs: Therapy Vitals Temp: 98.1 F (36.7 C) Temp Source: Oral Pulse Rate: 67 Resp:  18 BP: 120/65 Patient Position (if appropriate): Lying Oxygen Therapy SpO2: 97 % O2 Device: Not Delivered Pain: No c/o pain during session  Pain Assessment Pain Assessment: No/denies pain :    See Function Navigator for Current Functional Status.   Therapy/Group: Individual Therapy  Kalenna Millett A Atha Mcbain 01/22/2016, 4:47 PM

## 2016-01-22 NOTE — Progress Notes (Signed)
   01/22/16 0930  What Happened  Was fall witnessed? No  Was patient injured? No  Patient found on floor  Found by Staff-comment (Candice NT)  Stated prior activity ambulating-unassisted  Follow Up  MD notified Posey Pronto  Time MD notified [phone]  Family notified Yes-comment (Pt's wife)  Time family notified [phone]  Additional tests No  Progress note created (see row info) Yes  Adult Fall Risk Assessment  Risk Factor Category (scoring not indicated) History of more than one fall within 6 months before admission (document High fall risk)  Patient's Fall Risk High Fall Risk (>13 points)  Adult Fall Risk Interventions  Required Bundle Interventions *See Row Information* High fall risk - low, moderate, and high requirements implemented  Additional Interventions Individualized elimination schedule;Other (Comment) (bed alarm-close monitoring)  Screening for Fall Injury Risk  Risk For Fall Injury- See Row Information  F  Injury Prevention Interventions (bed alarm)  Pain Assessment  Pain Assessment No/denies pain (patient reports "everything hurts" unreliable historian)  Faces Pain Scale 0 (Patient denies/ expresses no pain with palpitation)  Neurological  Neuro (WDL) X  Level of Consciousness Alert  Orientation Level Oriented to person;Oriented to place;Oriented to situation;Oriented to time (this can fluctuate at times)  Cognition Follows commands;Impulsive;Poor attention/concentration;Poor judgement;Poor safety awareness;Memory impairment  Speech Clear  R Pupil Size (mm) 3  R Pupil Shape Round  L Pupil Size (mm) 3  L Pupil Shape Round  Additional Pupil Assessments No  Motor Function/Sensation Assessment Grip  R Hand Grip Present;Strong  L Hand Grip Present;Strong  RUE Motor Response Purposeful movement  RUE Motor Strength 4  LUE Motor Response Purposeful movement  LUE Motor Strength 4  RLE Motor Response Purposeful movement  RLE Motor Strength 4  LLE Motor Response Purposeful  movement  LLE Motor Strength 4  Neuro Symptoms Forgetful  Integumentary  Integumentary (WDL) X  Skin Color Appropriate for ethnicity  Skin Condition Dry  Skin Integrity Abrasion  Abrasion Location Head  Abrasion Location Orientation Right;Left (prior to fall-not new)

## 2016-01-22 NOTE — Progress Notes (Signed)
Patient found getting up from floor about 0930. Skin inspected and no wounds/abrasions found. Patient denied/expressed no pain when palpating areas but then when asked again patient reported "hurts all over." When asked if he needed something for pain patient reports "no I don't hurt." Patient poor historian. Patient alert and oriented x4 but this fluctuates- Patient able to tell he is at [facility] but usually adds [facility] to the answer. Continue to monitor.

## 2016-01-23 ENCOUNTER — Inpatient Hospital Stay (HOSPITAL_COMMUNITY): Payer: 59 | Admitting: Physical Therapy

## 2016-01-23 DIAGNOSIS — W19XXXD Unspecified fall, subsequent encounter: Secondary | ICD-10-CM

## 2016-01-23 NOTE — Progress Notes (Signed)
[facility] PHYSICAL MEDICINE & REHABILITATION     PROGRESS NOTE    Subjective/Complaints: Pt seen laying in bed this AM.  He continues to deny symptoms after his fall yesterday.   ROS: somewhat limited due to mental status, but appears to deny CP, SOB, N/V/D.  Objective: Vital Signs: Blood pressure 106/66, pulse 86, temperature 97.7 F (36.5 C), temperature source Oral, resp. rate 18, weight 74 kg (163 lb 3.2 oz), SpO2 98 %. No results found. No results for input(s): WBC, HGB, HCT, PLT in the last 72 hours. No results for input(s): NA, K, CL, GLUCOSE, BUN, CREATININE, CALCIUM in the last 72 hours.  Invalid input(s): CO CBG (last 3)  No results for input(s): GLUCAP in the last 72 hours.  Wt Readings from Last 3 Encounters:  01/19/16 74 kg (163 lb 3.2 oz)  11/23/15 80 kg (176 lb 5.9 oz)  10/12/15 86.2 kg (190 lb)    Physical Exam:  Constitutional: He appears well-developed. NAD. Eyes: Right eye ptosis.  Conj are normal.  Cardiovascular: Normal rateand regular rhythm.  Respiratory: Effort normaland breath sounds normal. No respiratory distress.  GI: Soft. Bowel sounds are normal. He exhibits no distension.  Neurological: He is fairly alert  Motor: B/l UE/LE 5/5 strength Musc: No edema, no tenderness Skin: Warm and dry. Intact. Previous PEG site c/d/i. Psych: Unable to assess to due cognition  Assessment/Plan: 1. 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 Physiatrist is providing close team supervision and 24 hour management of active medical problems listed below. Physiatrist and rehab team continue to assess barriers to discharge/monitor patient progress toward functional and medical goals.  Function:  Bathing Bathing position Bathing activity did not occur: Refused Position: Production manager parts bathed by patient:  Left arm, Chest, Abdomen, Right arm, Front perineal area, Buttocks, Right upper leg, Left upper leg Body parts bathed by helper: Right lower leg, Left lower leg, Buttocks  Bathing assist Assist Level: Touching or steadying assistance(Pt > 75%)      Upper Body Dressing/Undressing Upper body dressing   What is the patient wearing?: Pull over shirt/dress     Pull over shirt/dress - Perfomed by patient: Thread/unthread right sleeve, Thread/unthread left sleeve, Put head through opening, Pull shirt over trunk Pull over shirt/dress - Perfomed by helper: Pull shirt over trunk        Upper body assist Assist Level: Supervision or verbal cues   Set up : To obtain clothing/put away  Lower Body Dressing/Undressing Lower body dressing   What is the patient wearing?: Underwear, Pants, Socks, Shoes Underwear - Performed by patient: Thread/unthread right underwear leg, Thread/unthread left underwear leg, Pull underwear up/down Underwear - Performed by helper: Thread/unthread right underwear leg, Thread/unthread left underwear leg Pants- Performed by patient: Thread/unthread right pants leg, Thread/unthread left pants leg, Pull pants up/down Pants- Performed by helper: Thread/unthread right pants leg, Thread/unthread left pants leg Non-skid slipper socks- Performed by patient: Don/doff right sock, Don/doff left sock Non-skid slipper socks- Performed by helper: Don/doff right sock, Don/doff left sock   Socks - Performed by helper: Don/doff right sock, Don/doff left sock Shoes - Performed by patient: Don/doff right shoe, Don/doff left shoe Shoes - Performed by helper: Fasten right, Fasten left       TED Hose - Performed by helper: Don/doff right TED hose, Don/doff left TED hose  Lower body assist Assist for lower  body dressing: Touching or steadying assistance (Pt > 75%)      Toileting Toileting Toileting activity did not occur: No continent bowel/bladder event Toileting steps completed by  patient: Adjust clothing prior to toileting, Performs perineal hygiene, Adjust clothing after toileting Toileting steps completed by helper: Performs perineal hygiene, Adjust clothing after toileting Toileting Assistive Devices: Grab bar or rail  Toileting assist Assist level: Touching or steadying assistance (Pt.75%)   Transfers Chair/bed transfer Chair/bed transfer activity did not occur: Safety/medical concerns Chair/bed transfer method: Ambulatory Chair/bed transfer assist level: Supervision or verbal cues       Locomotion Ambulation     Max distance: 298ft  Assist level: Touching or steadying assistance (Pt > 75%)   Wheelchair   Type: Manual Max wheelchair distance: 100 Assist Level: Supervision or verbal cues  Cognition Comprehension Comprehension assist level: Understands basic 50 - 74% of the time/ requires cueing 25 - 49% of the time  Expression Expression assist level: Expresses basic 50 - 74% of the time/requires cueing 25 - 49% of the time. Needs to repeat parts of sentences.  Social Interaction Social Interaction assist level: Interacts appropriately 50 - 74% of the time - May be physically or verbally inappropriate.  Problem Solving Problem solving assist level: Solves basic 50 - 74% of the time/requires cueing 25 - 49% of the time  Memory Memory assist level: Recognizes or recalls 50 - 74% of the time/requires cueing 25 - 49% of the time   Medical Problem List and Plan: 1. 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 Status post ORIF of multiple facial fractures 12/08/2015  -continue CIR therapies    -wife has arranged OMFS follow up at [facility] after discharge from hospital 2. DVT Prophylaxis/Anticoagulation: Subcutaneous Lovenox.   -dopplers negative 3. Pain Management: tramadol and tylenol for pain.  4. Mood: Seroquel 12.5 qhs   -ritalin-continue at 10mg     5. Neuropsych: This patient is notcapable of making  decisions on hisown behalf. 6. Skin/Wound Care: Routine skin checks 7. Fluids/Electrolytes/Nutrition: PO reasonable 8.Tracheostomy. Decannulated.  9.Dysphagia. Gastrostomy tube 12/03/2015.   Currently on a dysphagia #1 thin thick liquid diet.  PEG removed with traction without issues   -continue dry dressing 9.Hypertension. Norvasc 10 mg daily, Coreg 12.5 mg twice a day. Monitor with increased mobility 10.Nondisplaced right radial styloid fracture. Nonweightbearing 11. Comminuted distal left radius fracture Status post closed reduction.    -WBAT bilaterally 12..Urinary retention. Resolved. Flomax reduced to 0.4mg .  13.Constipation. Laxative assistance 14. Fall on 10/21  Pt unreliable historian  Appears to be stable at present without any obvious trauma, neuro checks stable, will cont to monitor   LOS (Days) 27 A FACE TO FACE EVALUATION WAS PERFORMED  Justin Fox Lorie Phenix 01/23/2016 10:43 AM

## 2016-01-24 ENCOUNTER — Inpatient Hospital Stay (HOSPITAL_COMMUNITY): Payer: 59 | Admitting: Physical Therapy

## 2016-01-24 ENCOUNTER — Inpatient Hospital Stay (HOSPITAL_COMMUNITY): Payer: 59

## 2016-01-24 ENCOUNTER — Inpatient Hospital Stay (HOSPITAL_COMMUNITY): Payer: 59 | Admitting: Occupational Therapy

## 2016-01-24 ENCOUNTER — Inpatient Hospital Stay (HOSPITAL_COMMUNITY): Payer: 59 | Admitting: Speech Pathology

## 2016-01-24 DIAGNOSIS — W19XXXS Unspecified fall, sequela: Secondary | ICD-10-CM

## 2016-01-24 NOTE — Progress Notes (Addendum)
Physical Therapy Discharge Summary  Patient Details  Name: Justin Fox MRN: [id number] Date of Birth: April 29, 1960  Today's Date: 01/24/2016 PT Individual Time: 1005-1100 and 1630-1700 PT Individual Time Calculation (min): 55 min and 30 min    Patient has met 9 of 12 long term goals due to improved activity tolerance, improved balance, improved postural control, increased strength, increased range of motion, decreased pain, ability to compensate for deficits and functional use of  right upper extremity and left upper extremity.  Patient to discharge at an ambulatory level [place].   Patient's care partner is independent to provide the necessary physical and cognitive assistance at discharge.  Pt has made progress in regards to functional mobility and currently requires min assist overall for ambulation, transfers, stair negotiation and supervision for floor transfers. Pt continues to require max cuing to engage in task and max cuing for sustained attention to various tasks. Pt to d/c at [facility] Level VI.   Reasons goals not met: Pt did not meet goals 2/2 decreased cognition, safety awareness, and overall balance with mobility.  Recommendation:  Patient will benefit from ongoing skilled PT services in home health setting to continue to advance safe functional mobility, address ongoing impairments in decreased cognition, decreased safety awareness, decreased problem solving, impaired dynamic balance, decreased attention, and minimize fall risk.  Equipment: No equipment provided  Reasons for discharge: treatment goals met  Patient/family agrees with progress made and goals achieved: Yes  Skilled PT Treatment: Treatment 1: Pt received in bed & agreeable to tx. Throughout session pt would deny pain stating "I feel good" then later state "I feel like crap". Session focused on functional mobility tasks; pt able to ambulate throughout unit & room with min assist, negotiate 1 step without  rails with min assist, negotiate 12 steps with B rails (6" + 3") with steady assist, and complete car transfer with steady assist. Pt completed transfer from low, soft couch with steady assist and floor transfer with close supervision & cuing. Pt able to recall falling in recent past and reported need to hold on to someone when ambulating. Pt reported need to use restroom, (+) BM but pt stating "it hurts" & RN made aware. Pt able to perform peri-hygiene with assist from therapist to ensure cleanliness. Pt able to recall current month, year, city, hospital name, and his full name & birthday during session. Pt continues to require significant cuing to attend to tasks, such as remaining seated on toilet until he is finished. Pt able to don/doff tennis shoes with supervision. At end of session pt left in bed with all needs within reach & alarm set.  Treatment 2: Pt received in bed & agreeable to tx, noting "I feel good". Pt donned tennis shoes with max encouragement. Pt ambulated x ~130 ft with min assist due to impaired balance, lateral sway. Utilized nu-step level 4 x 5 minutes with mod/max cuing to attend to task and continue to participate. Pt reported need to use restroom and returned to bathroom in room, (+) void with pt managing clothing. Remainder of session focused on gait training throughout unit with pt identifying his room with max cuing to recall room number and therapist passively orienting pt to time and location. At end of session pt left in bed with all needs within reach & alarm set. Nursing student entering room.   PT Discharge Precautions/Restrictions Precautions Precautions: Fall Restrictions RUE Weight Bearing: Weight bearing as tolerated LUE Weight Bearing: Weight bearing as tolerated  Cognition  Overall Cognitive Status: Impaired/Different from baseline Arousal/Alertness: Awake/alert Orientation Level: Oriented to place;Oriented to time;Oriented to person (oriented to city,  hospital, name, year, month) Attention: Sustained Focused Attention: Appears intact Focused Attention Impairment: Verbal basic;Functional basic Sustained Attention: Impaired Sustained Attention Impairment: Functional basic Memory: Impaired Memory Impairment: Decreased recall of new information;Decreased long term memory Awareness: Impaired Awareness Impairment: Intellectual impairment;Emergent impairment Problem Solving: Impaired Problem Solving Impairment: Functional basic Behaviors: Impulsive;Restless Safety/Judgment: Impaired Rancho Los Amigos Scales of Cognitive Functioning: Confused/appropriate    Motor  Generalized weakness.  Mobility Bed Mobility Bed Mobility: Rolling Right;Rolling Left;Supine to Sit;Sit to Supine Rolling Right: 5: Supervision Rolling Left: 5: Supervision Supine to Sit: 5: Supervision Sit to Supine: 5: Supervision Transfers Transfers: Yes Sit to Stand: 4: Min guard Stand to Sit: 4: Min guard   Locomotion  Ambulation Ambulation: Yes Ambulation/Gait Assistance: 4: Min assist Ambulation Distance (Feet): 150 Feet Assistive device: 1 person hand held assist Gait Gait: Yes Gait Pattern:  (inconsistent gait pattern, lateral sway) Stairs / Additional Locomotion Stairs: Yes Stairs Assistance: 4: Min guard Stair Management Technique: Two rails Number of Stairs: 12 Height of Stairs:  (6 inches + 3 inches) Curb: 4: Min Photographer Assessed: Yes Standardized Balance Assessment Standardized Balance Assessment: Timed Up and Go Test Timed Up and Go Test TUG: Normal TUG Normal TUG (seconds): 14   Extremity Assessment  RLE Assessment RLE Assessment: Within Functional Limits LLE Assessment LLE Assessment: Within Functional Limits   See Function Navigator for Current Functional Status.  [name] 01/24/2016, 5:33 PM

## 2016-01-24 NOTE — Progress Notes (Signed)
Speech Language Pathology Daily Session Note  Patient Details  Name: Justin Fox MRN: [id number] Date of Birth: 1961/02/04  Today's Date: 01/24/2016 SLP Individual Time: 1430-1500 SLP Individual Time Calculation (min): 30 min  SLP Individual Time: 1430-1500 SLP Individual Time Calculation (min): 30 min   Short Term Goals: Week 4: SLP Short Term Goal 1 (Week 4): Patient will consume current diet with minimal overt s/s of aspiration with Min A verbal cues for use of swallowing compensatory strategies.  SLP Short Term Goal 2 (Week 4): Patient will demonstrate selective attention to a functional task for ~15 minutes with Min A verbal cues for redirection.  SLP Short Term Goal 3 (Week 4): Patient will demonstrate functional problem solving for basic and familiar tasks with Mod A question and verbal cues.  SLP Short Term Goal 4 (Week 4): Patient will demonstrate orientation to place, time and situation with supervision verbal and visual cues.  SLP Short Term Goal 5 (Week 4): Patient will utilize externall aids to recall new, daily information with Max A multimodal cues.  SLP Short Term Goal 6 (Week 4): Patient will identify 2 physical and 2 cognitive deficits with Mod A question cues.   Skilled Therapeutic Interventions:  Session 1: Skilled therapy intervention focused on cognitive and dysphagia goals. Patient required Total A verbal and tactile cues for recall of swallowing strategies while consuming a breakfast tray of Dys. 1 textures and thin liquid. Patient presented with s/s of aspiration in the form of wet vocal quality x2 and was given verbal cues for throat clearing. Patient demonstrated selective attention to self feeding for about 10 minutes given Min A verbal cues. Patient was oriented x2 to person and situation, requiring Mod A verbal cues for problem solving for use of external calendar aid to orient to date and place. Patient left supine in bed with bed alarm on and all needs within  reach. Continue current plan of care.   Session 2: Skilled therapy intervention focused on cognitive goals. Patient informally administered select subsections of the [place]. Patient scored WFL on basic receptive language and factual-complex yes/no questions. Patient was 50% accurate with auditory comprehension, likely due to deficit in sustained attention. Patient was able to follow 2-step commands with 100% accuracy and 3-step commands with 66% accuracy, demonstrating deficits in storage and attention. Patient independently oriented to person and city, but disoriented to place and situation. Patient required Total A verbal cues for recall of swallowing strategies, and did not demonstrate intellectual awareness of deficits when given Min A question cues. Patient left supine in bed with bed alarm on and all needs within reach.    Function:  Eating Eating     Eating Assist Level: Supervision or verbal cues            Cognition Comprehension Comprehension assist level: Understands basic 50 - 74% of the time/ requires cueing 25 - 49% of the time  Expression   Expression assist level: Expresses basic 25 - 49% of the time/requires cueing 50 - 75% of the time. Uses single words/gestures.  Social Interaction Social Interaction assist level: Interacts appropriately 25 - 49% of time - Needs frequent redirection.  Problem Solving Problem solving assist level: Solves basic 25 - 49% of the time - needs direction more than half the time to initiate, plan or complete simple activities  Memory Memory assist level: Recognizes or recalls 25 - 49% of the time/requires cueing 50 - 75% of the time  Pain Pain Assessment Pain Assessment: No/denies pain  Therapy/Group: Individual Therapy  Justin Fox 01/24/2016, 4:22 PM

## 2016-01-24 NOTE — Plan of Care (Signed)
Problem: RH BOWEL ELIMINATION Goal: RH STG MANAGE BOWEL W/MEDICATION W/ASSISTANCE STG Manage Bowel with Medication with minimal Assistance.   Outcome: Not Progressing LBM 01/21/16, sorbitol PRN given

## 2016-01-24 NOTE — Plan of Care (Signed)
Problem: RH Balance Goal: LTG Patient will maintain dynamic standing balance (PT) LTG:  Patient will maintain dynamic standing balance with assistance during mobility activities (PT)  Outcome: Completed/Met Date Met: 01/24/16 Close supervision standing at sink performing hand hygiene  Problem: RH Bed to Chair Transfers Goal: LTG Patient will perform bed/chair transfers w/assist (PT) LTG: Patient will perform bed/chair transfers with assistance, with/without cues (PT).  Outcome: Not Met (add Reason) Decreased balance  Problem: RH Ambulation Goal: LTG Patient will ambulate in controlled environment (PT) LTG: Patient will ambulate in a controlled environment, # of feet with assistance (PT).  Outcome: Completed/Met Date Met: 01/24/16 150 ft  Goal: LTG Patient will ambulate in home environment (PT) LTG: Patient will ambulate in home environment, # of feet with assistance (PT).  Outcome: Completed/Met Date Met: 01/24/16 50 ft   Problem: RH Wheelchair Mobility Goal: LTG Patient will propel w/c in controlled environment (PT) LTG: Patient will propel wheelchair in controlled environment, # of feet with assist (PT)  Outcome: Adequate for Discharge Pt to d/c at ambulatory level Goal: LTG Patient will propel w/c in home environment (PT) LTG: Patient will propel wheelchair in home environment, # of feet with assistance (PT).  Outcome: Adequate for Discharge Pt to d/c at ambulatory level  Problem: RH Stairs Goal: LTG Patient will ambulate up and down stairs w/assist (PT) LTG: Patient will ambulate up and down # of stairs with assistance (PT)  Outcome: Completed/Met Date Met: 01/24/16 1 step without rails, 12 steps with B rails  Problem: RH Attention Goal: LTG Patient will demonstrate focused/sustained (PT) LTG:  Patient will demonstrate focused/sustained/selective/alternating/divided attention during functional mobility in specific environment with assist for # of minutes  (PT)  Outcome:  Not Met (add Reason) Pt requires heavy cuing for sustained attention  Problem: RH Awareness Goal: LTG: Patient will demonstrate intellectual/emergent (PT) LTG: Patient will demonstrate intellectual/emergent/anticipatory awareness with assist during a mobility activity  (PT)  Outcome: Not Met (add Reason) Due to decreased cognition

## 2016-01-24 NOTE — Progress Notes (Signed)
Occupational Therapy Session Note  Patient Details  Name: SEVAK CHENNAULT MRN: [id number] Date of Birth: 1960-12-06  Today's Date: 01/24/2016 OT Individual Time: [id number] and 1400-1430 OT Individual Time Calculation (min): 29 min and 30 min     Short Term Goals: Week 4:  OT Short Term Goal 1 (Week 4): STG=LTG secondarty to ELOS  Skilled Therapeutic Interventions/Progress Updates:     Session 1: Upon entering the room, pt supine in bed with no c/o pain. Pt ambulating to calendar with steady assistance and verbalized date as Monday. Pt given min questioning cues and he was able to verbalize he is discharging tomorrow. Pt states, "I am ready. I'm going home!" Pt required max cues to attend to task as therapist attempting to engage pt in functional tasks within the room. Pt returned to bed and bed alarm activated. Call bell and all needed items within reach.   Session 2: Upon entering the room, pt supine in bed with no c/o pain this session. Pt calling therapist by name as he remembers from previous morning session. Pt donned B shoes and tied laces with increased time and min verbal cues. Pt ambulates to RN station with steady assist and without use of AD to obtain water. Pt ambulating with cup of water to day room where he needs mod cues for small sips. Pt able to navigate to room and returns to bed. Bed alarm activated. Call bell and all needed items within reach upon exiting the room.   Therapy Documentation Precautions:  Precautions Precautions: Fall Restrictions Weight Bearing Restrictions: Yes RUE Weight Bearing: Weight bearing as tolerated LUE Weight Bearing: Weight bearing as tolerated RLE Weight Bearing: Weight bearing as tolerated LLE Weight Bearing: Weight bearing as tolerated Other Position/Activity Restrictions: pt. has cast on left forearm, splint on right forearm - can remove Right splint General:   Vital Signs: Therapy Vitals Temp: 98.3 F (36.8 C) Temp Source:  Oral Pulse Rate: 82 Resp: 18 BP: 112/71 Patient Position (if appropriate): Lying Oxygen Therapy SpO2: 97 % O2 Device: Not Delivered Pain: Pain Assessment Pain Assessment: No/denies pain Pain Score: 7  Pain Location: Leg Pain Orientation: Right;Left Pain Descriptors / Indicators: Aching Pain Frequency: Intermittent Pain Onset: On-going Patients Stated Pain Goal: 2 Pain Intervention(s): Medication (See eMAR);Repositioned Multiple Pain Sites: No  See Function Navigator for Current Functional Status.   Therapy/Group: Individual Therapy  Phineas Semen 01/24/2016, 5:16 PM

## 2016-01-24 NOTE — Progress Notes (Signed)
Occupational Therapy Session Note  Patient Details  Name: Justin Fox MRN: [id number] Date of Birth: Aug 17, 1960  Today's Date: 01/24/2016 OT Individual Time: 0800-0900 OT Individual Time Calculation (min): 60 min     Short Term Goals: Week 4:  OT Short Term Goal 1 (Week 4): STG=LTG secondarty to ELOS  Skilled Therapeutic Interventions/Progress Updates:    Pt with wife upon arrival.  Per wife's report, pt completed bathing and dressing tasks with her this morning prior to therapy.  Pt's wife reported that pt required min A for bathing tasks and LB dressing tasks.  Pt completed grooming and UB bathing tasks with verbal cues to initiate and complete tasks.  Pt agreed that he needed to shave and completed shaving tasks with min verbal cues for thoroughness.  Pt requested to use the toilet and transferred to toilet with supervision.  Pt performed toileting tasks with steady A.  Pt engaged in variety of standing balance tasks with focus on attention to task and safety awareness.  Pt continues to require max encouragement to participate and max verbal cues to initiate and attend to tasks.  Pt oriented to year and place this morning.  Pt returned to room and remained in bed with bed alarm activated and all needs within reach.  Therapy Documentation Precautions:  Precautions Precautions: Fall Restrictions Weight Bearing Restrictions: Yes RUE Weight Bearing: Weight bearing as tolerated LUE Weight Bearing: Weight bearing as tolerated RLE Weight Bearing: Weight bearing as tolerated LLE Weight Bearing: Weight bearing as tolerated Other Position/Activity Restrictions: pt. has cast on left forearm, splint on right forearm - can remove Right splint General:   Vital Signs: Therapy Vitals Pulse Rate: 75 BP: 111/76 Pain:   ADL:   Exercises:   Other Treatments:    See Function Navigator for Current Functional Status.   Therapy/Group: Individual Therapy  Cherif, Vereb 01/24/2016, 10:06 AM

## 2016-01-24 NOTE — Discharge Summary (Signed)
Discharge summary job [id number]

## 2016-01-24 NOTE — Progress Notes (Signed)
[place] PHYSICAL MEDICINE & REHABILITATION     PROGRESS NOTE    Subjective/Complaints: Pt walking with therapies today.  He is ambulating with close supervision/Min A.   ROS: somewhat limited due to mental status, but appears to deny CP, SOB, N/V/D.  Objective: Vital Signs: Blood pressure 111/76, pulse 75, temperature 98.7 F (37.1 C), temperature source Oral, resp. rate 18, weight 74 kg (163 lb 3.2 oz), SpO2 95 %. No results found. No results for input(s): WBC, HGB, HCT, PLT in the last 72 hours. No results for input(s): NA, K, CL, GLUCOSE, BUN, CREATININE, CALCIUM in the last 72 hours.  Invalid input(s): CO CBG (last 3)  No results for input(s): GLUCAP in the last 72 hours.  Wt Readings from Last 3 Encounters:  01/19/16 74 kg (163 lb 3.2 oz)  11/23/15 80 kg (176 lb 5.9 oz)  10/12/15 86.2 kg (190 lb)    Physical Exam:  Constitutional: He appears well-developed. NAD. Eyes: Right eye ptosis.  Conj are normal.  Cardiovascular: Normal rateand regular rhythm.  Respiratory: Effort normaland breath sounds normal. No respiratory distress.  GI: Soft. Bowel sounds are normal. He exhibits no distension.  Neurological: He is fairly alert  Motor: 5/5 throughout Musc: No edema, no tenderness Skin: Warm and dry. Intact. Previous PEG site c/d/i. Psych: Appears impulsive.   Assessment/Plan: 1. 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 Physiatrist is providing close team supervision and 24 hour management of active medical problems listed below. Physiatrist and rehab team continue to assess barriers to discharge/monitor patient progress toward functional and medical goals.  Function:  Bathing Bathing position Bathing activity did not occur: Refused Position: Production manager parts bathed by patient: Left arm, Chest, Abdomen, Right  arm, Front perineal area, Buttocks, Right upper leg, Left upper leg, Right lower leg, Left lower leg Body parts bathed by helper: Right lower leg, Left lower leg, Buttocks  Bathing assist Assist Level: Touching or steadying assistance(Pt > 75%)      Upper Body Dressing/Undressing Upper body dressing   What is the patient wearing?: Pull over shirt/dress     Pull over shirt/dress - Perfomed by patient: Thread/unthread right sleeve, Thread/unthread left sleeve, Put head through opening, Pull shirt over trunk Pull over shirt/dress - Perfomed by helper: Pull shirt over trunk        Upper body assist Assist Level: Supervision or verbal cues   Set up : To obtain clothing/put away  Lower Body Dressing/Undressing Lower body dressing   What is the patient wearing?: Underwear, Pants, Socks, Shoes Underwear - Performed by patient: Thread/unthread right underwear leg, Thread/unthread left underwear leg, Pull underwear up/down Underwear - Performed by helper: Thread/unthread right underwear leg, Thread/unthread left underwear leg Pants- Performed by patient: Thread/unthread right pants leg, Thread/unthread left pants leg, Pull pants up/down Pants- Performed by helper: Thread/unthread right pants leg, Thread/unthread left pants leg Non-skid slipper socks- Performed by patient: Don/doff right sock, Don/doff left sock Non-skid slipper socks- Performed by helper: Don/doff right sock, Don/doff left sock   Socks - Performed by helper: Don/doff right sock, Don/doff left sock Shoes - Performed by patient: Don/doff right shoe, Don/doff left shoe, Fasten right, Fasten left Shoes - Performed by helper: Fasten right, Fasten left       TED Hose - Performed by helper: Don/doff right TED hose, Don/doff left TED hose  Lower body assist Assist for  lower body dressing: Touching or steadying assistance (Pt > 75%)      Toileting Toileting Toileting activity did not occur: No continent bowel/bladder  event Toileting steps completed by patient: Adjust clothing prior to toileting, Performs perineal hygiene, Adjust clothing after toileting Toileting steps completed by helper: Adjust clothing prior to toileting, Adjust clothing after toileting Toileting Assistive Devices: Grab bar or rail  Toileting assist Assist level: Touching or steadying assistance (Pt.75%)   Transfers Chair/bed transfer Chair/bed transfer activity did not occur: Safety/medical concerns Chair/bed transfer method: Ambulatory Chair/bed transfer assist level: Supervision or verbal cues       Locomotion Ambulation     Max distance: 215ft  Assist level: Touching or steadying assistance (Pt > 75%)   Wheelchair   Type: Manual Max wheelchair distance: 100 Assist Level: Supervision or verbal cues  Cognition Comprehension Comprehension assist level: Understands basic 50 - 74% of the time/ requires cueing 25 - 49% of the time  Expression Expression assist level: Expresses basic 50 - 74% of the time/requires cueing 25 - 49% of the time. Needs to repeat parts of sentences.  Social Interaction Social Interaction assist level: Interacts appropriately 50 - 74% of the time - May be physically or verbally inappropriate.  Problem Solving Problem solving assist level: Solves basic 75 - 89% of the time/requires cueing 10 - 24% of the time  Memory Memory assist level: Recognizes or recalls 50 - 74% of the time/requires cueing 25 - 49% of the time   Medical Problem List and Plan: 1. 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 Status post ORIF of multiple facial fractures 12/08/2015  -continue CIR therapies    -wife has arranged OM[facility] follow up at [facility] after discharge from hospital, will need to follow up regarding retained stent in b/l nostrils 2. DVT Prophylaxis/Anticoagulation: Subcutaneous Lovenox.   -dopplers negative 3. Pain Management: tramadol and tylenol for pain.  4. Mood:  Seroquel 12.5 qhs   -ritalin-continue at 10mg     5. Neuropsych: This patient is notcapable of making decisions on hisown behalf. 6. Skin/Wound Care: Routine skin checks 7. Fluids/Electrolytes/Nutrition: PO reasonable 8.Tracheostomy. Decannulated.  9.Dysphagia. Gastrostomy tube 12/03/2015.   Currently on a dysphagia #1 thin thick liquid diet.  PEG removed with traction without issues   -continue dry dressing 9.Hypertension. Norvasc 10 mg daily, Coreg 12.5 mg twice a day. Monitor with increased mobility  Controlled 10/23 10.Nondisplaced right radial styloid fracture. Nonweightbearing 11. Comminuted distal left radius fracture Status post closed reduction.    -WBAT bilaterally 12..Urinary retention. Resolved. Flomax reduced to 0.4mg .  13.Constipation. Laxative assistance 14. Fall on 10/21  Pt unreliable historian  Appears to be stable at present without any obvious trauma, neuro checks stable, will cont to monitor  No issues since fall   LOS (Days) 28 A FACE TO FACE EVALUATION WAS PERFORMED  Justin Fox 01/24/2016 11:49 AM

## 2016-01-24 NOTE — Progress Notes (Signed)
Occupational Therapy Discharge Summary  Patient Details  Name: Justin Fox MRN: [id number] Date of Birth: 07/31/1960   Patient has met 10 of 13 long term goals due to improved activity tolerance, improved balance, ability to compensate for deficits, improved attention, improved awareness and improved coordination.  Pt made steady gains with BADLs during this admission but continues to require max encouragement to actively engage and max verba cues to initiate tasks and attend to tasks.  Pt requires max verbal cues for sustained attention, recalling day to day events, orientation, and intellectual awareness of deficits.  Pt completes BADLs at supervision level with min A for LB dressing tasks, bathing tasks, and toileting tasks.  Pt's wife and daughter have been present and have actively participated in therapy sessions.  Pt's wife and daughter demonstrate appropriate supervision/assistance when engaging with pt.  Pt demonstrates behaviors consistent with [place] Level VI. Patient to discharge at overall supervision - min A level.  Patient's care partner is independent to provide the necessary physical and cognitive assistance at discharge.    Reasons goals not met: goals for memory, attention, and awareness not met secondary to pt continuing to need max A for these tasks due to decreased cognition.   Recommendation:  Patient will benefit from ongoing skilled OT services in home health setting to continue to advance functional skills in the area of BADL.  Equipment: Shower seat  Reasons for discharge: treatment goals met  Patient/family agrees with progress made and goals achieved: Yes  OT Discharge   Vision/Perception  Vision- History Baseline Vision/History: Wears glasses Wears Glasses: Reading only Patient Visual Report: No change from baseline  Cognition Overall Cognitive Status: Impaired/Different from baseline Arousal/Alertness: Awake/alert Orientation Level: Oriented to  person;Oriented to place;Disoriented to time Attention: Sustained Focused Attention: Appears intact Focused Attention Impairment: Verbal basic;Functional basic Sustained Attention: Impaired Sustained Attention Impairment: Verbal basic;Functional basic Memory: Impaired Memory Impairment: Storage deficit;Retrieval deficit;Decreased recall of new information;Decreased short term memory Awareness: Impaired Awareness Impairment: Intellectual impairment Problem Solving: Impaired Problem Solving Impairment: Verbal basic;Functional basic Behaviors: Impulsive;Restless Safety/Judgment: Impaired [place] [employer] Scales of Cognitive Functioning: Confused/appropriate Sensation Sensation Light Touch: Appears Intact Stereognosis: Not tested Hot/Cold: Appears Intact Proprioception: Appears Intact Trunk/Postural Assessment  Cervical Assessment Cervical Assessment: Within Functional Limits Thoracic Assessment Thoracic Assessment: Within Functional Limits Lumbar Assessment Lumbar Assessment: Within Functional Limits Postural Control Righting Reactions: delayed  Balance Balance Balance Assessed: Yes Standardized Balance Assessment Standardized Balance Assessment: Timed Up and Go Test Timed Up and Go Test TUG: Normal TUG Normal TUG (seconds): 14 Extremity/Trunk Assessment RUE Assessment RUE Assessment: Within Functional Limits LUE Assessment LUE Assessment: Within Functional Limits   See Function Navigator for Current Functional Status.  Bradyn, Soward [facility] 01/24/2016, 2:51 PM

## 2016-01-25 ENCOUNTER — Telehealth: Payer: Self-pay | Admitting: Family Medicine

## 2016-01-25 MED ORDER — TAMSULOSIN HCL 0.4 MG PO CAPS: 0.4000 mg | ORAL_CAPSULE | Freq: Every day | ORAL | 1 refills | Status: DC

## 2016-01-25 MED ORDER — AMLODIPINE BESYLATE 10 MG PO TABS: 10.0000 mg | ORAL_TABLET | Freq: Every day | ORAL | 1 refills | Status: DC

## 2016-01-25 MED ORDER — CARVEDILOL 12.5 MG PO TABS: 12.5000 mg | ORAL_TABLET | Freq: Two times a day (BID) | ORAL | 1 refills | Status: DC

## 2016-01-25 MED ORDER — METHYLPHENIDATE HCL 10 MG PO TABS: 10.0000 mg | ORAL_TABLET | Freq: Two times a day (BID) | ORAL | 0 refills | Status: DC

## 2016-01-25 MED ORDER — FAMOTIDINE 20 MG PO TABS: 20.0000 mg | ORAL_TABLET | Freq: Two times a day (BID) | ORAL | 0 refills | Status: DC

## 2016-01-25 MED ORDER — QUETIAPINE FUMARATE 25 MG PO TABS: 12.5000 mg | ORAL_TABLET | Freq: Every day | ORAL | 1 refills | Status: DC

## 2016-01-25 MED ORDER — TRAMADOL HCL 50 MG PO TABS: 50.0000 mg | ORAL_TABLET | Freq: Four times a day (QID) | ORAL | 0 refills | Status: DC | PRN

## 2016-01-25 NOTE — Telephone Encounter (Signed)
fyi

## 2016-01-25 NOTE — Discharge Instructions (Signed)
Inpatient Rehab Discharge Instructions  Justin Fox Discharge date and time: No discharge date for patient encounter.   Activities/Precautions/ Functional Status: Activity: activity as tolerated Diet: dysphagia #1 thin liquids Wound Care: none needed Functional status:  ___ No restrictions     ___ Walk up steps independently ___ 24/7 supervision/assistance   ___ Walk up steps with assistance ___ Intermittent supervision/assistance  ___ Bathe/dress independently ___ Walk with walker     _x__ Bathe/dress with assistance ___ Walk Independently    ___ Shower independently ___ Walk with assistance    ___ Shower with assistance ___ No alcohol     ___ Return to work/school ________  COMMUNITY REFERRALS UPON DISCHARGE:   Home Health:   PT     OT     ST         Agency:  [facility] Phone:  [phone] Medical Equipment/Items Ordered:  18"x18" lightweight w/c with basic cushion;  Shower seat  Agency/Supplier:  [facility]           Phone:  [phone]  GENERAL COMMUNITY RESOURCES FOR PATIENT/FAMILY: Support Groups:  [place] [facility]                              Meets the 2nd Tuesday of each month at 7:00 PM                              In the dayroom of the Rehab Unit ([address]) at [facility]                              Open to [place] and their family/friends                              Lennart Pall  [phone]                               [email or web address]                               [employer] [place] gave you this information  Special Instructions: No driving   arrange for follow-up with oral maxillary surgery in regards to multiple facial fractures and plan of care   My questions have been answered and I understand these instructions. I will adhere to these goals and the provided educational materials after my discharge from the hospital.  Patient/Caregiver Signature  _______________________________ Date __________  Clinician Signature _______________________________________ Date __________  Please bring this form and your medication list with you to all your follow-up doctor's appointments.

## 2016-01-25 NOTE — Progress Notes (Signed)
Social Work Discharge Note  The overall goal for the admission was met for:   Discharge location: Yes - home with wife and dtr  Length of Stay: Yes - 29 days  Discharge activity level: Yes - 24/7 supervision  Home/community participation: Yes  Services provided included: MD, RD, PT, OT, SLP, RN, TR, Pharmacy, Neuropsych and SW  Financial Services: Private Insurance: Theme park manager  Follow-up services arranged: Home Health: PT/OT/ST from [facility], DME: 18"x18" lightweight w/c with basic cushion; shower seat with back from [facility] and Patient/Family has no preference for HH/DME agencies  Comments (or additional information): Pt to go to his home with his wife and dtr and family friend (1 day a week) being with pt 24/7.  Family has received family education and feels prepared to take care of pt at home.  Pt's dtr is a Marine scientist and family has good medical support from another family friend.  Family does very well with pt and have a positive, hopeful attitude for his recovery and are very dedicated to pt.  CSW gave brain injury support group information to wife and encouraged her to remove any potential weapons from the home.  CSW remains available via telephone should needs arise.  Patient/Family verbalized understanding of follow-up arrangements: Yes  Individual responsible for coordination of the follow-up plan: pt's wife with support from pt's dtr, Leona Carry  Confirmed correct DME delivered: Trey Sailors 01/25/2016    Kasyn Stouffer, Silvestre Mesi

## 2016-01-25 NOTE — Telephone Encounter (Signed)
thx

## 2016-01-25 NOTE — Telephone Encounter (Signed)
Pt is being discharged from [facility] today.  Pt fell from a 30 foot Ladder on 11/23/15.  Pt has head and facial fractures.  I have scheduled a hospital follow up appointment/MW

## 2016-01-25 NOTE — Progress Notes (Signed)
Speech Language Pathology Discharge Summary  Patient Details  Name: Justin Fox MRN: [id number] Date of Birth: 05-28-1960   Patient has met 2 of 8 long term goals.  Patient to discharge at overall Mod;Max level.   Reasons goals not met: Patient continues to require overall Mod A for use of swallowing strategies and Max A to complete functional and familiar tasks safely    Clinical Impression/Discharge Summary:  Although patient only met 2 of 8 LTG's this admission, patient has made functional gains in both cognitive and swallowing function. Currently, patient demonstrates behaviors consistent with a Rancho Level VI and requires overall Max A to complete functional and familiar tasks safely in regards to attention, problem solving, recall and awareness. Patient is also consuming Dys. 1 textures with thin liquids with minimal overt s/s of aspiration and requires Mod A for use of swallowing compensatory strategies. Patient has been consuming trials of Dys. 2 textures and demonstrating efficient mastication but requires Max A for safety due to impulsivity. Patient and family education is complete and patient will discharge home with 24 hour supervision from family.  Patient would benefit from f/u home health SLP services to maximize cognitive and swallowing function and reduce caregiver burden.   Care Partner:  Caregiver Able to Provide Assistance: Yes  Type of Caregiver Assistance: Physical;Cognitive  Recommendation:  Home Health SLP;24 hour supervision/assistance  Rationale for SLP Follow Up: Maximize cognitive function and independence;Maximize swallowing safety;Reduce caregiver burden   Equipment: N/A   Reasons for discharge: Discharged from hospital   Patient/Family Agrees with Progress Made and Goals Achieved: Yes     [place], [place] 01/25/2016, 7:57 AM

## 2016-01-25 NOTE — Final Progress Note (Signed)
Patient discharged to home accompanied by his wife and daughter. 

## 2016-01-25 NOTE — Progress Notes (Signed)
[facility]     PROGRESS NOTE    Subjective/Complaints: Pt laying in his bed this AM.  He slept well overnight and is ready to be discharged today, noting his wife will be picking him up.   ROS: somewhat limited due to mental status, but appears to deny CP, SOB, N/V/D.  Objective: Vital Signs: Blood pressure 121/73, pulse 70, temperature 98.3 F (36.8 C), temperature source Oral, resp. rate 18, weight 77 kg (169 lb 12.8 oz), SpO2 99 %. No results found. No results for input(s): WBC, HGB, HCT, PLT in the last 72 hours. No results for input(s): NA, K, CL, GLUCOSE, BUN, CREATININE, CALCIUM in the last 72 hours.  Invalid input(s): CO CBG (last 3)  No results for input(s): GLUCAP in the last 72 hours.  Wt Readings from Last 3 Encounters:  01/24/16 77 kg (169 lb 12.8 oz)  11/23/15 80 kg (176 lb 5.9 oz)  10/12/15 86.2 kg (190 lb)    Physical Exam:  Constitutional: He appears well-developed. NAD. Eyes: Right eye ptosis.  Conj are normal.  Cardiovascular: Normal rateand regular rhythm.  Respiratory: Effort normaland breath sounds normal. No respiratory distress.  GI: Soft. Bowel sounds are normal. He exhibits no distension.  Neurological: He is fairly alert  Motor: 5/5 throughout Musc: No edema, no tenderness Skin: Warm and dry. Intact. Previous PEG site c/d/i. Psych: Appears impulsive.   Assessment/Plan: 1. 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 Physiatrist is providing close team supervision and 24 hour management of active medical problems listed below. Physiatrist and rehab team continue to assess barriers to discharge/monitor patient progress toward functional and medical goals.  Function:  Bathing Bathing position Bathing activity did not occur: Refused Position: Production manager  parts bathed by patient: Left arm, Chest, Abdomen, Right arm, Front perineal area, Buttocks, Right upper leg, Left upper leg, Right lower leg, Left lower leg Body parts bathed by helper: Right lower leg, Left lower leg, Buttocks  Bathing assist Assist Level: Touching or steadying assistance(Pt > 75%)      Upper Body Dressing/Undressing Upper body dressing   What is the patient wearing?: Pull over shirt/dress     Pull over shirt/dress - Perfomed by patient: Thread/unthread right sleeve, Thread/unthread left sleeve, Put head through opening, Pull shirt over trunk Pull over shirt/dress - Perfomed by helper: Pull shirt over trunk        Upper body assist Assist Level: Supervision or verbal cues   Set up : To obtain clothing/put away  Lower Body Dressing/Undressing Lower body dressing   What is the patient wearing?: Underwear, Pants, Socks, Shoes Underwear - Performed by patient: Thread/unthread right underwear leg, Thread/unthread left underwear leg, Pull underwear up/down Underwear - Performed by helper: Thread/unthread right underwear leg, Thread/unthread left underwear leg Pants- Performed by patient: Thread/unthread right pants leg, Thread/unthread left pants leg, Pull pants up/down Pants- Performed by helper: Thread/unthread right pants leg, Thread/unthread left pants leg Non-skid slipper socks- Performed by patient: Don/doff right sock, Don/doff left sock Non-skid slipper socks- Performed by helper: Don/doff right sock, Don/doff left sock   Socks - Performed by helper: Don/doff right sock, Don/doff left sock Shoes - Performed by patient: Don/doff right shoe, Don/doff left shoe, Fasten right, Fasten left Shoes - Performed by helper: Fasten right, Fasten left       TED Hose - Performed by helper:  Don/doff right TED hose, Don/doff left TED hose  Lower body assist Assist for lower body dressing: Touching or steadying assistance (Pt > 75%)      Toileting Toileting Toileting  activity did not occur: No continent bowel/bladder event Toileting steps completed by patient: Adjust clothing prior to toileting, Performs perineal hygiene, Adjust clothing after toileting Toileting steps completed by helper: Adjust clothing prior to toileting, Adjust clothing after toileting Toileting Assistive Devices: Grab bar or rail  Toileting assist Assist level: Supervision or verbal cues   Transfers Chair/bed transfer Chair/bed transfer activity did not occur: Safety/medical concerns Chair/bed transfer method: Ambulatory Chair/bed transfer assist level: Touching or steadying assistance (Pt > 75%)       Locomotion Ambulation     Max distance: >150 ft Assist level: Touching or steadying assistance (Pt > 75%)   Wheelchair Wheelchair activity did not occur: Safety/medical concerns Type: Manual Max wheelchair distance: 100 Assist Level: Supervision or verbal cues  Cognition Comprehension Comprehension assist level: Understands basic 75 - 89% of the time/ requires cueing 10 - 24% of the time  Expression Expression assist level: Expresses basic 75 - 89% of the time/requires cueing 10 - 24% of the time. Needs helper to occlude trach/needs to repeat words.  Social Interaction Social Interaction assist level: Interacts appropriately 75 - 89% of the time - Needs redirection for appropriate language or to initiate interaction.  Problem Solving Problem solving assist level: Solves basic 75 - 89% of the time/requires cueing 10 - 24% of the time  Memory Memory assist level: Recognizes or recalls 75 - 89% of the time/requires cueing 10 - 24% of the time   Medical Problem List and Plan: 1. 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 Status post ORIF of multiple facial fractures 12/08/2015  -D/c today, follow up with Dr. Naaman Plummer for transitional care management in 1-2 weeks  -wife has arranged OMFS follow up at [facility] after discharge from  hospital, will need to follow up regarding retained stent in b/l nostrils 2. DVT Prophylaxis/Anticoagulation: Subcutaneous Lovenox.   -dopplers negative 3. Pain Management: tramadol and tylenol for pain.  4. Mood: Seroquel 12.5 qhs   -ritalin-continue at 10mg     5. Neuropsych: This patient is notcapable of making decisions on hisown behalf. 6. Skin/Wound Care: Routine skin checks 7. Fluids/Electrolytes/Nutrition: PO reasonable 8.Tracheostomy. Decannulated.  9.Dysphagia. Gastrostomy tube 12/03/2015.   Currently on a dysphagia #1 thin thick liquid diet.  PEG removed with traction without issues   -continue dry dressing 9.Hypertension. Norvasc 10 mg daily, Coreg 12.5 mg twice a day. Monitor with increased mobility  Controlled 10/24 10.Nondisplaced right radial styloid fracture. Nonweightbearing 11. Comminuted distal left radius fracture Status post closed reduction.    -WBAT bilaterally 12..Urinary retention. Resolved. Flomax reduced to 0.4mg .  13.Constipation. Laxative assistance 14. Fall on 10/21  Pt unreliable historian  Appears to be stable at present without any obvious trauma, neuro checks stable, will cont to monitor  No issues since fall   LOS (Days) 29 A FACE TO FACE EVALUATION WAS PERFORMED  Justin Fox 01/25/2016 11:56 AM

## 2016-01-25 NOTE — Discharge Summary (Signed)
Justin Fox, BRISSEY NO.:  [id number]  MEDICAL RECORD NO.:  [id number]  LOCATION:  [id number]                        FACILITY:  [facility]  PHYSICIAN:  Meredith Staggers, M.D.DATE OF BIRTH:  September 17, 1960  DATE OF ADMISSION:  12/27/2015 DATE OF DISCHARGE:  01/25/2016                              DISCHARGE SUMMARY   DISCHARGE DIAGNOSES: 1. Traumatic brain injury, subdural hematoma with multiple facial     fractures after a fall. 2. Subcutaneous Lovenox for deep venous thrombosis prophylaxis. 3. Mood. 4. Dysphagia. 5. Tracheostomy - decannulated. 6. Hypertension. 7. Nondisplaced right radial styloid fracture. 8. Comminuted distal left radius fracture with closed reduction. 9. Urinary retention, resolved.  HISTORY OF PRESENT ILLNESS:  This is a 55 year old right-handed male with history of hypertension, admitted to [facility] on November 23, 2015, after a fall 30-feet from a scaffolding, question loss of consciousness.  Per chart review, he is married, lives with his wife, self-employed Chief Strategy Officer.  One-level home.  Cranial CT scan imaging demonstrated multiple skull base frontal sinus and orbital fractures on the right.  He also had a right frontal contusion and associated subdural without midline shift.  Complex closure, right scalp laceration of left scalp incision.  Neurosurgery consulted, advised conservative care.  He remained in the ICU for 13 days.  The patient with intracranial hypertension, cerebral hypoxia, functional quadriparesis that steadily improved.  Sustained nondisplaced right radial styloid fracture as well as comminuted distal left radius fracture, underwent closed reduction.  Nonweightbearing bilateral upper extremities. Hospital course; tracheostomy, gastrostomy tube, December 03, 2015, decannulated December 21, 2015.  Underwent ORIF, December 08, 2015, for multiple facial fractures with reconstruction.  Hospital course, pain management.  Diet,  slowly advanced from a dysphagia 1 honey thick liquids.  Subcutaneous Lovenox for DVT prophylaxis.  The patient was admitted for comprehensive rehab program.  PAST MEDICAL HISTORY:  See discharge diagnoses.  SOCIAL HISTORY:  Lives with his wife and independent prior to admission. Self-employed.  FUNCTIONAL STATUS:  Upon admission to [place]; min-to-mod assist, 20 feet; nonweightbearing left upper and right upper extremity; mod-to- max assist, ADLs.  PHYSICAL EXAMINATION:  VITAL SIGNS:  Blood pressure 108/60, pulse 80, respirations 18, temperature 98. GENERAL:  This was a lethargic male, impulsive, decreased safety awareness, poor insight to deficits.  He follows some simple commands. LUNGS:  Decreased breath sounds.  Clear to auscultation. CARDIAC:  Regular rate and rhythm without murmur. ABDOMEN:  Soft, nontender.  Good bowel sounds with PEG tube in place.  REHABILITATION HOSPITAL COURSE:  The patient was admitted to Inpatient Rehab Services with therapies initiated on a 3-hour daily basis, consisting of physical therapy, occupational therapy, speech therapy and rehabilitation nursing.  The following issues were addressed during the patient's rehabilitation stay.  Pertaining to Mr. Haack's, TBI, subdural hematoma, multiple facial fractures, he had undergone ORIF of multiple facial fractures, December 08, 2015, at [facility].  He would follow up at [facility] for this with arrangements per his wife. Subcutaneous Lovenox for DVT prophylaxis.  Venous Doppler studies negative.  He was using tramadol as needed for pain.  Mood, safety and awareness limited, remained on Ritalin as well as Seroquel with good results.  He  would follow up outpatient office.  He had been decannulated.  Diet advanced to a dysphagia 1 thin liquid.  PEG tube removed.  Blood pressure well controlled, on Norvasc and Coreg. Comminuted distal left radius fracture, nondisplaced right styloid fracture.   He had been advanced to weightbearing as tolerated per Orthopedic Service, Dr. Tamera Punt, who did see the patient at [facility], would follow up outpatient.  Bouts of urinary retention resolved with Flomax.  The patient received weekly collaborative interdisciplinary team conferences to discuss estimated length of stay, family teaching, any barriers to discharge.  He was ambulating supervision with need for assistance for his safety and decrease awareness, focus on attention, sequential processing and navigation of familiar environment.  He could gather his belongings for activities of daily living and home making, again needing assistance for his memory and attention.  His recall risk still remained limited, it was discussed at length the need for 24-hour supervision for his safety.  He demonstrated 100% accuracy with basic money counting tasks, given moderate verbal and visual cues.  Given a structural task for short-term recall.  The patient utilized written external aid in 50% of opportunities given moderate assist.  Full family teaching was completed and plan discharge to home.  DISCHARGE MEDICATIONS: 1. Norvasc 10 mg p.o. daily. 2. Coreg 12.5 mg p.o. b.i.d. 3. Colace 100 mg p.o. b.i.d. 4. Pepcid 20 mg p.o. b.i.d. 5. Ritalin 10 mg p.o. b.i.d. 6. Seroquel 12.5 mg p.o. at bedtime. 7. Flomax 0.4 mg p.o. at supper. 8. Ultram 50 mg p.o. every 6 hours as needed for pain.  DIET:  Dysphagia 1 thin liquid.  FOLLOWUP:  The patient would follow up with Dr. Alger Simons at the Outpatient Rehab Service office as directed; Dr. Tania Ade, Orthopedic Service, call for appointment; Dr. Miguel Aschoff, Medical Management.  The patient's wife was making arrangements to follow up outpatient with Oral Maxillary Surgery at [facility].  The patient was advised no driving.     Lauraine Rinne, P.A.   ______________________________ Meredith Staggers, M.D.    DA/MEDQ  D:   01/24/2016  T:  01/25/2016  Job:  [id number]  cc:   Miguel Aschoff, MD Tania Ade, MD

## 2016-01-26 ENCOUNTER — Telehealth: Payer: Self-pay | Admitting: *Deleted

## 2016-01-26 NOTE — Telephone Encounter (Signed)
Mary, RN, [facility] left a message reporting that pt was discharged from inpatient rehab and suffered to falls on his first night of being home. No further injuries reported. 1st fall was rolling out of bed and 2nd fall was controlled descent onto toilet. Nurse reports that wife is a very good caregiver who understands that pt requires hands on assist with functional mobility, transfers, and ambulation. Homehealth will continue to follow up and monitor. This is reported as required by [employer].......FYI

## 2016-01-27 NOTE — Telephone Encounter (Signed)
Because Dr. Naaman Plummer is out of the office could you answer these questions.   Patient called stating he has headaches would like to know if he could take ibuprofen and also could he take Flomax in the am instead of pm because of polyuria? Please advise.

## 2016-01-28 NOTE — Telephone Encounter (Signed)
Per Dr. Naaman Plummer (he is back at work), he can take up to 600 TID of Ibuprofen.  He can d/c flomax.

## 2016-01-28 NOTE — Telephone Encounter (Signed)
Patient has been notified, that he can take up to 600 TID of Ibuprofen and that he can d/c Flomax Per Dr. Naaman Plummer

## 2016-02-02 ENCOUNTER — Encounter: Payer: Self-pay | Admitting: Family Medicine

## 2016-02-02 ENCOUNTER — Ambulatory Visit (INDEPENDENT_AMBULATORY_CARE_PROVIDER_SITE_OTHER): Payer: 59 | Admitting: Family Medicine

## 2016-02-02 VITALS — BP 102/64 | HR 72 | Temp 97.8°F | Resp 16 | Wt 173.0 lb

## 2016-02-02 DIAGNOSIS — I10 Essential (primary) hypertension: Secondary | ICD-10-CM | POA: Diagnosis not present

## 2016-02-02 DIAGNOSIS — S0230XD Fracture of orbital floor, unspecified side, subsequent encounter for fracture with routine healing: Secondary | ICD-10-CM | POA: Diagnosis not present

## 2016-02-02 DIAGNOSIS — S069X3S Unspecified intracranial injury with loss of consciousness of 1 hour to 5 hours 59 minutes, sequela: Secondary | ICD-10-CM | POA: Diagnosis not present

## 2016-02-02 DIAGNOSIS — R1312 Dysphagia, oropharyngeal phase: Secondary | ICD-10-CM
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
## 2016-02-03 ENCOUNTER — Telehealth: Payer: Self-pay | Admitting: Family Medicine

## 2016-02-03 NOTE — Telephone Encounter (Signed)
Please review-aa 

## 2016-02-03 NOTE — Telephone Encounter (Signed)
Raquel Sarna from [place] called asking to get an extension on his PT.  She would like 3 more visits.  She also would like to get an order for out pt PT and speech Therapy at [facility] Out[facility] therapy.  Her call back [phone]  Her fax # for the out pt therapy order is [phone].  Thanks Con Memos

## 2016-02-04 NOTE — Telephone Encounter (Signed)
yes

## 2016-02-07 NOTE — Telephone Encounter (Signed)
Raquel Sarna advised and orders faxed over-aa

## 2016-02-09 DIAGNOSIS — S52514D Nondisplaced fracture of right radial styloid process, subsequent encounter for closed fracture with routine healing: Secondary | ICD-10-CM

## 2016-02-09 DIAGNOSIS — S020XXD Fracture of vault of skull, subsequent encounter for fracture with routine healing: Secondary | ICD-10-CM

## 2016-02-09 DIAGNOSIS — S069X0D Unspecified intracranial injury without loss of consciousness, subsequent encounter: Secondary | ICD-10-CM | POA: Diagnosis not present

## 2016-02-09 DIAGNOSIS — S0281XD Fracture of other specified skull and facial bones, right side, subsequent encounter for fracture with routine healing: Secondary | ICD-10-CM | POA: Diagnosis not present

## 2016-02-11 ENCOUNTER — Encounter: Payer: 59 | Attending: Physical Medicine & Rehabilitation | Admitting: Physical Medicine & Rehabilitation

## 2016-02-11 ENCOUNTER — Encounter: Payer: Self-pay | Admitting: Physical Medicine & Rehabilitation

## 2016-02-11 VITALS — BP 115/83 | HR 70

## 2016-02-11 DIAGNOSIS — F068 Other specified mental disorders due to known physiological condition: Secondary | ICD-10-CM | POA: Diagnosis not present

## 2016-02-11 DIAGNOSIS — Z818 Family history of other mental and behavioral disorders: Secondary | ICD-10-CM | POA: Insufficient documentation

## 2016-02-11 DIAGNOSIS — S0292XD Unspecified fracture of facial bones, subsequent encounter for fracture with routine healing: Secondary | ICD-10-CM | POA: Diagnosis present

## 2016-02-11 DIAGNOSIS — S06890D Other specified intracranial injury without loss of consciousness, subsequent encounter: Secondary | ICD-10-CM | POA: Insufficient documentation

## 2016-02-11 DIAGNOSIS — R4189 Other symptoms and signs involving cognitive functions and awareness: Secondary | ICD-10-CM | POA: Insufficient documentation

## 2016-02-11 DIAGNOSIS — S0292XS Unspecified fracture of facial bones, sequela: Secondary | ICD-10-CM

## 2016-02-11 DIAGNOSIS — W1789XD Other fall from one level to another, subsequent encounter: Secondary | ICD-10-CM | POA: Insufficient documentation

## 2016-02-11 DIAGNOSIS — N319 Neuromuscular dysfunction of bladder, unspecified: Secondary | ICD-10-CM | POA: Diagnosis not present

## 2016-02-11 DIAGNOSIS — Z8349 Family history of other endocrine, nutritional and metabolic diseases: Secondary | ICD-10-CM | POA: Insufficient documentation

## 2016-02-11 DIAGNOSIS — S52514D Nondisplaced fracture of right radial styloid process, subsequent encounter for closed fracture with routine healing: Secondary | ICD-10-CM | POA: Diagnosis not present

## 2016-02-11 DIAGNOSIS — R1310 Dysphagia, unspecified: Secondary | ICD-10-CM | POA: Insufficient documentation

## 2016-02-11 DIAGNOSIS — S069X0S Unspecified intracranial injury without loss of consciousness, sequela: Secondary | ICD-10-CM

## 2016-02-11 DIAGNOSIS — I10 Essential (primary) hypertension: Secondary | ICD-10-CM | POA: Diagnosis not present

## 2016-02-11 DIAGNOSIS — Z87891 Personal history of nicotine dependence: Secondary | ICD-10-CM | POA: Diagnosis not present

## 2016-02-11 DIAGNOSIS — S065X0D Traumatic subdural hemorrhage without loss of consciousness, subsequent encounter: Secondary | ICD-10-CM | POA: Diagnosis not present
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
## 2016-02-14 ENCOUNTER — Other Ambulatory Visit: Payer: Self-pay | Admitting: Neurology

## 2016-02-14 DIAGNOSIS — S069X3D Unspecified intracranial injury with loss of consciousness of 1 hour to 5 hours 59 minutes, subsequent encounter: Secondary | ICD-10-CM

## 2016-02-17 ENCOUNTER — Ambulatory Visit
Admission: RE | Admit: 2016-02-17 | Discharge: 2016-02-17 | Disposition: A | Discharge status: Home / Self Care | Payer: 59 | Source: Ambulatory Visit | Attending: Neurology | Admitting: Neurology

## 2016-02-17 DIAGNOSIS — G9389 Other specified disorders of brain: Secondary | ICD-10-CM | POA: Insufficient documentation

## 2016-02-17 DIAGNOSIS — S0281XD Fracture of other specified skull and facial bones, right side, subsequent encounter for fracture with routine healing: Secondary | ICD-10-CM | POA: Insufficient documentation

## 2016-02-17 DIAGNOSIS — G919 Hydrocephalus, unspecified: Secondary | ICD-10-CM | POA: Insufficient documentation

## 2016-02-17 DIAGNOSIS — X58XXXD Exposure to other specified factors, subsequent encounter: Secondary | ICD-10-CM | POA: Diagnosis not present

## 2016-02-17 DIAGNOSIS — S069X3D Unspecified intracranial injury with loss of consciousness of 1 hour to 5 hours 59 minutes, subsequent encounter: Secondary | ICD-10-CM | POA: Insufficient documentation

## 2016-02-18 ENCOUNTER — Other Ambulatory Visit: Payer: Self-pay

## 2016-02-18 NOTE — Telephone Encounter (Signed)
Patient would like a refill on his Ritalin. He said he will be out soon.  Please advise

## 2016-02-21 ENCOUNTER — Ambulatory Visit: Payer: 59 | Attending: Family Medicine | Admitting: Physical Therapy

## 2016-02-21 ENCOUNTER — Encounter: Payer: Self-pay | Admitting: Physical Therapy

## 2016-02-21 DIAGNOSIS — S52501A Unspecified fracture of the lower end of right radius, initial encounter for closed fracture: Secondary | ICD-10-CM | POA: Insufficient documentation

## 2016-02-21 DIAGNOSIS — S0292XA Unspecified fracture of facial bones, initial encounter for closed fracture: Secondary | ICD-10-CM | POA: Insufficient documentation

## 2016-02-21 DIAGNOSIS — S52502A Unspecified fracture of the lower end of left radius, initial encounter for closed fracture: Secondary | ICD-10-CM | POA: Insufficient documentation

## 2016-02-21 DIAGNOSIS — R1312 Dysphagia, oropharyngeal phase: Secondary | ICD-10-CM | POA: Insufficient documentation

## 2016-02-21 DIAGNOSIS — W19XXXA Unspecified fall, initial encounter: Secondary | ICD-10-CM | POA: Insufficient documentation

## 2016-02-21 DIAGNOSIS — M6281 Muscle weakness (generalized): Secondary | ICD-10-CM | POA: Diagnosis not present

## 2016-02-21 DIAGNOSIS — R262 Difficulty in walking, not elsewhere classified: Secondary | ICD-10-CM | POA: Insufficient documentation

## 2016-02-21 DIAGNOSIS — S069X9A Unspecified intracranial injury with loss of consciousness of unspecified duration, initial encounter: Secondary | ICD-10-CM | POA: Diagnosis not present

## 2016-02-21 MED ORDER — METHYLPHENIDATE HCL 10 MG PO TABS: 10.0000 mg | ORAL_TABLET | Freq: Two times a day (BID) | ORAL | 0 refills | Status: DC
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
## 2016-02-21 NOTE — Telephone Encounter (Signed)
Patient has been notified

## 2016-02-21 NOTE — Telephone Encounter (Signed)
Ritalin RF/printed out

## 2016-02-23 ENCOUNTER — Ambulatory Visit: Payer: 59 | Admitting: Physical Therapy

## 2016-02-23 ENCOUNTER — Encounter: Payer: Self-pay | Admitting: Physical Therapy

## 2016-02-23 VITALS — BP 114/79

## 2016-02-23 DIAGNOSIS — M6281 Muscle weakness (generalized): Secondary | ICD-10-CM

## 2016-02-23 DIAGNOSIS — R262 Difficulty in walking, not elsewhere classified: Secondary | ICD-10-CM
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
## 2016-02-28 ENCOUNTER — Ambulatory Visit: Payer: 59 | Admitting: Physical Therapy

## 2016-02-29 ENCOUNTER — Encounter: Payer: Self-pay | Admitting: Occupational Therapy

## 2016-02-29 ENCOUNTER — Ambulatory Visit: Payer: 59 | Admitting: Occupational Therapy

## 2016-02-29 DIAGNOSIS — S52352S Displaced comminuted fracture of shaft of radius, left arm, sequela: Secondary | ICD-10-CM

## 2016-02-29 DIAGNOSIS — S069X3S Unspecified intracranial injury with loss of consciousness of 1 hour to 5 hours 59 minutes, sequela: Secondary | ICD-10-CM

## 2016-02-29 DIAGNOSIS — S52514A Nondisplaced fracture of right radial styloid process, initial encounter for closed fracture: Secondary | ICD-10-CM

## 2016-02-29 DIAGNOSIS — M6281 Muscle weakness (generalized): Secondary | ICD-10-CM | POA: Diagnosis not present

## 2016-02-29 DIAGNOSIS — R278 Other lack of coordination: Secondary | ICD-10-CM

## 2016-03-01 ENCOUNTER — Encounter: Payer: 59 | Admitting: Occupational Therapy

## 2016-03-02 ENCOUNTER — Ambulatory Visit: Payer: 59 | Admitting: Occupational Therapy

## 2016-03-02 ENCOUNTER — Ambulatory Visit: Payer: 59 | Admitting: Physical Therapy

## 2016-03-02 ENCOUNTER — Encounter: Payer: Self-pay | Admitting: Physical Therapy

## 2016-03-02 DIAGNOSIS — R278 Other lack of coordination: Secondary | ICD-10-CM

## 2016-03-02 DIAGNOSIS — M6281 Muscle weakness (generalized): Secondary | ICD-10-CM | POA: Diagnosis not present
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
## 2016-03-07 ENCOUNTER — Ambulatory Visit: Payer: 59 | Admitting: Physical Therapy

## 2016-03-07 ENCOUNTER — Encounter: Payer: 59 | Admitting: Occupational Therapy

## 2016-03-07 ENCOUNTER — Ambulatory Visit: Payer: 59 | Admitting: Speech Pathology

## 2016-03-07 ENCOUNTER — Encounter: Payer: Self-pay | Admitting: Physical Therapy

## 2016-03-07 ENCOUNTER — Ambulatory Visit: Payer: 59 | Attending: Family Medicine | Admitting: Physical Therapy

## 2016-03-07 VITALS — BP 109/76 | HR 70

## 2016-03-07 DIAGNOSIS — S0292XA Unspecified fracture of facial bones, initial encounter for closed fracture: Secondary | ICD-10-CM | POA: Diagnosis not present

## 2016-03-07 DIAGNOSIS — R1312 Dysphagia, oropharyngeal phase: Secondary | ICD-10-CM | POA: Diagnosis not present

## 2016-03-07 DIAGNOSIS — S52501A Unspecified fracture of the lower end of right radius, initial encounter for closed fracture: Secondary | ICD-10-CM | POA: Insufficient documentation

## 2016-03-07 DIAGNOSIS — S52502A Unspecified fracture of the lower end of left radius, initial encounter for closed fracture: Secondary | ICD-10-CM | POA: Insufficient documentation

## 2016-03-07 DIAGNOSIS — R41841 Cognitive communication deficit: Secondary | ICD-10-CM

## 2016-03-07 DIAGNOSIS — W19XXXA Unspecified fall, initial encounter: Secondary | ICD-10-CM | POA: Diagnosis not present

## 2016-03-07 DIAGNOSIS — S069X3S Unspecified intracranial injury with loss of consciousness of 1 hour to 5 hours 59 minutes, sequela: Secondary | ICD-10-CM | POA: Insufficient documentation

## 2016-03-07 DIAGNOSIS — R278 Other lack of coordination: Secondary | ICD-10-CM

## 2016-03-07 DIAGNOSIS — M6281 Muscle weakness (generalized): Secondary | ICD-10-CM
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
## 2016-03-08 ENCOUNTER — Encounter: Payer: Self-pay | Admitting: Speech Pathology
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
## 2016-03-09 ENCOUNTER — Ambulatory Visit: Payer: 59

## 2016-03-09 ENCOUNTER — Ambulatory Visit: Payer: 59 | Admitting: Speech Pathology

## 2016-03-09 ENCOUNTER — Encounter: Payer: 59 | Admitting: Occupational Therapy

## 2016-03-09 ENCOUNTER — Ambulatory Visit: Payer: 59 | Admitting: Occupational Therapy

## 2016-03-09 DIAGNOSIS — R41841 Cognitive communication deficit: Secondary | ICD-10-CM

## 2016-03-09 DIAGNOSIS — R278 Other lack of coordination: Secondary | ICD-10-CM

## 2016-03-09 DIAGNOSIS — M6281 Muscle weakness (generalized): Secondary | ICD-10-CM

## 2016-03-10 ENCOUNTER — Encounter: Payer: Self-pay | Admitting: Speech Pathology
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
## 2016-03-10 NOTE — Patient Instructions (Signed)
OT TREATMENT    Neuro muscular re-education:  Pt. performed [facility] tasks using the Grooved pegboard. Pt. worked on grasping the grooved pegs from a horizontal position, and moving the pegs to a vertical position in the hand to prepare for placing them in the grooved slot. Pt. Required cues for manipulating the pegs. Pt. Required cues for redirection.  Therapeutic Exercise:  Pt. Worked on pinch strengthening in the right hand for lateral, and 3pt. pinch using yellow, red, green, and blue resistive clips. Pt. worked on placing the clips at various vertical and horizontal angles. Tactile and verbal cues were required for eliciting the desired movement. Pt. Worked on visual horizontal visual scanning when placing the clips. Pt. Required cues for redirection.

## 2016-03-14 ENCOUNTER — Ambulatory Visit: Payer: 59 | Admitting: Occupational Therapy

## 2016-03-14 ENCOUNTER — Ambulatory Visit: Payer: 59

## 2016-03-14 ENCOUNTER — Encounter: Payer: 59 | Admitting: Occupational Therapy

## 2016-03-14 ENCOUNTER — Encounter: Payer: Self-pay | Admitting: Speech Pathology

## 2016-03-14 ENCOUNTER — Encounter: Payer: 59 | Admitting: Speech Pathology

## 2016-03-14 ENCOUNTER — Ambulatory Visit: Payer: 59 | Admitting: Speech Pathology

## 2016-03-14 DIAGNOSIS — R278 Other lack of coordination: Secondary | ICD-10-CM

## 2016-03-14 DIAGNOSIS — R41841 Cognitive communication deficit: Secondary | ICD-10-CM | POA: Diagnosis not present

## 2016-03-14 DIAGNOSIS — M6281 Muscle weakness (generalized): Secondary | ICD-10-CM

## 2016-03-14 DIAGNOSIS — S069X3S Unspecified intracranial injury with loss of consciousness of 1 hour to 5 hours 59 minutes, sequela: Secondary | ICD-10-CM

## 2016-03-14 DIAGNOSIS — R262 Difficulty in walking, not elsewhere classified: Secondary | ICD-10-CM
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
## 2016-03-16 ENCOUNTER — Encounter: Payer: 59 | Admitting: Speech Pathology

## 2016-03-16 ENCOUNTER — Ambulatory Visit: Payer: 59

## 2016-03-17 ENCOUNTER — Encounter: Payer: Self-pay | Admitting: Speech Pathology

## 2016-03-17 ENCOUNTER — Ambulatory Visit: Payer: 59

## 2016-03-17 ENCOUNTER — Encounter: Payer: Self-pay | Admitting: Occupational Therapy

## 2016-03-17 ENCOUNTER — Ambulatory Visit: Payer: 59 | Admitting: Occupational Therapy

## 2016-03-17 ENCOUNTER — Ambulatory Visit: Payer: 59 | Admitting: Speech Pathology

## 2016-03-17 DIAGNOSIS — M6281 Muscle weakness (generalized): Secondary | ICD-10-CM

## 2016-03-17 DIAGNOSIS — R278 Other lack of coordination: Secondary | ICD-10-CM

## 2016-03-17 DIAGNOSIS — R262 Difficulty in walking, not elsewhere classified: Secondary | ICD-10-CM

## 2016-03-17 DIAGNOSIS — R41841 Cognitive communication deficit: Secondary | ICD-10-CM

## 2016-03-17 DIAGNOSIS — S52352S Displaced comminuted fracture of shaft of radius, left arm, sequela: Secondary | ICD-10-CM

## 2016-03-17 DIAGNOSIS — S52514A Nondisplaced fracture of right radial styloid process, initial encounter for closed fracture: Secondary | ICD-10-CM

## 2016-03-17 DIAGNOSIS — S069X3S Unspecified intracranial injury with loss of consciousness of 1 hour to 5 hours 59 minutes, sequela: Secondary | ICD-10-CM
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
## 2016-03-21 ENCOUNTER — Ambulatory Visit: Payer: 59

## 2016-03-21 ENCOUNTER — Encounter: Payer: Self-pay | Admitting: Occupational Therapy

## 2016-03-21 ENCOUNTER — Telehealth: Payer: Self-pay | Admitting: Physical Medicine & Rehabilitation

## 2016-03-21 ENCOUNTER — Encounter: Payer: 59 | Admitting: Speech Pathology

## 2016-03-21 ENCOUNTER — Ambulatory Visit: Payer: 59 | Admitting: Speech Pathology

## 2016-03-21 ENCOUNTER — Other Ambulatory Visit: Payer: Self-pay | Admitting: *Deleted

## 2016-03-21 ENCOUNTER — Ambulatory Visit: Payer: 59 | Admitting: Occupational Therapy

## 2016-03-21 DIAGNOSIS — M6281 Muscle weakness (generalized): Secondary | ICD-10-CM

## 2016-03-21 DIAGNOSIS — R41841 Cognitive communication deficit: Secondary | ICD-10-CM | POA: Diagnosis not present

## 2016-03-21 DIAGNOSIS — R278 Other lack of coordination: Secondary | ICD-10-CM

## 2016-03-21 DIAGNOSIS — R262 Difficulty in walking, not elsewhere classified: Secondary | ICD-10-CM

## 2016-03-21 DIAGNOSIS — S52352S Displaced comminuted fracture of shaft of radius, left arm, sequela: Secondary | ICD-10-CM

## 2016-03-21 DIAGNOSIS — S52514A Nondisplaced fracture of right radial styloid process, initial encounter for closed fracture: Secondary | ICD-10-CM

## 2016-03-21 DIAGNOSIS — S069X3S Unspecified intracranial injury with loss of consciousness of 1 hour to 5 hours 59 minutes, sequela: Secondary | ICD-10-CM
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
## 2016-03-21 NOTE — Telephone Encounter (Signed)
Returning Ken's call to let him know that the Ritalin runs out Saturday.  Please call her back.

## 2016-03-21 NOTE — Telephone Encounter (Signed)
Talked to patients wife, she will not be able to pick up till 03/23/2016. Have Dr. Posey Pronto address 03/22/2016

## 2016-03-21 NOTE — Telephone Encounter (Signed)
Patient's wife called asking for a refill on methylphenidate. He is going to run out 03/22/2016. He is a fairly recent Dr. Naaman Plummer hospital discharge who was initially seen by Dr. Posey Pronto on his first follow up visit. Please advise

## 2016-03-21 NOTE — Telephone Encounter (Signed)
He can have a refill until his follow up appointment with Dr. Naaman Plummer.  Thanks.

## 2016-03-22 ENCOUNTER — Encounter: Payer: Self-pay | Admitting: Occupational Therapy

## 2016-03-22 MED ORDER — METHYLPHENIDATE HCL 10 MG PO TABS: 10.0000 mg | ORAL_TABLET | Freq: Two times a day (BID) | ORAL | 0 refills | Status: DC
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
## 2016-03-24 ENCOUNTER — Ambulatory Visit: Payer: 59

## 2016-03-24 ENCOUNTER — Encounter: Payer: Self-pay | Admitting: Occupational Therapy

## 2016-03-24 ENCOUNTER — Ambulatory Visit: Payer: 59 | Admitting: Occupational Therapy

## 2016-03-24 ENCOUNTER — Encounter: Payer: Self-pay | Admitting: Speech Pathology

## 2016-03-24 ENCOUNTER — Ambulatory Visit: Payer: 59 | Admitting: Speech Pathology

## 2016-03-24 DIAGNOSIS — M6281 Muscle weakness (generalized): Secondary | ICD-10-CM

## 2016-03-24 DIAGNOSIS — R41841 Cognitive communication deficit: Secondary | ICD-10-CM

## 2016-03-24 DIAGNOSIS — R278 Other lack of coordination: Secondary | ICD-10-CM

## 2016-03-24 DIAGNOSIS — R262 Difficulty in walking, not elsewhere classified: Secondary | ICD-10-CM
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
## 2016-03-29 ENCOUNTER — Ambulatory Visit: Payer: 59

## 2016-03-29 ENCOUNTER — Other Ambulatory Visit: Payer: Self-pay | Admitting: Family Medicine

## 2016-03-29 ENCOUNTER — Ambulatory Visit: Payer: 59 | Admitting: Occupational Therapy

## 2016-03-29 DIAGNOSIS — R41841 Cognitive communication deficit: Secondary | ICD-10-CM | POA: Diagnosis not present

## 2016-03-29 DIAGNOSIS — R278 Other lack of coordination: Secondary | ICD-10-CM

## 2016-03-29 DIAGNOSIS — M6281 Muscle weakness (generalized): Secondary | ICD-10-CM

## 2016-03-29 DIAGNOSIS — R262 Difficulty in walking, not elsewhere classified: Secondary | ICD-10-CM

## 2016-03-29 MED ORDER — CARVEDILOL 12.5 MG PO TABS: 12.5000 mg | ORAL_TABLET | Freq: Two times a day (BID) | ORAL | 3 refills | Status: DC
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
## 2016-03-29 NOTE — Telephone Encounter (Signed)
Pt contacted office for refill request on the following medications:  carvedilol (COREG) 12.5 MG tablet.  [place]  CB#[phone]/MW  This is a pt of Dr Rosanna Randy.  Pt wife states this was a Rx he rec'd while either in the hospital or in [place] in [place]

## 2016-03-29 NOTE — Patient Instructions (Signed)
OT TREATMENT    Neuro muscular re-education:  Pt. Worked on grasping 1" resistive cubes with thumb opposition to the tip of his 2nd through 5th digits. The resistive cubes were placed on a vertical angle to encourage wrist extension.  Therapeutic Exercise:  Pt. Worked on fisting exercises with his RUE with visual demonstration, verbal, and visual cues. Pt. performed gross gripping with grip strengthener. Pt. worked on sustaining grip while grasping pegs and reaching at various heights. Gripper was placed in the 2nd resistive slot with the white resistive spring.

## 2016-03-29 NOTE — Telephone Encounter (Signed)
Please advise 
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
## 2016-03-31 ENCOUNTER — Ambulatory Visit: Payer: 59 | Admitting: Speech Pathology

## 2016-03-31 ENCOUNTER — Encounter: Payer: Self-pay | Admitting: Speech Pathology

## 2016-03-31 ENCOUNTER — Ambulatory Visit: Payer: 59

## 2016-03-31 ENCOUNTER — Encounter: Payer: 59 | Admitting: Occupational Therapy

## 2016-03-31 DIAGNOSIS — R41841 Cognitive communication deficit: Secondary | ICD-10-CM

## 2016-03-31 DIAGNOSIS — M6281 Muscle weakness (generalized): Secondary | ICD-10-CM

## 2016-03-31 DIAGNOSIS — R262 Difficulty in walking, not elsewhere classified: Secondary | ICD-10-CM

## 2016-03-31 DIAGNOSIS — R278 Other lack of coordination: Secondary | ICD-10-CM
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
## 2016-04-04 ENCOUNTER — Ambulatory Visit: Payer: 59

## 2016-04-04 ENCOUNTER — Ambulatory Visit: Payer: 59 | Attending: Family Medicine | Admitting: Speech Pathology

## 2016-04-04 ENCOUNTER — Ambulatory Visit: Payer: 59 | Admitting: Occupational Therapy

## 2016-04-04 ENCOUNTER — Encounter: Payer: Self-pay | Admitting: Speech Pathology

## 2016-04-04 DIAGNOSIS — M6281 Muscle weakness (generalized): Secondary | ICD-10-CM

## 2016-04-04 DIAGNOSIS — R262 Difficulty in walking, not elsewhere classified: Secondary | ICD-10-CM | POA: Diagnosis not present

## 2016-04-04 DIAGNOSIS — S52352S Displaced comminuted fracture of shaft of radius, left arm, sequela: Secondary | ICD-10-CM | POA: Insufficient documentation

## 2016-04-04 DIAGNOSIS — R278 Other lack of coordination: Secondary | ICD-10-CM

## 2016-04-04 DIAGNOSIS — W1789XS Other fall from one level to another, sequela: Secondary | ICD-10-CM | POA: Insufficient documentation

## 2016-04-04 DIAGNOSIS — R41841 Cognitive communication deficit: Secondary | ICD-10-CM | POA: Diagnosis not present

## 2016-04-04 DIAGNOSIS — S069X3S Unspecified intracranial injury with loss of consciousness of 1 hour to 5 hours 59 minutes, sequela: Secondary | ICD-10-CM | POA: Diagnosis not present

## 2016-04-04 DIAGNOSIS — S52514S Nondisplaced fracture of right radial styloid process, sequela: Secondary | ICD-10-CM | POA: Insufficient documentation
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
## 2016-04-05 ENCOUNTER — Ambulatory Visit
Admission: RE | Admit: 2016-04-05 | Discharge: 2016-04-05 | Disposition: A | Discharge status: Home / Self Care | Payer: 59 | Source: Ambulatory Visit | Attending: Family Medicine | Admitting: Family Medicine

## 2016-04-05 ENCOUNTER — Ambulatory Visit (INDEPENDENT_AMBULATORY_CARE_PROVIDER_SITE_OTHER): Payer: 59 | Admitting: Family Medicine

## 2016-04-05 VITALS — BP 128/78 | HR 66 | Temp 97.8°F | Resp 16 | Wt 194.0 lb

## 2016-04-05 DIAGNOSIS — S0230XD Fracture of orbital floor, unspecified side, subsequent encounter for fracture with routine healing: Secondary | ICD-10-CM | POA: Diagnosis not present

## 2016-04-05 DIAGNOSIS — M19011 Primary osteoarthritis, right shoulder: Secondary | ICD-10-CM

## 2016-04-05 DIAGNOSIS — Z23 Encounter for immunization: Secondary | ICD-10-CM

## 2016-04-05 DIAGNOSIS — S069X3S Unspecified intracranial injury with loss of consciousness of 1 hour to 5 hours 59 minutes, sequela: Secondary | ICD-10-CM

## 2016-04-05 DIAGNOSIS — I10 Essential (primary) hypertension: Secondary | ICD-10-CM | POA: Diagnosis not present

## 2016-04-05 DIAGNOSIS — Z6825 Body mass index (BMI) 25.0-25.9, adult: Secondary | ICD-10-CM

## 2016-04-05 MED ORDER — CARVEDILOL 12.5 MG PO TABS: 12.5000 mg | ORAL_TABLET | Freq: Two times a day (BID) | ORAL | 3 refills | Status: DC

## 2016-04-05 MED ORDER — NAPROXEN 500 MG PO TABS
500.0000 mg | ORAL_TABLET | Freq: Two times a day (BID) | ORAL | 0 refills | Status: DC
Start: 2016-04-05 — End: 2017-05-16
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
## 2016-04-05 NOTE — Patient Instructions (Signed)
OT TREATMENT    Neuro muscular re-education:  Pt. Worked on grasping, flipping, and stacking [place] style discs. Pt worked on improving, and controlling movements with his right hand, with increasing speed. Pt. Worked on grasping coins from a tabletop surface, placing them into a resistive container, and pushing them through the slot while isolating his 2nd digit. Pt. Was unable to grasp the coins off a flat tabletop surface, and slid them off the surface with reversing the thumb.  Therapeutic Exercise:  Pt. Worked on fisting exercises with the right hand. Pt. performed gross gripping with grip strengthener. Pt. worked on sustaining grip while grasping pegs and reaching at various heights. Gripper was placed in the resistive slot with the white 2nd, and 3rd resistive spring. Pt. Worked on pinch strengthening in the left hand for lateral, and 3pt. pinch using yellow, red, and green resistive clips.. Pt. worked on placing the clips at various vertical and horizontal angles. Tactile and verbal cues were required for eliciting the desired movement.

## 2016-04-06 ENCOUNTER — Ambulatory Visit: Payer: 59

## 2016-04-06 ENCOUNTER — Ambulatory Visit: Payer: 59 | Admitting: Speech Pathology

## 2016-04-06 ENCOUNTER — Ambulatory Visit: Payer: 59 | Admitting: Occupational Therapy

## 2016-04-06 DIAGNOSIS — R262 Difficulty in walking, not elsewhere classified: Secondary | ICD-10-CM

## 2016-04-06 DIAGNOSIS — R278 Other lack of coordination: Secondary | ICD-10-CM

## 2016-04-06 DIAGNOSIS — R41841 Cognitive communication deficit: Secondary | ICD-10-CM

## 2016-04-06 DIAGNOSIS — M6281 Muscle weakness (generalized): Secondary | ICD-10-CM | POA: Diagnosis not present

## 2016-04-06 NOTE — Patient Instructions (Signed)
OT TREATMENT    Neuro muscular re-education:  Pt. performed [facility] skills training to improve speed and dexterity needed for ADL tasks and writing. Pt. demonstrated grasping 1 inch sticks,  inch cylindrical collars, and  inch flat washers on the [place] pegboard. Pt. performed grasping each item with her 2nd digit and thumb. Pt. worked on tasks to sustain lateral pinch on resistive tweezers while grasping and moving 2" toothpick sticks from a horizontal flat position to a vertical position in order to place it in the holder. Pt. was able to sustain grasp while positioning and extending the wrist/hand in the necessary alignment needed to place the stick through the top of the holder. Pt. Required cues for hand movements. Pt. presented with difficulty storing  inch objects at a time in the palmar aspect of the hand. Pt. Worked on bilateral hand coordination, and alternating hand movements.  Therapeutic Exercise:  Pt. Worked on Right hand ROM, and fisting exercises. Pt. Worked on pinch strengthening in the left hand for lateral, and 3pt. pinch using yellow, red, and green resistive clips. Pt. worked on placing the clips at various vertical and horizontal angles. Tactile and verbal cues were required for eliciting the desired movement.
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
## 2016-04-07 ENCOUNTER — Encounter: Payer: Self-pay | Admitting: Speech Pathology
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
## 2016-04-11 ENCOUNTER — Ambulatory Visit: Payer: 59

## 2016-04-11 ENCOUNTER — Ambulatory Visit: Payer: 59 | Admitting: Occupational Therapy

## 2016-04-11 ENCOUNTER — Encounter: Payer: Self-pay | Admitting: Occupational Therapy

## 2016-04-11 ENCOUNTER — Ambulatory Visit: Payer: 59 | Admitting: Speech Pathology

## 2016-04-11 DIAGNOSIS — M6281 Muscle weakness (generalized): Secondary | ICD-10-CM

## 2016-04-11 DIAGNOSIS — R278 Other lack of coordination: Secondary | ICD-10-CM

## 2016-04-11 DIAGNOSIS — R262 Difficulty in walking, not elsewhere classified: Secondary | ICD-10-CM

## 2016-04-11 DIAGNOSIS — R41841 Cognitive communication deficit: Secondary | ICD-10-CM

## 2016-04-11 NOTE — Patient Instructions (Signed)
OT TREATMENT    Neuro muscular re-education:  Pt. worked on flipping cards with the right hand in alternating patterns of thumb on fingers, fingers on thumb. Pt. worked on grasping 1" resistive cubes placed at a vertical angle. Pt. Worked on placing them back while isolating alternating with his 2nd through 5th digit.  Therapeutic Exercise:  Pt. Worked on fisting exercises with cues with the right hand. Pt. performed gross gripping with grip strengthener. Pt. worked on sustaining grip while grasping pegs and reaching at various heights. Gripper was placed in the 3rd resistive slot with the white resistive spring.
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
## 2016-04-12 ENCOUNTER — Encounter: Payer: 59 | Attending: Physical Medicine & Rehabilitation | Admitting: Physical Medicine & Rehabilitation

## 2016-04-12 ENCOUNTER — Encounter: Payer: Self-pay | Admitting: Physical Medicine & Rehabilitation

## 2016-04-12 ENCOUNTER — Encounter: Payer: Self-pay | Admitting: Speech Pathology

## 2016-04-12 VITALS — BP 119/78 | HR 84 | Resp 14

## 2016-04-12 DIAGNOSIS — S0292XD Unspecified fracture of facial bones, subsequent encounter for fracture with routine healing: Secondary | ICD-10-CM | POA: Insufficient documentation

## 2016-04-12 DIAGNOSIS — S06890D Other specified intracranial injury without loss of consciousness, subsequent encounter: Secondary | ICD-10-CM | POA: Insufficient documentation

## 2016-04-12 DIAGNOSIS — F5101 Primary insomnia: Secondary | ICD-10-CM | POA: Diagnosis not present

## 2016-04-12 DIAGNOSIS — R4189 Other symptoms and signs involving cognitive functions and awareness: Secondary | ICD-10-CM | POA: Diagnosis not present

## 2016-04-12 DIAGNOSIS — S069X3S Unspecified intracranial injury with loss of consciousness of 1 hour to 5 hours 59 minutes, sequela: Secondary | ICD-10-CM | POA: Diagnosis not present

## 2016-04-12 DIAGNOSIS — S52514D Nondisplaced fracture of right radial styloid process, subsequent encounter for closed fracture with routine healing: Secondary | ICD-10-CM | POA: Diagnosis not present

## 2016-04-12 DIAGNOSIS — I10 Essential (primary) hypertension: Secondary | ICD-10-CM | POA: Diagnosis not present

## 2016-04-12 DIAGNOSIS — N319 Neuromuscular dysfunction of bladder, unspecified: Secondary | ICD-10-CM | POA: Diagnosis not present

## 2016-04-12 DIAGNOSIS — R1310 Dysphagia, unspecified: Secondary | ICD-10-CM | POA: Insufficient documentation

## 2016-04-12 DIAGNOSIS — Z8349 Family history of other endocrine, nutritional and metabolic diseases: Secondary | ICD-10-CM | POA: Insufficient documentation

## 2016-04-12 DIAGNOSIS — Z87891 Personal history of nicotine dependence: Secondary | ICD-10-CM | POA: Diagnosis not present

## 2016-04-12 DIAGNOSIS — Z818 Family history of other mental and behavioral disorders: Secondary | ICD-10-CM | POA: Insufficient documentation

## 2016-04-12 MED ORDER — METHYLPHENIDATE HCL 10 MG PO TABS: 10.0000 mg | ORAL_TABLET | Freq: Two times a day (BID) | ORAL | 0 refills | Status: DC

## 2016-04-12 MED ORDER — TRAZODONE HCL 50 MG PO TABS: 50.0000 mg | ORAL_TABLET | Freq: Every day | ORAL | 2 refills | Status: DC
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
## 2016-04-12 NOTE — Patient Instructions (Addendum)
1. LATER RITALIN DOSE----2PM OR SO 2. MID DAY NAP 30" OR SO 3. TRAZODONE AT NIGHT---TARGET AT EARLIEST 8PM SLEEPING TIME 4. TRIALS OF DRIVING ON EMPTY STREET/PARKING LOT 5. LIBERALIZE TIME ALONE SLOWLY BUT SURELY   PLEASE CALL ME WITH ANY PROBLEMS OR QUESTIONS [phone])
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
## 2016-04-13 ENCOUNTER — Ambulatory Visit: Payer: 59 | Admitting: Occupational Therapy

## 2016-04-13 ENCOUNTER — Ambulatory Visit: Payer: 59

## 2016-04-13 ENCOUNTER — Ambulatory Visit: Payer: 59 | Admitting: Speech Pathology

## 2016-04-13 DIAGNOSIS — R262 Difficulty in walking, not elsewhere classified: Secondary | ICD-10-CM

## 2016-04-13 DIAGNOSIS — R278 Other lack of coordination: Secondary | ICD-10-CM

## 2016-04-13 DIAGNOSIS — M6281 Muscle weakness (generalized): Secondary | ICD-10-CM | POA: Diagnosis not present

## 2016-04-13 DIAGNOSIS — R41841 Cognitive communication deficit: Secondary | ICD-10-CM
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
## 2016-04-14 ENCOUNTER — Encounter: Payer: Self-pay | Admitting: Speech Pathology
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
## 2016-04-16 NOTE — Patient Instructions (Addendum)
OT TREATMENT    Neuro muscular re-education:  Pt. performed [facility] tasks using the Grooved pegboard. Pt. worked on grasping the grooved pegs from a horizontal position, and moving the pegs to a vertical position in the hand to prepare for placing them in the grooved slot. Pt. had difficulty performing translatory movements with the right hand.   Therapeutic Exercise:  Pt. worked on formulating a composite fist, and intrinsic fisting exercises with the right hand. Pt. Requires visual demonstration, visual, and verbal cues. Pt. performed gross gripping with grip strengthener. Pt. worked on sustaining grip while grasping pegs and reaching at various heights. Gripper was placed in the 2nd and 3rd resistive slot with the white resistive spring. Pt. worked on pinch strengthening in the left hand for lateral, and 3pt. pinch using yellow, red, green resistive clips. Pt. worked on placing the clips at various vertical and horizontal angles. Tactile and verbal cues were required for eliciting the desired movement. Pt. requires cues for redirection.

## 2016-04-18 ENCOUNTER — Ambulatory Visit: Payer: 59 | Admitting: Occupational Therapy

## 2016-04-18 ENCOUNTER — Ambulatory Visit: Payer: 59

## 2016-04-18 ENCOUNTER — Encounter: Payer: Self-pay | Admitting: Occupational Therapy

## 2016-04-18 DIAGNOSIS — R278 Other lack of coordination: Secondary | ICD-10-CM

## 2016-04-18 DIAGNOSIS — M6281 Muscle weakness (generalized): Secondary | ICD-10-CM

## 2016-04-18 DIAGNOSIS — R262 Difficulty in walking, not elsewhere classified: Secondary | ICD-10-CM
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
## 2016-04-18 NOTE — Patient Instructions (Signed)
OT TREATMENT    Neuro muscular re-education:  Pt. worked on grasping small 1/2" pegs, and placing the pegs in the pegboard following a pattern design. Pt. Worked on grasping a placing 1" resistive cubes from a vertical angle alternating thumb opposition from the 2nd through 5th digits.  Therapeutic Exercise:  Pt. worked on fisting exercises in the right hand with intrinsic fisting exercises. Pt. requires verbal cues and visual demonstration for the fisting sequence. Pt. performed gross gripping with grip strengthener. Pt. worked on sustaining grip while grasping pegs and reaching at various heights. Gripper was placed in the resistive slot with the white resistive spring. Pt. worked on pinch strengthening in the left hand for lateral, and 3pt. pinch using yellow, red, and green resistive clips. Pt. worked on placing the clips at a horizontal angle. Tactile and verbal cues were required for eliciting the desired movement.

## 2016-04-21 ENCOUNTER — Ambulatory Visit: Payer: 59 | Admitting: Speech Pathology

## 2016-04-21 ENCOUNTER — Ambulatory Visit: Payer: 59 | Admitting: Occupational Therapy

## 2016-04-21 ENCOUNTER — Encounter: Payer: Self-pay | Admitting: Speech Pathology

## 2016-04-21 ENCOUNTER — Ambulatory Visit: Payer: 59

## 2016-04-21 DIAGNOSIS — S52514A Nondisplaced fracture of right radial styloid process, initial encounter for closed fracture: Secondary | ICD-10-CM

## 2016-04-21 DIAGNOSIS — R278 Other lack of coordination: Secondary | ICD-10-CM

## 2016-04-21 DIAGNOSIS — M6281 Muscle weakness (generalized): Secondary | ICD-10-CM

## 2016-04-21 DIAGNOSIS — S069X3S Unspecified intracranial injury with loss of consciousness of 1 hour to 5 hours 59 minutes, sequela: Secondary | ICD-10-CM

## 2016-04-21 DIAGNOSIS — R262 Difficulty in walking, not elsewhere classified: Secondary | ICD-10-CM

## 2016-04-21 DIAGNOSIS — R41841 Cognitive communication deficit: Secondary | ICD-10-CM
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
## 2016-04-22 ENCOUNTER — Encounter: Payer: Self-pay | Admitting: Occupational Therapy
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
## 2016-04-25 ENCOUNTER — Ambulatory Visit: Payer: 59 | Admitting: Occupational Therapy

## 2016-04-25 ENCOUNTER — Ambulatory Visit: Payer: 59 | Admitting: Speech Pathology

## 2016-04-25 ENCOUNTER — Ambulatory Visit: Payer: 59

## 2016-04-25 DIAGNOSIS — M6281 Muscle weakness (generalized): Secondary | ICD-10-CM

## 2016-04-25 DIAGNOSIS — R262 Difficulty in walking, not elsewhere classified: Secondary | ICD-10-CM

## 2016-04-25 DIAGNOSIS — R278 Other lack of coordination: Secondary | ICD-10-CM

## 2016-04-25 DIAGNOSIS — R41841 Cognitive communication deficit: Secondary | ICD-10-CM
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
## 2016-04-26 ENCOUNTER — Encounter: Payer: Self-pay | Admitting: Speech Pathology
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
## 2016-04-27 ENCOUNTER — Encounter: Payer: 59 | Admitting: Speech Pathology

## 2016-04-27 ENCOUNTER — Ambulatory Visit: Payer: 59

## 2016-04-27 ENCOUNTER — Encounter: Payer: 59 | Admitting: Occupational Therapy

## 2016-05-01 ENCOUNTER — Encounter: Payer: Self-pay | Admitting: Occupational Therapy
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
## 2016-05-02 ENCOUNTER — Ambulatory Visit: Payer: 59

## 2016-05-02 ENCOUNTER — Ambulatory Visit: Payer: 59 | Admitting: Occupational Therapy

## 2016-05-02 ENCOUNTER — Ambulatory Visit: Payer: 59 | Admitting: Speech Pathology

## 2016-05-02 DIAGNOSIS — M6281 Muscle weakness (generalized): Secondary | ICD-10-CM | POA: Diagnosis not present

## 2016-05-02 DIAGNOSIS — R278 Other lack of coordination: Secondary | ICD-10-CM

## 2016-05-02 DIAGNOSIS — R262 Difficulty in walking, not elsewhere classified: Secondary | ICD-10-CM

## 2016-05-02 DIAGNOSIS — R41841 Cognitive communication deficit: Secondary | ICD-10-CM
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
## 2016-05-02 NOTE — Patient Instructions (Signed)
OT TREATMENT    Neuro muscular re-education:  Pt. Worked on grasping and placing 2" sticks, and placing them in a pegboard. Pt. Worked on removing them with resistive tweezers using his right hand.  Therapeutic Exercise:  Pt. performed gross gripping with grip strengthener. Pt. worked on sustaining grip while grasping pegs and reaching at various heights. Gripper was placed in the 3rd resistive slot with the white resistive spring.  Selfcare:  Manual Therapy:
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
## 2016-05-03 ENCOUNTER — Encounter: Payer: Self-pay | Admitting: Speech Pathology
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
## 2016-05-04 ENCOUNTER — Ambulatory Visit: Payer: 59 | Admitting: Occupational Therapy

## 2016-05-04 ENCOUNTER — Ambulatory Visit: Payer: 59

## 2016-05-04 ENCOUNTER — Ambulatory Visit: Payer: 59 | Attending: Family Medicine | Admitting: Speech Pathology

## 2016-05-04 DIAGNOSIS — R278 Other lack of coordination: Secondary | ICD-10-CM

## 2016-05-04 DIAGNOSIS — R41841 Cognitive communication deficit: Secondary | ICD-10-CM | POA: Insufficient documentation

## 2016-05-04 DIAGNOSIS — M6281 Muscle weakness (generalized): Secondary | ICD-10-CM | POA: Diagnosis present

## 2016-05-04 DIAGNOSIS — R262 Difficulty in walking, not elsewhere classified: Secondary | ICD-10-CM | POA: Diagnosis present
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
## 2016-05-04 NOTE — Patient Instructions (Signed)
OT TREATMENT     Neuro muscular re-education:  Pt. performed [facility] tasks using the Grooved pegboard. Pt. worked on grasping the grooved pegs from a horizontal position, and moving the pegs to a vertical position in the hand to prepare for placing them in the grooved slot.  Pt. worked with both the right, and left. Pt. worked on grasping, and storing the pegs in his hand. Pt. worked on alternating thumb opposition to the tip of the 2nd through 5th digits.  Therapeutic Exercise:  Pt. performed gross gripping with grip strengthener. Pt. worked on sustaining grip while grasping pegs and reaching at various heights. Gripper was placed in the 3rd, and 4th resistive slot with the white resistive spring. Pt. Worked on pinch strengthening in the left hand for lateral, and 3pt. pinch using yellow, red, green, and blue resistive clips. Pt. worked on placing the clips at various vertical and horizontal angles. Tactile and verbal cues were required for eliciting the desired movement.  Selfcare:  Manual Therapy:
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
## 2016-05-05 ENCOUNTER — Encounter: Payer: Self-pay | Admitting: Speech Pathology
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
## 2016-05-10 ENCOUNTER — Encounter: Payer: Self-pay | Admitting: Occupational Therapy

## 2016-05-10 ENCOUNTER — Ambulatory Visit: Payer: 59

## 2016-05-10 ENCOUNTER — Ambulatory Visit: Payer: 59 | Admitting: Occupational Therapy

## 2016-05-10 DIAGNOSIS — R278 Other lack of coordination: Secondary | ICD-10-CM

## 2016-05-10 DIAGNOSIS — R262 Difficulty in walking, not elsewhere classified: Secondary | ICD-10-CM

## 2016-05-10 DIAGNOSIS — M6281 Muscle weakness (generalized): Secondary | ICD-10-CM

## 2016-05-10 DIAGNOSIS — R41841 Cognitive communication deficit: Secondary | ICD-10-CM | POA: Diagnosis not present
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
## 2016-05-10 NOTE — Patient Instructions (Signed)
OT TREATMENT     Neuro muscular re-education:  Pt. performed [facility] skills training to improve speed and dexterity needed for ADL tasks and writing. Pt. demonstrated grasping 1 inch sticks,  inch cylindrical collars, and  inch flat washers on the Purdue pegboard. Pt. performed grasping each item with her 2nd digit and thumb, and storing them in the palm. Pt. presented with difficulty storing  inch objects at a time in the palmar aspect of the hand.  Therapeutic Exercise:  Pt. performed gross gripping with grip strengthener. Pt. worked on sustaining grip while grasping pegs and reaching at various heights. Gripper was placed in the 4th resistive slot with the white resistive spring. Pt. was able to clear the board 2 times. Pt. performed resistive EZ Board exercises for forearm supination/pronation, wrist flexion/extension using gross grasp, and lateral pinch (key) grasp. Pt. performed resistive EZ Board exercises angled in several planes to promote shoulder flexion, abduction, and wrist flexion, and extension while performing resistive wrist flexion and extension with a gross grip.  Selfcare:  Manual Therapy:
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
## 2016-05-16 ENCOUNTER — Ambulatory Visit: Payer: 59 | Admitting: Occupational Therapy

## 2016-05-16 ENCOUNTER — Ambulatory Visit: Payer: 59

## 2016-05-16 DIAGNOSIS — M6281 Muscle weakness (generalized): Secondary | ICD-10-CM

## 2016-05-16 DIAGNOSIS — R278 Other lack of coordination: Secondary | ICD-10-CM

## 2016-05-16 DIAGNOSIS — R41841 Cognitive communication deficit: Secondary | ICD-10-CM | POA: Diagnosis not present

## 2016-05-16 DIAGNOSIS — R262 Difficulty in walking, not elsewhere classified: Secondary | ICD-10-CM
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
## 2016-05-16 NOTE — Patient Instructions (Signed)
OT TREATMENT     Neuro muscular re-education:  Pt. Performed right hand [place] tasks using the Grooved pegboard. Pt. worked on grasping the grooved pegs from a horizontal position, and moving the pegs to a vertical position in the hand to prepare for placing them in the grooved slot. Pt. Worked on removing pegs alternating thumb opposition to the tip of his 2nd through 5th digits. Pt. Worked on grasping 1" resistive cubes with alternating thumb opposition to 2nd through 5th digits on a vertical angle. Pt. works on removing the cubes. Pt. Worked on grasping flipping, and stacking [place] style pegs with her right hand.   Therapeutic Exercise:  Pt. performed gross gripping with grip strengthener. Pt. worked on sustaining grip while grasping pegs and reaching at various heights. Gripper was placed in the 4th resistive slot with the white resistive spring with his right hand. Pt. Worked on pinch strengthening in the left hand for lateral, and 3pt. pinch using yellow, red, and green resistive clips. Pt. worked on placing the clips at various vertical and horizontal angles. Tactile and verbal cues were required for eliciting the desired movement.  Selfcare:  Manual Therapy:
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
## 2016-05-18 ENCOUNTER — Ambulatory Visit: Payer: 59

## 2016-05-18 ENCOUNTER — Ambulatory Visit: Payer: 59 | Admitting: Occupational Therapy

## 2016-05-18 DIAGNOSIS — M6281 Muscle weakness (generalized): Secondary | ICD-10-CM

## 2016-05-18 DIAGNOSIS — R262 Difficulty in walking, not elsewhere classified: Secondary | ICD-10-CM

## 2016-05-18 DIAGNOSIS — R278 Other lack of coordination: Secondary | ICD-10-CM

## 2016-05-18 DIAGNOSIS — R41841 Cognitive communication deficit: Secondary | ICD-10-CM | POA: Diagnosis not present
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
## 2016-05-18 NOTE — Patient Instructions (Addendum)
OT TREATMENT     Neuro muscular re-education:  Pt. worked on tasks to sustain lateral pinch on resistive tweezers while grasping and moving 2" toothpick sticks from a horizontal flat position to a vertical position in order to place it in the holder. Pt. was able to sustain grasp while positioning and extending the wrist/hand in the necessary alignment needed to place the stick through the top of the holder. Visual demonstration was required.  Therapeutic Exercise:  Pt. performed gross gripping with grip strengthener. Pt. worked on sustaining grip while grasping pegs and reaching at various heights. Gripper was placed in the 3rd and 4th resistive slots with the white resistive spring. Pt. worked on pinch strengthening in the left hand for lateral, and 3pt. pinch using yellow, red, green, and blue resistive clips. Pt. worked on placing the clips at various vertical and horizontal angles. Tactile and verbal cues were required for eliciting the desired movement. Pt. Performed hand strengthening with green theraputty. Pt. required cues for proper technique. Pt. worked on gross grip loop, lateral pinch, 3pt. pinch, gross digit extension, and thumb opposition. Pt. required verbal and tactile cues for proper technique. Pt. was provided with a visual handout.  Selfcare:  Pt. Worked on opening various sizes of medication bottles, with varying degrees of difficulty. Pt. Required cues for UE positioning to avoid raising right shoulder when opening.  Manual Therapy:
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
## 2016-05-22 ENCOUNTER — Ambulatory Visit: Payer: 59 | Admitting: Speech Pathology

## 2016-05-22 ENCOUNTER — Ambulatory Visit: Payer: 59 | Admitting: Occupational Therapy

## 2016-05-22 ENCOUNTER — Encounter: Payer: Self-pay | Admitting: Speech Pathology

## 2016-05-22 ENCOUNTER — Ambulatory Visit: Payer: 59

## 2016-05-22 DIAGNOSIS — R278 Other lack of coordination: Secondary | ICD-10-CM

## 2016-05-22 DIAGNOSIS — R41841 Cognitive communication deficit: Secondary | ICD-10-CM | POA: Diagnosis not present

## 2016-05-22 DIAGNOSIS — M6281 Muscle weakness (generalized): Secondary | ICD-10-CM

## 2016-05-22 DIAGNOSIS — R262 Difficulty in walking, not elsewhere classified: Secondary | ICD-10-CM
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
## 2016-05-22 NOTE — Patient Instructions (Addendum)
OT TREATMENT     Neuro muscular re-education:  Pt. worked on bilateral [facility] skills with sustained shoulder flexion using a PCP pipe tower on a vertical angle. Pt. was able follow design patterns in multiple levels of difficulty. Pt. required cues for the most difficult pattern. Pt. worked on grasping 1/4" pegs using long nosed tweezers. Pt. presented with fine motor coordination while using the tweezers when placing the pegs when positioned in the same direction. Pt. had more difficulty changing the position of the peg from horizontal to vertical position. Pt.required stabilization of the pegboard while manipulating the pegs on the pegboard. Pt. Removed the pegs using his thumb and 2nd digit.  Therapeutic Exercise:  Pt. performed gross gripping with grip strengthener. Pt. worked on sustaining grip while grasping pegs and reaching at various heights. Gripper was placed in the resistive slot with the white resistive spring.    Selfcare:  Manual Therapy:
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
## 2016-05-24 ENCOUNTER — Ambulatory Visit: Payer: 59 | Admitting: Occupational Therapy

## 2016-05-24 ENCOUNTER — Encounter: Payer: Self-pay | Admitting: Speech Pathology

## 2016-05-24 ENCOUNTER — Ambulatory Visit: Payer: 59 | Admitting: Speech Pathology

## 2016-05-24 ENCOUNTER — Encounter: Payer: 59 | Attending: Physical Medicine & Rehabilitation | Admitting: Physical Medicine & Rehabilitation

## 2016-05-24 ENCOUNTER — Ambulatory Visit: Payer: 59

## 2016-05-24 ENCOUNTER — Encounter: Payer: Self-pay | Admitting: Physical Medicine & Rehabilitation

## 2016-05-24 ENCOUNTER — Encounter: Payer: Self-pay | Admitting: Occupational Therapy

## 2016-05-24 VITALS — BP 126/82 | HR 71 | Resp 14

## 2016-05-24 DIAGNOSIS — Z818 Family history of other mental and behavioral disorders: Secondary | ICD-10-CM | POA: Diagnosis not present

## 2016-05-24 DIAGNOSIS — F5101 Primary insomnia: Secondary | ICD-10-CM | POA: Diagnosis not present

## 2016-05-24 DIAGNOSIS — S069X3S Unspecified intracranial injury with loss of consciousness of 1 hour to 5 hours 59 minutes, sequela: Secondary | ICD-10-CM

## 2016-05-24 DIAGNOSIS — R41841 Cognitive communication deficit: Secondary | ICD-10-CM

## 2016-05-24 DIAGNOSIS — Z87891 Personal history of nicotine dependence: Secondary | ICD-10-CM | POA: Insufficient documentation

## 2016-05-24 DIAGNOSIS — M6281 Muscle weakness (generalized): Secondary | ICD-10-CM

## 2016-05-24 DIAGNOSIS — I10 Essential (primary) hypertension: Secondary | ICD-10-CM | POA: Diagnosis not present

## 2016-05-24 DIAGNOSIS — G9389 Other specified disorders of brain: Secondary | ICD-10-CM | POA: Diagnosis not present

## 2016-05-24 DIAGNOSIS — R1310 Dysphagia, unspecified: Secondary | ICD-10-CM | POA: Insufficient documentation

## 2016-05-24 DIAGNOSIS — Z8349 Family history of other endocrine, nutritional and metabolic diseases: Secondary | ICD-10-CM | POA: Diagnosis not present

## 2016-05-24 DIAGNOSIS — N319 Neuromuscular dysfunction of bladder, unspecified: Secondary | ICD-10-CM | POA: Insufficient documentation

## 2016-05-24 DIAGNOSIS — R278 Other lack of coordination: Secondary | ICD-10-CM

## 2016-05-24 DIAGNOSIS — Z5181 Encounter for therapeutic drug level monitoring: Secondary | ICD-10-CM | POA: Diagnosis not present

## 2016-05-24 DIAGNOSIS — S0292XD Unspecified fracture of facial bones, subsequent encounter for fracture with routine healing: Secondary | ICD-10-CM | POA: Insufficient documentation

## 2016-05-24 DIAGNOSIS — S52514D Nondisplaced fracture of right radial styloid process, subsequent encounter for closed fracture with routine healing: Secondary | ICD-10-CM | POA: Diagnosis not present

## 2016-05-24 DIAGNOSIS — G939 Disorder of brain, unspecified: Secondary | ICD-10-CM | POA: Insufficient documentation

## 2016-05-24 DIAGNOSIS — R262 Difficulty in walking, not elsewhere classified: Secondary | ICD-10-CM

## 2016-05-24 DIAGNOSIS — S06890D Other specified intracranial injury without loss of consciousness, subsequent encounter: Secondary | ICD-10-CM | POA: Insufficient documentation

## 2016-05-24 DIAGNOSIS — R4189 Other symptoms and signs involving cognitive functions and awareness: Secondary | ICD-10-CM | POA: Insufficient documentation

## 2016-05-24 DIAGNOSIS — Z79899 Other long term (current) drug therapy: Secondary | ICD-10-CM

## 2016-05-24 DIAGNOSIS — F0789 Other personality and behavioral disorders due to known physiological condition: Secondary | ICD-10-CM | POA: Insufficient documentation

## 2016-05-24 MED ORDER — METHYLPHENIDATE HCL ER (OSM) 27 MG PO TBCR: 27.0000 mg | EXTENDED_RELEASE_TABLET | ORAL | 0 refills | Status: DC

## 2016-05-24 MED ORDER — TRAZODONE HCL 100 MG PO TABS: 100.0000 mg | ORAL_TABLET | Freq: Every day | ORAL | 3 refills | Status: DC
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
## 2016-05-24 NOTE — Patient Instructions (Addendum)
PLEASE FEEL FREE TO CALL OUR OFFICE WITH ANY PROBLEMS OR QUESTIONS [phone])    YOU NEED TO WORK ON YOUR RANGE OF MOTION.
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
## 2016-05-24 NOTE — Patient Instructions (Signed)
OT TREATMENT     Neuro muscular re-education:  Pt. performed [facility] skills training to improve speed and dexterity needed for ADL tasks and writing. Pt. demonstrated grasping 1 inch sticks,  inch cylindrical collars, and  inch flat washers on the Purdue pegboard. Pt. performed grasping each item with her 2nd digit and thumb. Pt. presented with difficulty storing  inch objects at a time in the palmar aspect of the hand. Pt. worked on alternating hand movements to remove the pegs.  Therapeutic Exercise:  Pt. performed gross gripping with grip strengthener. Pt. worked on sustaining grip while grasping pegs and reaching at various heights. Gripper was placed in the 3rd, and 4th resistive slot with the white resistive spring. Pt. Worked on pinch strengthening in the left hand for lateral, and 3pt. pinch using yellow, red, green, and blue resistive clips. Pt. worked on placing the clips at horizontal angles. Tactile and verbal cues were required for eliciting the desired movement. Pt. Worked with the digiflex at 1.5 lbs.  Selfcare:  Pt. Worked on Armed forces training and education officer. Pt. Is improving with legibility, and pen control. Pt. continues to work on improving writing speed.   Manual Therapy:

## 2016-05-29 ENCOUNTER — Encounter: Payer: Self-pay | Admitting: Occupational Therapy

## 2016-05-29 ENCOUNTER — Ambulatory Visit: Payer: 59

## 2016-05-29 ENCOUNTER — Encounter: Payer: Self-pay | Admitting: Speech Pathology

## 2016-05-29 ENCOUNTER — Ambulatory Visit: Payer: 59 | Admitting: Occupational Therapy

## 2016-05-29 ENCOUNTER — Ambulatory Visit: Payer: 59 | Admitting: Speech Pathology

## 2016-05-29 DIAGNOSIS — R262 Difficulty in walking, not elsewhere classified: Secondary | ICD-10-CM

## 2016-05-29 DIAGNOSIS — R278 Other lack of coordination: Secondary | ICD-10-CM

## 2016-05-29 DIAGNOSIS — R41841 Cognitive communication deficit: Secondary | ICD-10-CM

## 2016-05-29 DIAGNOSIS — M6281 Muscle weakness (generalized): Secondary | ICD-10-CM
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
## 2016-05-29 NOTE — Patient Instructions (Signed)
OT TREATMENT     Neuro muscular re-education:  Pt. performed [facility] skills training to improve speed and dexterity needed for ADL tasks and writing. Pt. demonstrated grasping 1 inch sticks,  inch cylindrical collars, and  inch flat washers on the Purdue pegboard. Pt. performed grasping each item with her 2nd digit and thumb, and storing them in the palm. Pt. presented with difficulty storing  inch objects at a time in the palmar aspect of the hand.  Therapeutic Exercise:  Pt. performed gross gripping with grip strengthener right hand. Pt. worked on sustaining grip while grasping pegs and reaching at various heights. Gripper was placed in the 3rd and 4th resistive slot with the white resistive spring. Pt. Worked on pinch strengthening in the left hand for lateral, and 3pt. pinch using yellow, red, green, and blue resistive clips. Pt. worked on placing the clips at various vertical and horizontal angles. Tactile and verbal cues were required for eliciting the desired movement.

## 2016-05-30 LAB — TOXASSURE SELECT,+ANTIDEPR,UR

## 2016-05-31 ENCOUNTER — Ambulatory Visit: Payer: 59

## 2016-05-31 ENCOUNTER — Encounter: Payer: Self-pay | Admitting: Speech Pathology

## 2016-05-31 ENCOUNTER — Encounter: Payer: Self-pay | Admitting: Occupational Therapy

## 2016-05-31 ENCOUNTER — Ambulatory Visit: Payer: 59 | Admitting: Speech Pathology

## 2016-05-31 ENCOUNTER — Ambulatory Visit: Payer: 59 | Admitting: Occupational Therapy

## 2016-05-31 DIAGNOSIS — R278 Other lack of coordination: Secondary | ICD-10-CM

## 2016-05-31 DIAGNOSIS — M6281 Muscle weakness (generalized): Secondary | ICD-10-CM

## 2016-05-31 DIAGNOSIS — R41841 Cognitive communication deficit: Secondary | ICD-10-CM | POA: Diagnosis not present

## 2016-05-31 DIAGNOSIS — R262 Difficulty in walking, not elsewhere classified: Secondary | ICD-10-CM
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
## 2016-05-31 NOTE — Patient Instructions (Signed)
OT TREATMENT     Neuro muscular re-education:  Pt. worked on grasping small pegs with his right hand, and placing them in the pegboard while copying a design. Pt. worked on translatory movements of the hand moving objects within the hand. Pt. worked on the [employer] with cues, and visual demonstration for movement technique.  Therapeutic Exercise:  Pt. performed gross gripping with grip strengthener. Pt. worked on sustaining grip while grasping pegs and reaching at various heights. Gripper was placed in the 3rd, and 4th resistive slot with the white resistive spring.  Pt. worked on the digiflex 1.5#  with the right hand. Pt. had difficulty  Isolating the right hand  Selfcare:  Manual Therapy:

## 2016-05-31 NOTE — Progress Notes (Signed)
Urine drug screen for this encounter is consistent for prescribed medication 
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
## 2016-06-06 ENCOUNTER — Ambulatory Visit: Payer: 59 | Attending: Family Medicine | Admitting: Speech Pathology

## 2016-06-06 ENCOUNTER — Ambulatory Visit: Payer: 59

## 2016-06-06 ENCOUNTER — Ambulatory Visit: Payer: 59 | Admitting: Occupational Therapy

## 2016-06-06 DIAGNOSIS — M6281 Muscle weakness (generalized): Secondary | ICD-10-CM | POA: Insufficient documentation

## 2016-06-06 DIAGNOSIS — R262 Difficulty in walking, not elsewhere classified: Secondary | ICD-10-CM | POA: Insufficient documentation

## 2016-06-06 DIAGNOSIS — R41841 Cognitive communication deficit: Secondary | ICD-10-CM | POA: Insufficient documentation

## 2016-06-06 DIAGNOSIS — R278 Other lack of coordination: Secondary | ICD-10-CM | POA: Diagnosis present
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
## 2016-06-07 ENCOUNTER — Encounter: Payer: Self-pay | Admitting: Speech Pathology
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
## 2016-06-13 ENCOUNTER — Encounter: Payer: Self-pay | Admitting: Speech Pathology

## 2016-06-13 ENCOUNTER — Ambulatory Visit: Payer: 59 | Admitting: Occupational Therapy

## 2016-06-13 ENCOUNTER — Encounter: Payer: Self-pay | Admitting: Occupational Therapy

## 2016-06-13 ENCOUNTER — Ambulatory Visit: Payer: 59 | Admitting: Speech Pathology

## 2016-06-13 ENCOUNTER — Ambulatory Visit: Payer: 59

## 2016-06-13 DIAGNOSIS — R41841 Cognitive communication deficit: Secondary | ICD-10-CM | POA: Diagnosis not present

## 2016-06-13 DIAGNOSIS — R278 Other lack of coordination: Secondary | ICD-10-CM

## 2016-06-13 DIAGNOSIS — M6281 Muscle weakness (generalized): Secondary | ICD-10-CM

## 2016-06-13 NOTE — Patient Instructions (Signed)
OT TREATMENT     Neuro muscular re-education:  Pt. Worked on grasping, turning, and flipping pegs on the instructo board with his right hand. Pt. Requires visual demonstration, tactile, and verbal cues. Pt. Worked on grasping 1" resistive cubes using thumb opposition to the tip of the 2nd and 3rd digits. Pt. Pressed them back into place alternation thumb opposition to all digits. Pt. performed [facility] skills training to improve speed and dexterity needed for ADL tasks and writing. Pt. demonstrated grasping 1 inch sticks,  inch cylindrical collars, and  inch flat washers on the Purdue pegboard. Pt. performed grasping each item with her 2nd digit and thumb, and storing them in the palm. Pt. presented with difficulty storing  inch objects at a time in the palmar aspect of the hand.  Therapeutic Exercise:  Pt. performed gross gripping with grip strengthener. Pt. worked on sustaining grip while grasping pegs and reaching at various heights. Gripper was placed in the 3rd resistive slot with the white resistive spring.  Selfcare:  Manual Therapy:
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
## 2016-06-19 ENCOUNTER — Ambulatory Visit: Payer: 59

## 2016-06-19 ENCOUNTER — Ambulatory Visit: Payer: 59 | Admitting: Occupational Therapy

## 2016-06-19 DIAGNOSIS — R278 Other lack of coordination: Secondary | ICD-10-CM

## 2016-06-19 DIAGNOSIS — M6281 Muscle weakness (generalized): Secondary | ICD-10-CM

## 2016-06-19 DIAGNOSIS — R41841 Cognitive communication deficit: Secondary | ICD-10-CM | POA: Diagnosis not present

## 2016-06-19 DIAGNOSIS — R262 Difficulty in walking, not elsewhere classified: Secondary | ICD-10-CM

## 2016-06-19 NOTE — Patient Instructions (Addendum)
OT TREATMENT     Neuro muscular re-education:  Pt. worked on grasping, flipping, and stacking pegs with the right hand.   Therapeutic Exercise:  Pt. performed gross gripping with grip strengthener. Pt. worked on sustaining grip while grasping pegs and reaching at various heights. Gripper was placed in the 3rd resistive slot with the white resistive spring.  Selfcare:  Pt. Worked on check writing.using simulated checks with his right hand. Pt. was able was able to complete.  Manual Therapy:
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
## 2016-06-21 ENCOUNTER — Ambulatory Visit: Payer: 59 | Admitting: Speech Pathology

## 2016-06-21 ENCOUNTER — Encounter: Payer: Self-pay | Admitting: Speech Pathology

## 2016-06-21 DIAGNOSIS — R41841 Cognitive communication deficit: Secondary | ICD-10-CM | POA: Diagnosis not present
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
## 2016-06-26 ENCOUNTER — Ambulatory Visit: Payer: 59 | Admitting: Occupational Therapy

## 2016-06-26 ENCOUNTER — Ambulatory Visit: Payer: 59 | Attending: Family Medicine

## 2016-06-26 DIAGNOSIS — R278 Other lack of coordination: Secondary | ICD-10-CM | POA: Diagnosis present

## 2016-06-26 DIAGNOSIS — M6281 Muscle weakness (generalized): Secondary | ICD-10-CM | POA: Diagnosis not present

## 2016-06-26 DIAGNOSIS — R262 Difficulty in walking, not elsewhere classified: Secondary | ICD-10-CM | POA: Diagnosis present

## 2016-06-26 DIAGNOSIS — R41841 Cognitive communication deficit: Secondary | ICD-10-CM | POA: Diagnosis not present
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
## 2016-06-26 NOTE — Patient Instructions (Addendum)
OT TREATMENT     Neuro muscular re-education:  Pt. performed [facility] skills training to improve speed and dexterity needed for ADL tasks and writing. Pt. demonstrated grasping 1 inch sticks,  inch cylindrical collars, and  inch flat washers on the Purdue pegboard. Pt. performed grasping each item with her 2nd digit and thumb, and storing them in the palm. Task was modified to have pt. Hold a 1" foam ball in the ulnar aspect of his hand while grasping with his 2nd digit, and thumb. Pt. presented with difficulty storing  inch objects at a time in the palmar aspect of the hand. Alternating bilateral hand movements. Pt. Required cues to keep right elbow down at his side.  Therapeutic Exercise:  Pt. performed gross gripping with grip strengthener. Pt. worked on sustaining grip while grasping pegs and reaching at various heights. Gripper was placed in the 3rd resistive slot with the white resistive spring. Pt. Performed 2 reps. Pt. Worked on pinch strengthening in the left hand for lateral, and 3pt. pinch using yellow, red, and green resistive clips. Pt. worked on placing the clips at various vertical and horizontal angles. Tactile and verbal cues were required for eliciting the desired movement.  Selfcare:  Manual Therapy:

## 2016-06-28 ENCOUNTER — Ambulatory Visit: Payer: 59 | Admitting: Speech Pathology

## 2016-06-28 ENCOUNTER — Encounter: Payer: Self-pay | Admitting: Speech Pathology

## 2016-06-28 DIAGNOSIS — R41841 Cognitive communication deficit: Secondary | ICD-10-CM | POA: Diagnosis not present
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
## 2016-07-05 ENCOUNTER — Ambulatory Visit: Payer: 59 | Admitting: Speech Pathology

## 2016-07-05 ENCOUNTER — Encounter: Payer: Self-pay | Admitting: Speech Pathology

## 2016-07-05 ENCOUNTER — Ambulatory Visit: Payer: 59 | Admitting: Occupational Therapy

## 2016-07-05 ENCOUNTER — Ambulatory Visit: Payer: 59 | Attending: Family Medicine

## 2016-07-05 DIAGNOSIS — R262 Difficulty in walking, not elsewhere classified: Secondary | ICD-10-CM | POA: Diagnosis present

## 2016-07-05 DIAGNOSIS — R41841 Cognitive communication deficit: Secondary | ICD-10-CM | POA: Insufficient documentation

## 2016-07-05 DIAGNOSIS — M6281 Muscle weakness (generalized): Secondary | ICD-10-CM | POA: Diagnosis present

## 2016-07-05 DIAGNOSIS — R278 Other lack of coordination: Secondary | ICD-10-CM | POA: Insufficient documentation
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
## 2016-07-09 ENCOUNTER — Encounter: Payer: Self-pay | Admitting: Occupational Therapy
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
## 2016-07-10 ENCOUNTER — Ambulatory Visit (INDEPENDENT_AMBULATORY_CARE_PROVIDER_SITE_OTHER): Payer: 59 | Admitting: Family Medicine

## 2016-07-10 ENCOUNTER — Encounter: Payer: Self-pay | Admitting: Family Medicine

## 2016-07-10 VITALS — BP 114/72 | HR 58 | Temp 97.7°F | Resp 16 | Wt 195.0 lb

## 2016-07-10 DIAGNOSIS — I10 Essential (primary) hypertension: Secondary | ICD-10-CM

## 2016-07-10 DIAGNOSIS — S069X3S Unspecified intracranial injury with loss of consciousness of 1 hour to 5 hours 59 minutes, sequela: Secondary | ICD-10-CM | POA: Diagnosis not present

## 2016-07-10 DIAGNOSIS — Z23 Encounter for immunization: Secondary | ICD-10-CM | POA: Diagnosis not present

## 2016-07-10 DIAGNOSIS — M25511 Pain in right shoulder: Secondary | ICD-10-CM | POA: Diagnosis not present
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
## 2016-07-10 NOTE — Patient Instructions (Signed)
Call eye doctor and schedule appt with Opthalmology.

## 2016-07-12 ENCOUNTER — Ambulatory Visit: Payer: 59 | Admitting: Occupational Therapy

## 2016-07-12 ENCOUNTER — Ambulatory Visit: Payer: 59 | Admitting: Speech Pathology

## 2016-07-12 ENCOUNTER — Ambulatory Visit: Payer: 59

## 2016-07-12 ENCOUNTER — Encounter: Payer: Self-pay | Admitting: Speech Pathology

## 2016-07-12 DIAGNOSIS — M6281 Muscle weakness (generalized): Secondary | ICD-10-CM

## 2016-07-12 DIAGNOSIS — R41841 Cognitive communication deficit: Secondary | ICD-10-CM

## 2016-07-12 DIAGNOSIS — R278 Other lack of coordination: Secondary | ICD-10-CM

## 2016-07-12 DIAGNOSIS — R262 Difficulty in walking, not elsewhere classified: Secondary | ICD-10-CM
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
## 2016-07-12 NOTE — Patient Instructions (Signed)
OT TREATMENT    Neuro muscular re-education:  Pt. performed [facility] skills training to improve speed and dexterity needed for ADL tasks and writing. Pt. demonstrated grasping 1 inch sticks,  inch cylindrical collars, and  inch flat washers on the Purdue pegboard. Pt. performed grasping each item with her 2nd digit and thumb, and storing them in the palm. Pt. presented with difficulty storing  inch objects at a time in the palmar aspect of the hand. Pt. worked on tasks to sustain lateral pinch on resistive tweezers while grasping and moving 2" toothpick sticks from a horizontal flat position to a vertical position in order to place it in the holder. Pt. was able to sustain grasp while positioning and extending the wrist/hand in the necessary alignment needed to place the stick through the top of the holder.  Therapeutic Exercise:  Pt. performed gross gripping with grip strengthener. Pt. worked on sustaining grip while grasping pegs and reaching at various heights. Gripper was placed in the 3rd resistive slot with the white resistive spring. Pt. Worked with supination, pronation with a hammer.  Selfcare:  Manual Therapy:
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
## 2016-07-12 NOTE — Addendum Note (Signed)
Addended by: Blain Pais on: 07/12/2016 04:37 PM   Modules accepted: Orders

## 2016-07-19 ENCOUNTER — Ambulatory Visit: Payer: 59 | Admitting: Speech Pathology

## 2016-07-19 ENCOUNTER — Ambulatory Visit: Payer: 59

## 2016-07-19 ENCOUNTER — Encounter
Payer: PRIVATE HEALTH INSURANCE | Attending: Physical Medicine & Rehabilitation | Admitting: Physical Medicine & Rehabilitation

## 2016-07-19 ENCOUNTER — Encounter: Payer: Self-pay | Admitting: Physical Medicine & Rehabilitation

## 2016-07-19 ENCOUNTER — Ambulatory Visit: Payer: 59 | Admitting: Occupational Therapy

## 2016-07-19 DIAGNOSIS — S0292XD Unspecified fracture of facial bones, subsequent encounter for fracture with routine healing: Secondary | ICD-10-CM | POA: Diagnosis present

## 2016-07-19 DIAGNOSIS — S069X3S Unspecified intracranial injury with loss of consciousness of 1 hour to 5 hours 59 minutes, sequela: Secondary | ICD-10-CM

## 2016-07-19 DIAGNOSIS — S069X0S Unspecified intracranial injury without loss of consciousness, sequela: Secondary | ICD-10-CM | POA: Diagnosis not present

## 2016-07-19 DIAGNOSIS — N319 Neuromuscular dysfunction of bladder, unspecified: Secondary | ICD-10-CM | POA: Insufficient documentation

## 2016-07-19 DIAGNOSIS — R278 Other lack of coordination: Secondary | ICD-10-CM

## 2016-07-19 DIAGNOSIS — S52514D Nondisplaced fracture of right radial styloid process, subsequent encounter for closed fracture with routine healing: Secondary | ICD-10-CM | POA: Insufficient documentation

## 2016-07-19 DIAGNOSIS — F5101 Primary insomnia: Secondary | ICD-10-CM

## 2016-07-19 DIAGNOSIS — F068 Other specified mental disorders due to known physiological condition: Secondary | ICD-10-CM | POA: Insufficient documentation

## 2016-07-19 DIAGNOSIS — F0789 Other personality and behavioral disorders due to known physiological condition: Secondary | ICD-10-CM

## 2016-07-19 DIAGNOSIS — Z8349 Family history of other endocrine, nutritional and metabolic diseases: Secondary | ICD-10-CM | POA: Diagnosis not present

## 2016-07-19 DIAGNOSIS — Z818 Family history of other mental and behavioral disorders: Secondary | ICD-10-CM | POA: Diagnosis not present

## 2016-07-19 DIAGNOSIS — S06890D Other specified intracranial injury without loss of consciousness, subsequent encounter: Secondary | ICD-10-CM | POA: Diagnosis not present

## 2016-07-19 DIAGNOSIS — G9389 Other specified disorders of brain: Secondary | ICD-10-CM | POA: Diagnosis not present

## 2016-07-19 DIAGNOSIS — Z87891 Personal history of nicotine dependence: Secondary | ICD-10-CM | POA: Diagnosis not present

## 2016-07-19 DIAGNOSIS — R1310 Dysphagia, unspecified: Secondary | ICD-10-CM | POA: Insufficient documentation

## 2016-07-19 DIAGNOSIS — M6281 Muscle weakness (generalized): Secondary | ICD-10-CM | POA: Diagnosis not present

## 2016-07-19 DIAGNOSIS — R4189 Other symptoms and signs involving cognitive functions and awareness: Secondary | ICD-10-CM | POA: Diagnosis not present

## 2016-07-19 DIAGNOSIS — M7581 Other shoulder lesions, right shoulder: Secondary | ICD-10-CM | POA: Diagnosis not present

## 2016-07-19 DIAGNOSIS — F079 Unspecified personality and behavioral disorder due to known physiological condition: Secondary | ICD-10-CM

## 2016-07-19 DIAGNOSIS — R41841 Cognitive communication deficit: Secondary | ICD-10-CM

## 2016-07-19 DIAGNOSIS — I10 Essential (primary) hypertension: Secondary | ICD-10-CM | POA: Diagnosis not present

## 2016-07-19 DIAGNOSIS — R262 Difficulty in walking, not elsewhere classified: Secondary | ICD-10-CM

## 2016-07-19 MED ORDER — METHYLPHENIDATE HCL ER (OSM) 36 MG PO TBCR: 36.0000 mg | EXTENDED_RELEASE_TABLET | Freq: Every day | ORAL | 0 refills | Status: DC

## 2016-07-19 MED ORDER — TRAZODONE HCL 100 MG PO TABS: 159.0000 mg | ORAL_TABLET | Freq: Every day | ORAL | 3 refills | Status: DC

## 2016-07-19 MED ORDER — METHYLPHENIDATE HCL ER (OSM) 36 MG PO TBCR
36.0000 mg | EXTENDED_RELEASE_TABLET | Freq: Every day | ORAL | 0 refills | Status: DC
Start: 2016-07-19 — End: 2016-09-20
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
## 2016-07-19 NOTE — Patient Instructions (Signed)
1. CONTINUE TO WORK ON SCHEDULE/ROUTINE 2. MAINTAIN A WIND-DOWN/RELAXATION PERIOD BEFORE SLEEP 3. WORK ON SHOULDER RANGE OF MOTION WITH THERAPY AND AT HOME?   PLEASE FEEL FREE TO CALL OUR OFFICE WITH ANY PROBLEMS OR QUESTIONS ([phone])

## 2016-07-19 NOTE — Patient Instructions (Signed)
OT TREATMENT     Neuro muscular re-education:  Pt. performed [facility] skills training to improve speed and dexterity needed for ADL tasks and writing. Pt. demonstrated grasping 1 inch sticks,  inch cylindrical collars, and  inch flat washers on the Purdue pegboard. Pt. performed grasping each item with her 2nd digit and thumb, and storing them in the palm. Pt. presented with difficulty storing  inch objects at a time in the palmar aspect of the hand. Pt. Worked on alternating bilateral hand patterns. Pt. Worked on grasping 1/4" pegs using long-nosed tweezers with his right hand.  Therapeutic Exercise:  Pt. performed gross gripping with grip strengthener. Pt. worked on sustaining grip while grasping pegs and reaching at various heights. Gripper was placed in the 3rd resistive slot with the white resistive spring.  Manual Therapy:

## 2016-07-20 ENCOUNTER — Encounter: Payer: Self-pay | Admitting: Speech Pathology
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
## 2016-07-26 ENCOUNTER — Ambulatory Visit: Payer: 59

## 2016-07-26 ENCOUNTER — Ambulatory Visit: Payer: 59 | Admitting: Occupational Therapy

## 2016-07-26 ENCOUNTER — Ambulatory Visit: Payer: 59 | Admitting: Speech Pathology

## 2016-07-26 VITALS — BP 135/88 | HR 65

## 2016-07-26 DIAGNOSIS — R262 Difficulty in walking, not elsewhere classified: Secondary | ICD-10-CM

## 2016-07-26 DIAGNOSIS — M6281 Muscle weakness (generalized): Secondary | ICD-10-CM | POA: Diagnosis not present

## 2016-07-26 DIAGNOSIS — R41841 Cognitive communication deficit: Secondary | ICD-10-CM
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
## 2016-07-26 NOTE — Patient Instructions (Addendum)
OT TREATMENT     Neuro muscular re-education:  Therapeutic Exercise:  Selfcare:  The [place] Occupational Performance Measure was administered to pt. Pt. required increased time, and verbal cues for assist. Performance score is 2.4, Satisfaction Score is 1.6 for occupational performance problems areas.

## 2016-07-27 ENCOUNTER — Encounter: Payer: Self-pay | Admitting: Speech Pathology
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
## 2016-08-02 ENCOUNTER — Ambulatory Visit: Payer: 59 | Admitting: Speech Pathology

## 2016-08-02 ENCOUNTER — Ambulatory Visit: Payer: 59

## 2016-08-02 ENCOUNTER — Ambulatory Visit: Payer: 59 | Attending: Family Medicine | Admitting: Occupational Therapy

## 2016-08-02 VITALS — BP 156/88 | HR 55

## 2016-08-02 DIAGNOSIS — R262 Difficulty in walking, not elsewhere classified: Secondary | ICD-10-CM | POA: Diagnosis present

## 2016-08-02 DIAGNOSIS — M6281 Muscle weakness (generalized): Secondary | ICD-10-CM | POA: Diagnosis not present

## 2016-08-02 DIAGNOSIS — R278 Other lack of coordination: Secondary | ICD-10-CM | POA: Diagnosis present

## 2016-08-02 DIAGNOSIS — R41841 Cognitive communication deficit: Secondary | ICD-10-CM | POA: Insufficient documentation
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
## 2016-08-02 NOTE — Patient Instructions (Signed)
OT TREATMENT     Neuro muscular re-education:  Pt. performed [facility] skills training to improve speed and dexterity needed for ADL tasks and writing. Pt. demonstrated grasping 1 inch sticks,  inch cylindrical collars, and  inch flat washers on the Purdue pegboard. Pt. performed grasping each item with her 2nd digit and thumb, and storing them in the palm. Pt. presented with difficulty storing  inch objects at a time in the palmar aspect of the hand.  Therapeutic Exercise: Pt. worked on intrinsic stretching with hand. Pt. performed resistive EZ Board exercises for forearm supination/pronation, wrist flexion/extension using gross grasp, and lateral pinch (key) grasp. Pt. performed resistive EZ Board exercises angled in several planes to promote shoulder flexion, abduction, and wrist flexion, and extension while performing resistive wrist flexion and extension with a gross grip.  Selfcare:  Manual Therapy:

## 2016-08-03 ENCOUNTER — Encounter: Payer: Self-pay | Admitting: Speech Pathology
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
## 2016-08-07 ENCOUNTER — Ambulatory Visit: Payer: 59 | Admitting: Occupational Therapy

## 2016-08-07 ENCOUNTER — Encounter: Payer: Self-pay | Admitting: Speech Pathology

## 2016-08-07 ENCOUNTER — Ambulatory Visit: Payer: 59 | Admitting: Speech Pathology

## 2016-08-07 DIAGNOSIS — R41841 Cognitive communication deficit: Secondary | ICD-10-CM

## 2016-08-07 DIAGNOSIS — R278 Other lack of coordination: Secondary | ICD-10-CM

## 2016-08-07 DIAGNOSIS — M6281 Muscle weakness (generalized): Secondary | ICD-10-CM

## 2016-08-07 NOTE — Patient Instructions (Signed)
OT TREATMENT     Neuro muscular re-education:  Pt. worked on grasping 1" resistive cubes using is thumb, and second digit. Pt. Worked on pressing them into place while alternating isolated 2nd through 5th digits.   Therapeutic Exercise:  Pt. performed gross gripping with grip strengthener. Pt. worked on sustaining grip while grasping pegs and reaching at various heights. Gripper was placed in the 3rd resistive slot with the white resistive spring.Pt. Worked on pinch strengthening in the left hand for lateral, and 3pt. pinch using yellow, red, green, and blue resistive clips. Pt. worked on placing the clips at various horizontal angles. Tactile and verbal cues were required for eliciting the desired movement.  Selfcare:  Manual Therapy:
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
## 2016-08-16 ENCOUNTER — Ambulatory Visit: Payer: 59 | Admitting: Speech Pathology

## 2016-08-16 ENCOUNTER — Ambulatory Visit: Payer: 59 | Admitting: Physical Therapy

## 2016-08-16 ENCOUNTER — Ambulatory Visit: Payer: 59 | Admitting: Occupational Therapy

## 2016-08-16 VITALS — BP 117/82

## 2016-08-16 DIAGNOSIS — R262 Difficulty in walking, not elsewhere classified: Secondary | ICD-10-CM

## 2016-08-16 DIAGNOSIS — R41841 Cognitive communication deficit: Secondary | ICD-10-CM

## 2016-08-16 DIAGNOSIS — M6281 Muscle weakness (generalized): Secondary | ICD-10-CM

## 2016-08-16 DIAGNOSIS — R278 Other lack of coordination: Secondary | ICD-10-CM
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
## 2016-08-17 ENCOUNTER — Encounter: Payer: Self-pay | Admitting: Speech Pathology
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
## 2016-08-21 ENCOUNTER — Ambulatory Visit: Payer: 59 | Admitting: Speech Pathology

## 2016-08-21 ENCOUNTER — Encounter: Payer: Self-pay | Admitting: Speech Pathology

## 2016-08-21 DIAGNOSIS — R41841 Cognitive communication deficit: Secondary | ICD-10-CM

## 2016-08-21 DIAGNOSIS — M6281 Muscle weakness (generalized): Secondary | ICD-10-CM | POA: Diagnosis not present
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
## 2016-08-23 ENCOUNTER — Ambulatory Visit: Payer: 59 | Admitting: Occupational Therapy

## 2016-08-23 ENCOUNTER — Encounter: Payer: Self-pay | Admitting: Physical Therapy

## 2016-08-23 ENCOUNTER — Ambulatory Visit: Payer: 59 | Admitting: Physical Therapy

## 2016-08-23 VITALS — BP 132/96 | HR 65

## 2016-08-23 DIAGNOSIS — M6281 Muscle weakness (generalized): Secondary | ICD-10-CM

## 2016-08-23 DIAGNOSIS — R262 Difficulty in walking, not elsewhere classified: Secondary | ICD-10-CM

## 2016-08-23 DIAGNOSIS — R278 Other lack of coordination: Secondary | ICD-10-CM
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
## 2016-08-29 ENCOUNTER — Ambulatory Visit: Payer: 59 | Admitting: Occupational Therapy

## 2016-08-29 ENCOUNTER — Ambulatory Visit: Payer: 59 | Admitting: Speech Pathology

## 2016-08-29 ENCOUNTER — Encounter: Payer: PRIVATE HEALTH INSURANCE | Admitting: Occupational Therapy

## 2016-08-29 ENCOUNTER — Encounter: Payer: Self-pay | Admitting: Speech Pathology

## 2016-08-29 ENCOUNTER — Ambulatory Visit: Payer: 59

## 2016-08-29 VITALS — BP 109/79 | HR 71

## 2016-08-29 DIAGNOSIS — R41841 Cognitive communication deficit: Secondary | ICD-10-CM

## 2016-08-29 DIAGNOSIS — R278 Other lack of coordination: Secondary | ICD-10-CM

## 2016-08-29 DIAGNOSIS — M6281 Muscle weakness (generalized): Secondary | ICD-10-CM | POA: Diagnosis not present

## 2016-08-29 DIAGNOSIS — R262 Difficulty in walking, not elsewhere classified: Secondary | ICD-10-CM
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
## 2016-09-06 ENCOUNTER — Ambulatory Visit: Payer: 59 | Admitting: Speech Pathology

## 2016-09-06 ENCOUNTER — Encounter: Payer: Self-pay | Admitting: Speech Pathology

## 2016-09-06 ENCOUNTER — Ambulatory Visit: Payer: 59 | Attending: Family Medicine

## 2016-09-06 DIAGNOSIS — M6281 Muscle weakness (generalized): Secondary | ICD-10-CM | POA: Insufficient documentation

## 2016-09-06 DIAGNOSIS — R278 Other lack of coordination: Secondary | ICD-10-CM | POA: Insufficient documentation

## 2016-09-06 DIAGNOSIS — R41841 Cognitive communication deficit: Secondary | ICD-10-CM

## 2016-09-06 DIAGNOSIS — R262 Difficulty in walking, not elsewhere classified: Secondary | ICD-10-CM | POA: Insufficient documentation

## 2016-09-06 DIAGNOSIS — M25511 Pain in right shoulder: Secondary | ICD-10-CM | POA: Insufficient documentation

## 2016-09-06 DIAGNOSIS — M25611 Stiffness of right shoulder, not elsewhere classified: Secondary | ICD-10-CM | POA: Insufficient documentation
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
## 2016-09-13 ENCOUNTER — Ambulatory Visit: Payer: 59 | Admitting: Speech Pathology

## 2016-09-13 ENCOUNTER — Ambulatory Visit: Payer: 59

## 2016-09-13 DIAGNOSIS — R41841 Cognitive communication deficit: Secondary | ICD-10-CM

## 2016-09-13 DIAGNOSIS — R262 Difficulty in walking, not elsewhere classified: Secondary | ICD-10-CM

## 2016-09-13 DIAGNOSIS — R278 Other lack of coordination: Secondary | ICD-10-CM

## 2016-09-13 DIAGNOSIS — M6281 Muscle weakness (generalized): Secondary | ICD-10-CM | POA: Diagnosis not present
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
## 2016-09-14 ENCOUNTER — Encounter: Payer: Self-pay | Admitting: Speech Pathology
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
## 2016-09-19 ENCOUNTER — Ambulatory Visit: Payer: 59

## 2016-09-19 DIAGNOSIS — R262 Difficulty in walking, not elsewhere classified: Secondary | ICD-10-CM

## 2016-09-19 DIAGNOSIS — M6281 Muscle weakness (generalized): Secondary | ICD-10-CM

## 2016-09-19 DIAGNOSIS — R278 Other lack of coordination: Secondary | ICD-10-CM
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
## 2016-09-20 ENCOUNTER — Encounter: Payer: Self-pay | Admitting: Physical Medicine & Rehabilitation

## 2016-09-20 ENCOUNTER — Encounter
Payer: PRIVATE HEALTH INSURANCE | Attending: Physical Medicine & Rehabilitation | Admitting: Physical Medicine & Rehabilitation

## 2016-09-20 VITALS — BP 128/83 | HR 50 | Resp 14

## 2016-09-20 DIAGNOSIS — R4189 Other symptoms and signs involving cognitive functions and awareness: Secondary | ICD-10-CM | POA: Insufficient documentation

## 2016-09-20 DIAGNOSIS — R1310 Dysphagia, unspecified: Secondary | ICD-10-CM | POA: Insufficient documentation

## 2016-09-20 DIAGNOSIS — S52514D Nondisplaced fracture of right radial styloid process, subsequent encounter for closed fracture with routine healing: Secondary | ICD-10-CM | POA: Insufficient documentation

## 2016-09-20 DIAGNOSIS — F5101 Primary insomnia: Secondary | ICD-10-CM

## 2016-09-20 DIAGNOSIS — G9389 Other specified disorders of brain: Secondary | ICD-10-CM | POA: Diagnosis not present

## 2016-09-20 DIAGNOSIS — N319 Neuromuscular dysfunction of bladder, unspecified: Secondary | ICD-10-CM | POA: Insufficient documentation

## 2016-09-20 DIAGNOSIS — M7581 Other shoulder lesions, right shoulder: Secondary | ICD-10-CM | POA: Diagnosis not present

## 2016-09-20 DIAGNOSIS — S0292XD Unspecified fracture of facial bones, subsequent encounter for fracture with routine healing: Secondary | ICD-10-CM | POA: Diagnosis present

## 2016-09-20 DIAGNOSIS — F068 Other specified mental disorders due to known physiological condition: Secondary | ICD-10-CM | POA: Diagnosis not present

## 2016-09-20 DIAGNOSIS — S069X0S Unspecified intracranial injury without loss of consciousness, sequela: Secondary | ICD-10-CM

## 2016-09-20 DIAGNOSIS — S069X3S Unspecified intracranial injury with loss of consciousness of 1 hour to 5 hours 59 minutes, sequela: Secondary | ICD-10-CM | POA: Diagnosis not present

## 2016-09-20 DIAGNOSIS — Z818 Family history of other mental and behavioral disorders: Secondary | ICD-10-CM | POA: Diagnosis not present

## 2016-09-20 DIAGNOSIS — F0789 Other personality and behavioral disorders due to known physiological condition: Secondary | ICD-10-CM

## 2016-09-20 DIAGNOSIS — Z87891 Personal history of nicotine dependence: Secondary | ICD-10-CM | POA: Insufficient documentation

## 2016-09-20 DIAGNOSIS — S06890D Other specified intracranial injury without loss of consciousness, subsequent encounter: Secondary | ICD-10-CM | POA: Insufficient documentation

## 2016-09-20 DIAGNOSIS — M7501 Adhesive capsulitis of right shoulder: Secondary | ICD-10-CM

## 2016-09-20 DIAGNOSIS — I10 Essential (primary) hypertension: Secondary | ICD-10-CM | POA: Insufficient documentation

## 2016-09-20 DIAGNOSIS — F079 Unspecified personality and behavioral disorder due to known physiological condition: Secondary | ICD-10-CM

## 2016-09-20 DIAGNOSIS — Z8349 Family history of other endocrine, nutritional and metabolic diseases: Secondary | ICD-10-CM | POA: Insufficient documentation

## 2016-09-20 MED ORDER — QUETIAPINE FUMARATE 50 MG PO TABS: 50.0000 mg | ORAL_TABLET | Freq: Every day | ORAL | 2 refills | Status: DC

## 2016-09-20 MED ORDER — METHYLPHENIDATE HCL ER (OSM) 36 MG PO TBCR: 36.0000 mg | EXTENDED_RELEASE_TABLET | Freq: Every day | ORAL | 0 refills | Status: DC
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
## 2016-09-20 NOTE — Patient Instructions (Signed)
PLEASE FEEL FREE TO CALL OUR OFFICE WITH ANY PROBLEMS OR QUESTIONS ([phone])    TAKE YOUR TIME

## 2016-09-26 ENCOUNTER — Ambulatory Visit: Payer: 59

## 2016-09-26 DIAGNOSIS — M6281 Muscle weakness (generalized): Secondary | ICD-10-CM | POA: Diagnosis not present

## 2016-09-26 DIAGNOSIS — M25511 Pain in right shoulder: Secondary | ICD-10-CM

## 2016-09-26 DIAGNOSIS — M25611 Stiffness of right shoulder, not elsewhere classified: Secondary | ICD-10-CM
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
## 2016-09-27 NOTE — Patient Instructions (Signed)
Shoulder supine AAROM with cane: flexion, abduction, and external rotation

## 2016-10-03 ENCOUNTER — Ambulatory Visit: Payer: 59 | Attending: Physical Medicine & Rehabilitation

## 2016-10-03 DIAGNOSIS — M25611 Stiffness of right shoulder, not elsewhere classified: Secondary | ICD-10-CM

## 2016-10-03 DIAGNOSIS — M25511 Pain in right shoulder: Secondary | ICD-10-CM | POA: Diagnosis present

## 2016-10-03 DIAGNOSIS — M6281 Muscle weakness (generalized): Secondary | ICD-10-CM | POA: Insufficient documentation
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
## 2016-10-10 ENCOUNTER — Ambulatory Visit: Payer: 59

## 2016-10-10 DIAGNOSIS — M25611 Stiffness of right shoulder, not elsewhere classified: Secondary | ICD-10-CM | POA: Diagnosis not present

## 2016-10-10 DIAGNOSIS — M6281 Muscle weakness (generalized): Secondary | ICD-10-CM

## 2016-10-10 DIAGNOSIS — M25511 Pain in right shoulder: Secondary | ICD-10-CM
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
## 2016-11-13 ENCOUNTER — Ambulatory Visit (INDEPENDENT_AMBULATORY_CARE_PROVIDER_SITE_OTHER): Payer: 59 | Admitting: Family Medicine

## 2016-11-13 VITALS — BP 112/76 | HR 78 | Temp 98.5°F | Resp 16 | Wt 201.0 lb

## 2016-11-13 DIAGNOSIS — I10 Essential (primary) hypertension: Secondary | ICD-10-CM | POA: Diagnosis not present

## 2016-11-13 DIAGNOSIS — S069X3S Unspecified intracranial injury with loss of consciousness of 1 hour to 5 hours 59 minutes, sequela: Secondary | ICD-10-CM | POA: Diagnosis not present
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
## 2016-11-22 ENCOUNTER — Encounter: Payer: 59 | Attending: Physical Medicine & Rehabilitation | Admitting: Physical Medicine & Rehabilitation

## 2016-11-22 ENCOUNTER — Encounter: Payer: Self-pay | Admitting: Physical Medicine & Rehabilitation

## 2016-11-22 VITALS — BP 125/86 | HR 74

## 2016-11-22 DIAGNOSIS — S069X3S Unspecified intracranial injury with loss of consciousness of 1 hour to 5 hours 59 minutes, sequela: Secondary | ICD-10-CM

## 2016-11-22 DIAGNOSIS — R1310 Dysphagia, unspecified: Secondary | ICD-10-CM | POA: Diagnosis not present

## 2016-11-22 DIAGNOSIS — N319 Neuromuscular dysfunction of bladder, unspecified: Secondary | ICD-10-CM | POA: Insufficient documentation

## 2016-11-22 DIAGNOSIS — S069X0S Unspecified intracranial injury without loss of consciousness, sequela: Secondary | ICD-10-CM | POA: Diagnosis not present

## 2016-11-22 DIAGNOSIS — Z8349 Family history of other endocrine, nutritional and metabolic diseases: Secondary | ICD-10-CM | POA: Diagnosis not present

## 2016-11-22 DIAGNOSIS — I10 Essential (primary) hypertension: Secondary | ICD-10-CM | POA: Insufficient documentation

## 2016-11-22 DIAGNOSIS — Z87891 Personal history of nicotine dependence: Secondary | ICD-10-CM | POA: Diagnosis not present

## 2016-11-22 DIAGNOSIS — Z818 Family history of other mental and behavioral disorders: Secondary | ICD-10-CM | POA: Diagnosis not present

## 2016-11-22 DIAGNOSIS — S0292XD Unspecified fracture of facial bones, subsequent encounter for fracture with routine healing: Secondary | ICD-10-CM | POA: Insufficient documentation

## 2016-11-22 DIAGNOSIS — G9389 Other specified disorders of brain: Secondary | ICD-10-CM

## 2016-11-22 DIAGNOSIS — M7581 Other shoulder lesions, right shoulder: Secondary | ICD-10-CM

## 2016-11-22 DIAGNOSIS — F079 Unspecified personality and behavioral disorder due to known physiological condition: Secondary | ICD-10-CM

## 2016-11-22 DIAGNOSIS — S06890D Other specified intracranial injury without loss of consciousness, subsequent encounter: Secondary | ICD-10-CM | POA: Insufficient documentation

## 2016-11-22 DIAGNOSIS — F068 Other specified mental disorders due to known physiological condition: Secondary | ICD-10-CM | POA: Diagnosis not present

## 2016-11-22 DIAGNOSIS — F0789 Other personality and behavioral disorders due to known physiological condition: Secondary | ICD-10-CM

## 2016-11-22 DIAGNOSIS — F5101 Primary insomnia: Secondary | ICD-10-CM | POA: Diagnosis not present

## 2016-11-22 DIAGNOSIS — S52514D Nondisplaced fracture of right radial styloid process, subsequent encounter for closed fracture with routine healing: Secondary | ICD-10-CM | POA: Diagnosis not present

## 2016-11-22 DIAGNOSIS — R4189 Other symptoms and signs involving cognitive functions and awareness: Secondary | ICD-10-CM | POA: Diagnosis not present

## 2016-11-22 MED ORDER — METHYLPHENIDATE HCL ER (OSM) 36 MG PO TBCR: 36.0000 mg | EXTENDED_RELEASE_TABLET | Freq: Every day | ORAL | 0 refills | Status: DC

## 2016-11-22 MED ORDER — QUETIAPINE FUMARATE 100 MG PO TABS: 100.0000 mg | ORAL_TABLET | Freq: Every day | ORAL | 5 refills | Status: DC

## 2016-11-22 NOTE — Patient Instructions (Signed)
YOU MAY COME BACK IN TWO MONTHS TO PICK UP PRESCRIPTIONS.   YOU MAY DRIVE SHORT DISTANCES NEAR HOME, DURING THE DAY, DURING LOW TRAFFIC TIMES   PLEASE FEEL FREE TO CALL OUR OFFICE WITH ANY PROBLEMS OR QUESTIONS [phone])
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
## 2017-01-09 ENCOUNTER — Telehealth: Payer: Self-pay

## 2017-01-09 DIAGNOSIS — G9389 Other specified disorders of brain: Secondary | ICD-10-CM

## 2017-01-09 DIAGNOSIS — F0789 Other personality and behavioral disorders due to known physiological condition: Secondary | ICD-10-CM

## 2017-01-09 DIAGNOSIS — F068 Other specified mental disorders due to known physiological condition: Secondary | ICD-10-CM

## 2017-01-09 DIAGNOSIS — S069X0S Unspecified intracranial injury without loss of consciousness, sequela: Secondary | ICD-10-CM

## 2017-01-09 DIAGNOSIS — S069X3S Unspecified intracranial injury with loss of consciousness of 1 hour to 5 hours 59 minutes, sequela: Secondary | ICD-10-CM

## 2017-01-09 DIAGNOSIS — G939 Disorder of brain, unspecified: Secondary | ICD-10-CM

## 2017-01-09 MED ORDER — METHYLPHENIDATE HCL ER (OSM) 36 MG PO TBCR: 36.0000 mg | EXTENDED_RELEASE_TABLET | Freq: Every day | ORAL | 0 refills | Status: DC

## 2017-01-09 NOTE — Telephone Encounter (Signed)
Patients wife called clinic today, requesting new prescriptions for concerta, according to last note:  [place]  Printed new prescriptions and placed on your desk to be signed.

## 2017-01-10 NOTE — Telephone Encounter (Signed)
Message passed to patients wife

## 2017-01-10 NOTE — Telephone Encounter (Signed)
Recieved signed prescriptions back from doctor, placed them in binder to be picked up, patients wife also stated on call that he has been having trouble staying asleep and is wondering if it would be ok to be placed back onto the melatonin medication that he was on before.  Please advise

## 2017-01-10 NOTE — Telephone Encounter (Signed)
He may resume melatonin. I did not have him on a prescription med however. It would be otc

## 2017-03-21 ENCOUNTER — Encounter: Payer: Self-pay | Admitting: Physical Medicine & Rehabilitation

## 2017-03-21 ENCOUNTER — Encounter: Payer: 59 | Attending: Physical Medicine & Rehabilitation | Admitting: Physical Medicine & Rehabilitation

## 2017-03-21 VITALS — BP 121/79 | HR 70 | Resp 14

## 2017-03-21 DIAGNOSIS — R1310 Dysphagia, unspecified: Secondary | ICD-10-CM | POA: Insufficient documentation

## 2017-03-21 DIAGNOSIS — S52514D Nondisplaced fracture of right radial styloid process, subsequent encounter for closed fracture with routine healing: Secondary | ICD-10-CM | POA: Insufficient documentation

## 2017-03-21 DIAGNOSIS — N319 Neuromuscular dysfunction of bladder, unspecified: Secondary | ICD-10-CM | POA: Diagnosis not present

## 2017-03-21 DIAGNOSIS — Z818 Family history of other mental and behavioral disorders: Secondary | ICD-10-CM | POA: Diagnosis not present

## 2017-03-21 DIAGNOSIS — S0292XD Unspecified fracture of facial bones, subsequent encounter for fracture with routine healing: Secondary | ICD-10-CM | POA: Diagnosis present

## 2017-03-21 DIAGNOSIS — S069X3S Unspecified intracranial injury with loss of consciousness of 1 hour to 5 hours 59 minutes, sequela: Secondary | ICD-10-CM

## 2017-03-21 DIAGNOSIS — Z87891 Personal history of nicotine dependence: Secondary | ICD-10-CM | POA: Diagnosis not present

## 2017-03-21 DIAGNOSIS — G939 Disorder of brain, unspecified: Secondary | ICD-10-CM

## 2017-03-21 DIAGNOSIS — F0789 Other personality and behavioral disorders due to known physiological condition: Secondary | ICD-10-CM

## 2017-03-21 DIAGNOSIS — Z8349 Family history of other endocrine, nutritional and metabolic diseases: Secondary | ICD-10-CM | POA: Insufficient documentation

## 2017-03-21 DIAGNOSIS — F068 Other specified mental disorders due to known physiological condition: Secondary | ICD-10-CM | POA: Diagnosis not present

## 2017-03-21 DIAGNOSIS — G9389 Other specified disorders of brain: Secondary | ICD-10-CM

## 2017-03-21 DIAGNOSIS — F079 Unspecified personality and behavioral disorder due to known physiological condition: Secondary | ICD-10-CM

## 2017-03-21 DIAGNOSIS — I10 Essential (primary) hypertension: Secondary | ICD-10-CM | POA: Diagnosis not present

## 2017-03-21 DIAGNOSIS — R4189 Other symptoms and signs involving cognitive functions and awareness: Secondary | ICD-10-CM | POA: Diagnosis not present

## 2017-03-21 DIAGNOSIS — S06890D Other specified intracranial injury without loss of consciousness, subsequent encounter: Secondary | ICD-10-CM | POA: Insufficient documentation

## 2017-03-21 DIAGNOSIS — S069X0S Unspecified intracranial injury without loss of consciousness, sequela: Secondary | ICD-10-CM | POA: Diagnosis not present

## 2017-03-21 MED ORDER — METHYLPHENIDATE HCL ER (OSM) 36 MG PO TBCR: 36.0000 mg | EXTENDED_RELEASE_TABLET | Freq: Every day | ORAL | 0 refills | Status: DC

## 2017-03-21 NOTE — Patient Instructions (Signed)
PLEASE FEEL FREE TO CALL OUR OFFICE WITH ANY PROBLEMS OR QUESTIONS ([phone])  HAVE A HAPPY HOLIDAYS!                     ^                  ^^                ^ ^ ^             ^ ^ ^ ^ ^           ^ ^ ^ ^ ^ ^ ^        ^ ^ ^ ^ ^ ^ ^ ^ ^      ^ ^ ^ ^ ^ ^ ^ ^ ^ ^ ^                ^^^^                ^^^^                ^^^^     
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
## 2017-04-19 ENCOUNTER — Telehealth: Payer: Self-pay

## 2017-04-19 NOTE — Telephone Encounter (Signed)
Patients wife called stating that she is not exactly sure what is going on with patient. She states that he is acting different and has been very mean to her. She stated that he has an appointment with the neurologist on wed but is concerned and wanted to see if she needs to bring him to see Dr. Naaman Plummer before that appointment. Please advise.

## 2017-04-20 NOTE — Telephone Encounter (Signed)
Contacted patient's wife.  She said yesterday appears to have been an Associate Professor. She is going to wait and see if his mood flares up again.  She will call us again if needed

## 2017-04-20 NOTE — Telephone Encounter (Signed)
I would happy to see him in the office if he can be worked in somehow.  Certainly I have a cancellation or something that he can fit into next week.  I suspect something else is going on to cause those changes although he always has been a bit disinhibited.

## 2017-04-27 ENCOUNTER — Other Ambulatory Visit: Payer: Self-pay | Admitting: Family Medicine

## 2017-05-11 ENCOUNTER — Telehealth: Payer: Self-pay

## 2017-05-11 DIAGNOSIS — G939 Disorder of brain, unspecified: Secondary | ICD-10-CM

## 2017-05-11 DIAGNOSIS — F0789 Other personality and behavioral disorders due to known physiological condition: Secondary | ICD-10-CM

## 2017-05-11 DIAGNOSIS — S069X0S Unspecified intracranial injury without loss of consciousness, sequela: Secondary | ICD-10-CM

## 2017-05-11 DIAGNOSIS — R4189 Other symptoms and signs involving cognitive functions and awareness: Secondary | ICD-10-CM

## 2017-05-11 DIAGNOSIS — F068 Other specified mental disorders due to known physiological condition: Secondary | ICD-10-CM

## 2017-05-11 DIAGNOSIS — G9389 Other specified disorders of brain: Secondary | ICD-10-CM

## 2017-05-11 DIAGNOSIS — S069X3S Unspecified intracranial injury with loss of consciousness of 1 hour to 5 hours 59 minutes, sequela: Secondary | ICD-10-CM

## 2017-05-11 NOTE — Telephone Encounter (Signed)
That's fine. Does he only need one?

## 2017-05-11 NOTE — Telephone Encounter (Signed)
Patients wife called this morning requesting a refill of concerta that she can pick up on Tuesday morning 05-15-2017

## 2017-05-14 NOTE — Telephone Encounter (Addendum)
His next appt is 07/18/17.  I would assume they need two prescriptions to get to that appt. Will this be printed or sent in electronically, so I can let her know?

## 2017-05-14 NOTE — Telephone Encounter (Signed)
Checked PMP website, last fill date was 04-24-2017, patient will need script for march and April.

## 2017-05-15 MED ORDER — METHYLPHENIDATE HCL ER (OSM) 36 MG PO TBCR: 36.0000 mg | EXTENDED_RELEASE_TABLET | Freq: Every day | ORAL | 0 refills | Status: DC

## 2017-05-15 NOTE — Telephone Encounter (Signed)
[name]  was filled on 04/24/2017 according to the PMP Aware Site . Two prescriptions of the Fox was sent to Pharmacy. Placed a called to [name], no answer to inform him of the above. No answer, left message to return the call.

## 2017-05-16 ENCOUNTER — Ambulatory Visit (INDEPENDENT_AMBULATORY_CARE_PROVIDER_SITE_OTHER): Payer: 59 | Admitting: Family Medicine

## 2017-05-16 ENCOUNTER — Other Ambulatory Visit: Payer: Self-pay

## 2017-05-16 VITALS — BP 120/80 | HR 68 | Temp 97.8°F | Resp 16 | Ht 73.0 in | Wt 211.0 lb

## 2017-05-16 DIAGNOSIS — I10 Essential (primary) hypertension: Secondary | ICD-10-CM

## 2017-05-16 DIAGNOSIS — Z1211 Encounter for screening for malignant neoplasm of colon: Secondary | ICD-10-CM | POA: Diagnosis not present

## 2017-05-16 DIAGNOSIS — Z0000 Encounter for general adult medical examination without abnormal findings: Secondary | ICD-10-CM

## 2017-05-16 DIAGNOSIS — Z125 Encounter for screening for malignant neoplasm of prostate: Secondary | ICD-10-CM | POA: Diagnosis not present

## 2017-05-16 LAB — POCT URINALYSIS DIPSTICK
BILIRUBIN UA: NEGATIVE
GLUCOSE UA: NEGATIVE
Ketones, UA: NEGATIVE
Leukocytes, UA: NEGATIVE
Nitrite, UA: NEGATIVE
Protein, UA: NEGATIVE
RBC UA: NEGATIVE
Spec Grav, UA: 1.015 (ref 1.010–1.025)
Urobilinogen, UA: 0.2 E.U./dL
pH, UA: 6 (ref 5.0–8.0)
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
## 2017-05-18 LAB — CBC WITH DIFFERENTIAL/PLATELET
BASOS ABS: 0.1 10*3/uL (ref 0.0–0.2)
Basos: 1 %
EOS (ABSOLUTE): 0.9 10*3/uL — ABNORMAL HIGH (ref 0.0–0.4)
Eos: 14 %
Hematocrit: 42.5 % (ref 37.5–51.0)
Hemoglobin: 14.2 g/dL (ref 13.0–17.7)
IMMATURE GRANULOCYTES: 0 %
Immature Grans (Abs): 0 10*3/uL (ref 0.0–0.1)
LYMPHS ABS: 1.9 10*3/uL (ref 0.7–3.1)
Lymphs: 29 %
MCH: 30.5 pg (ref 26.6–33.0)
MCHC: 33.4 g/dL (ref 31.5–35.7)
MCV: 91 fL (ref 79–97)
MONOCYTES: 7 %
MONOS ABS: 0.5 10*3/uL (ref 0.1–0.9)
NEUTROS PCT: 49 %
Neutrophils Absolute: 3.3 10*3/uL (ref 1.4–7.0)
Platelets: 261 10*3/uL (ref 150–379)
RBC: 4.66 x10E6/uL (ref 4.14–5.80)
RDW: 12.4 % (ref 12.3–15.4)
WBC: 6.6 10*3/uL (ref 3.4–10.8)

## 2017-05-18 LAB — TSH: TSH: 1.75 u[IU]/mL (ref 0.450–4.500)

## 2017-05-18 LAB — COMPREHENSIVE METABOLIC PANEL
ALK PHOS: 72 IU/L (ref 39–117)
ALT: 25 IU/L (ref 0–44)
AST: 20 IU/L (ref 0–40)
Albumin/Globulin Ratio: 1.7 (ref 1.2–2.2)
Albumin: 4.5 g/dL (ref 3.5–5.5)
BUN/Creatinine Ratio: 16 (ref 9–20)
BUN: 19 mg/dL (ref 6–24)
Bilirubin Total: 0.2 mg/dL (ref 0.0–1.2)
CALCIUM: 9.7 mg/dL (ref 8.7–10.2)
CO2: 23 mmol/L (ref 20–29)
CREATININE: 1.18 mg/dL (ref 0.76–1.27)
Chloride: 102 mmol/L (ref 96–106)
GFR calc Af Amer: 79 mL/min/{1.73_m2} (ref >59)
GFR calc non Af Amer: 68 mL/min/{1.73_m2} (ref >59)
GLUCOSE: 96 mg/dL (ref 65–99)
Globulin, Total: 2.6 g/dL (ref 1.5–4.5)
Potassium: 4.3 mmol/L (ref 3.5–5.2)
Sodium: 141 mmol/L (ref 134–144)
Total Protein: 7.1 g/dL (ref 6.0–8.5)

## 2017-05-18 LAB — LIPID PANEL WITH LDL/HDL RATIO
CHOLESTEROL TOTAL: 159 mg/dL (ref 100–199)
HDL: 40 mg/dL (ref >39)
LDL CALC: 100 mg/dL — AB (ref 0–99)
LDl/HDL Ratio: 2.5 ratio (ref 0.0–3.6)
TRIGLYCERIDES: 97 mg/dL (ref 0–149)
VLDL CHOLESTEROL CAL: 19 mg/dL (ref 5–40)

## 2017-05-18 LAB — PSA: Prostate Specific Ag, Serum: 0.7 ng/mL (ref 0.0–4.0)

## 2017-05-19 ENCOUNTER — Other Ambulatory Visit: Payer: Self-pay | Admitting: Physical Medicine & Rehabilitation

## 2017-05-19 DIAGNOSIS — F5101 Primary insomnia: Secondary | ICD-10-CM

## 2017-05-19 DIAGNOSIS — G939 Disorder of brain, unspecified: Secondary | ICD-10-CM

## 2017-05-19 DIAGNOSIS — F0789 Other personality and behavioral disorders due to known physiological condition: Secondary | ICD-10-CM

## 2017-05-19 DIAGNOSIS — G9389 Other specified disorders of brain: Secondary | ICD-10-CM

## 2017-05-19 DIAGNOSIS — S069X3S Unspecified intracranial injury with loss of consciousness of 1 hour to 5 hours 59 minutes, sequela: Secondary | ICD-10-CM

## 2017-05-21 ENCOUNTER — Telehealth: Payer: Self-pay

## 2017-05-21 NOTE — Telephone Encounter (Signed)
[id number] 

## 2017-05-21 NOTE — Telephone Encounter (Signed)
-----   Message from Jerrol Banana., MD sent at 05/18/2017  1:30 PM EST ----- Labs ok.

## 2017-05-21 NOTE — Telephone Encounter (Signed)
Patients wife has been advised. KW 

## 2017-07-18 ENCOUNTER — Ambulatory Visit: Payer: PRIVATE HEALTH INSURANCE | Admitting: Physical Medicine & Rehabilitation

## 2017-07-25 ENCOUNTER — Other Ambulatory Visit: Payer: Self-pay

## 2017-07-25 ENCOUNTER — Encounter: Payer: 59 | Attending: Physical Medicine & Rehabilitation | Admitting: Physical Medicine & Rehabilitation

## 2017-07-25 ENCOUNTER — Encounter: Payer: Self-pay | Admitting: Physical Medicine & Rehabilitation

## 2017-07-25 VITALS — BP 121/75 | HR 56 | Ht 73.0 in | Wt 200.0 lb

## 2017-07-25 DIAGNOSIS — S069X0S Unspecified intracranial injury without loss of consciousness, sequela: Secondary | ICD-10-CM | POA: Diagnosis not present

## 2017-07-25 DIAGNOSIS — F068 Other specified mental disorders due to known physiological condition: Secondary | ICD-10-CM | POA: Diagnosis not present

## 2017-07-25 DIAGNOSIS — G9389 Other specified disorders of brain: Secondary | ICD-10-CM

## 2017-07-25 DIAGNOSIS — Z87891 Personal history of nicotine dependence: Secondary | ICD-10-CM | POA: Insufficient documentation

## 2017-07-25 DIAGNOSIS — F079 Unspecified personality and behavioral disorder due to known physiological condition: Secondary | ICD-10-CM

## 2017-07-25 DIAGNOSIS — M7501 Adhesive capsulitis of right shoulder: Secondary | ICD-10-CM | POA: Diagnosis not present

## 2017-07-25 DIAGNOSIS — Z818 Family history of other mental and behavioral disorders: Secondary | ICD-10-CM | POA: Diagnosis not present

## 2017-07-25 DIAGNOSIS — M75101 Unspecified rotator cuff tear or rupture of right shoulder, not specified as traumatic: Secondary | ICD-10-CM | POA: Diagnosis not present

## 2017-07-25 DIAGNOSIS — S069X3S Unspecified intracranial injury with loss of consciousness of 1 hour to 5 hours 59 minutes, sequela: Secondary | ICD-10-CM | POA: Diagnosis not present

## 2017-07-25 DIAGNOSIS — I10 Essential (primary) hypertension: Secondary | ICD-10-CM | POA: Insufficient documentation

## 2017-07-25 DIAGNOSIS — S0292XD Unspecified fracture of facial bones, subsequent encounter for fracture with routine healing: Secondary | ICD-10-CM | POA: Insufficient documentation

## 2017-07-25 DIAGNOSIS — S069X0D Unspecified intracranial injury without loss of consciousness, subsequent encounter: Secondary | ICD-10-CM | POA: Diagnosis not present

## 2017-07-25 DIAGNOSIS — F0789 Other personality and behavioral disorders due to known physiological condition: Secondary | ICD-10-CM | POA: Diagnosis not present

## 2017-07-25 DIAGNOSIS — W12XXXD Fall on and from scaffolding, subsequent encounter: Secondary | ICD-10-CM | POA: Insufficient documentation

## 2017-07-25 DIAGNOSIS — Z8349 Family history of other endocrine, nutritional and metabolic diseases: Secondary | ICD-10-CM | POA: Insufficient documentation

## 2017-07-25 MED ORDER — METHYLPHENIDATE HCL ER (OSM) 36 MG PO TBCR: 36.0000 mg | EXTENDED_RELEASE_TABLET | Freq: Every day | ORAL | 0 refills | Status: DC

## 2017-07-25 NOTE — Progress Notes (Signed)
Subjective:    Patient ID: Justin Fox, male    DOB: 1960/12/25, 57 y.o.   MRN: [id number]  HPI   Justin Fox is here in follow up of his TBI. He has been increasingly active since I saw him. He has a fit bit and is recording his steps. He hikes with his church. Has lost weight. He has had issues with pollen this spring with a lot of congestion/rhinorrhea. He reports runny nose year round however, just worse now.   He has done some driving with his wife. Most of it has been local. He has done well. He is not driving alone yet  He says his "adrenaline" is back. He has more energyin general. His sleep is improved but he's still going to bed around 8pm and wakes up in the early hours of morning as a result on occasion.     Pain Inventory Average Pain 0 Pain Right Now 0 My pain is n/a  In the last 24 hours, has pain interfered with the following? General activity 0 Relation with others 0 Enjoyment of life 0 What TIME of day is your pain at its worst? n/a Sleep (in general) n/a  Pain is worse with: n/a Pain improves with: n/a Relief from Meds: n/a  Mobility walk without assistance how many minutes can you walk? 50 ability to climb steps?  yes do you drive?  yes  Function disabled: date disabled 11/23/15  Neuro/Psych No problems in this area  Prior Studies Any changes since last visit?  no  Physicians involved in your care Any changes since last visit?  yes   Family History  Problem Relation Age of Onset  . Anxiety disorder Mother   . Thyroid disease Mother    Social History   Socioeconomic History  . Marital status: Married    Spouse name: Not on file  . Number of children: Not on file  . Years of education: Not on file  . Highest education level: Not on file  Occupational History  . Not on file  Social Needs  . Financial resource strain: Not on file  . Food insecurity:    Worry: Not on file    Inability: Not on file  . Transportation needs:   Medical: Not on file    Non-medical: Not on file  Tobacco Use  . Smoking status: Former Smoker    Packs/day: 1.50    Years: 35.00    Pack years: 52.50    Types: Cigarettes    Last attempt to quit: 04/03/2008    Years since quitting: 9.3  . Smokeless tobacco: Never Used  . Tobacco comment: Has been quit for 7-8 years  Substance and Sexual Activity  . Alcohol use: No    Comment: Has had issues with this in the past. Has not drank in 7-8 years.  . Drug use: No  . Sexual activity: Never  Lifestyle  . Physical activity:    Days per week: Not on file    Minutes per session: Not on file  . Stress: Not on file  Relationships  . Social connections:    Talks on phone: Not on file    Gets together: Not on file    Attends religious service: Not on file    Active member of club or organization: Not on file    Attends meetings of clubs or organizations: Not on file    Relationship status: Not on file  Other Topics Concern  . Not on file  Social History Narrative  . Not on file   Past Surgical History:  Procedure Laterality Date  . COLONOSCOPY WITH PROPOFOL N/A 10/12/2015   Procedure: COLONOSCOPY WITH PROPOFOL;  Surgeon: Lucilla Lame, MD;  Location: [facility] ENDOSCOPY;  Service: Endoscopy;  Laterality: N/A;  . NO PAST SURGERIES     Past Medical History:  Diagnosis Date  . Hypertension    BP 121/75   Pulse (!) 56   Ht 6\' 1"  (1.854 m) Comment: pt reported  Wt 200 lb (90.7 kg)   SpO2 98%   BMI 26.39 kg/m   Opioid Risk Score:   Fall Risk Score:  `1  Depression screen PHQ 2/9  Depression screen [facility] 2/9 07/25/2017 05/16/2017 05/24/2016 02/11/2016 08/11/2015  Decreased Interest 0 0 0 0 0  Down, Depressed, Hopeless 0 0 0 0 0  PHQ - 2 Score 0 0 0 0 0  Altered sleeping - - - 0 -  Tired, decreased energy - - - 0 -  Change in appetite - - - 0 -  Feeling bad or failure about yourself  - - - 0 -  Trouble concentrating - - - 0 -  Moving slowly or fidgety/restless - - - 0 -  Suicidal  thoughts - - - 0 -  PHQ-9 Score - - - 0 -   Review of Systems  Constitutional: Negative.   HENT: Negative.   Eyes: Negative.   Respiratory: Negative.   Cardiovascular: Negative.   Gastrointestinal: Negative.   Endocrine: Negative.   Genitourinary: Negative.   Musculoskeletal: Negative.   Skin: Negative.   Allergic/Immunologic: Negative.   Neurological: Negative.   Hematological: Negative.   Psychiatric/Behavioral: Negative.   All other systems reviewed and are negative.      Objective:   Physical Exam  General: No acute distress HEENT: EOMI, oral membranes moist Cards: reg rate  Chest: normal effort Abdomen: Soft, NT, ND Skin: dry, intact Extremities: no edema  Neurological: He is alert and appropriate. Motor: 5.5  All 4's Sens: normal Musc:right shoulder moving freely.   Gait: stable.  Skin: Warm and dry. Intact.  Psych: sl impulsive but appropriate. Cognitive:improved memory and awareness.   Assessment & Plan:    1. 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  -continue HEP.           -gave driving protocol to he and his wife. If he does well, he may return to driving.  2. psch: -can continue lamictal for mood control.  He is showing better social awareness in general -concerta 36mg  daily. Second rx given for next month -melatonin ok for sleep -continue seroquel At 100mg  qhs. Take 15 minutes before bed -Sleeping and sleep hygiene has improved.  needs to go to bed a little later 3. HTN perPCP  4. Nondisplaced right radial styloid fracture.  -follow up per ortho, healed  5. Right rotator cuff syndrome/adhesive capsulitis:  -naproxen---may use prn   25minutes of face to face patient care time were spent during this visit. All questions were encouraged and answered. Follow up  with me in about4 months.Marland Kitchen

## 2017-07-25 NOTE — Patient Instructions (Addendum)
TRY A DECONGESTANT SPRAY TO DRY NOSE ?ENT CONSULT     RETURN TO DRIVING PLAN:  WITH THE SUPERVISION OF A LICENSED DRIVER, PLEASE DRIVE IN AN EMPTY PARKING LOT FOR AT LEAST 2-3 TRIALS TO TEST REACTION TIME, VISION, USE OF EQUIPMENT IN CAR, ETC.  IF SUCCESSFUL WITH THE PARKING LOT DRIVING, PROCEED TO SUPERVISED DRIVING TRIALS IN YOUR NEIGHBORHOOD STREETS AT LOW TRAFFIC TIMES TO TEST OBSERVATION TO TRAFFIC SIGNALS, REACTION TIME, ETC. PLEASE ATTEMPT AT LEAST 2-3 TRIALS IN YOUR NEIGHBORHOOD.  IF NEIGHBORHOOD DRIVING IS SUCCESSFUL, YOU MAY PROCEED TO DRIVING IN BUSIER AREAS IN YOUR COMMUNITY WITH SUPERVISION OF A LICENSED DRIVER. PLEASE ATTEMPT AT LEAST 4-5 TRIALS.  IF COMMUNITY DRIVING IS SUCCESSFUL, YOU MAY PROCEED TO DRIVING ALONE, DURING THE DAY TIME, IN NON-PEAK TRAFFIC TIMES. YOU SHOULD DRIVE NO FURTHER THAN 30 MINUTES IN ONE DIRECTION. PLEASE DO NOT DRIVE IF YOU FEEL FATIGUED OR UNDER THE INFLUENCE OF MEDICATION.

## 2017-07-31 IMAGING — DX DG FOREARM 2V*L*
2 series · 2 of 2 positions shown · non-contrast
Comparison: None.

CLINICAL DATA: Left radial shaft fracture.

EXAM:
LEFT FOREARM - 2 VIEW

[x forearm ap left]
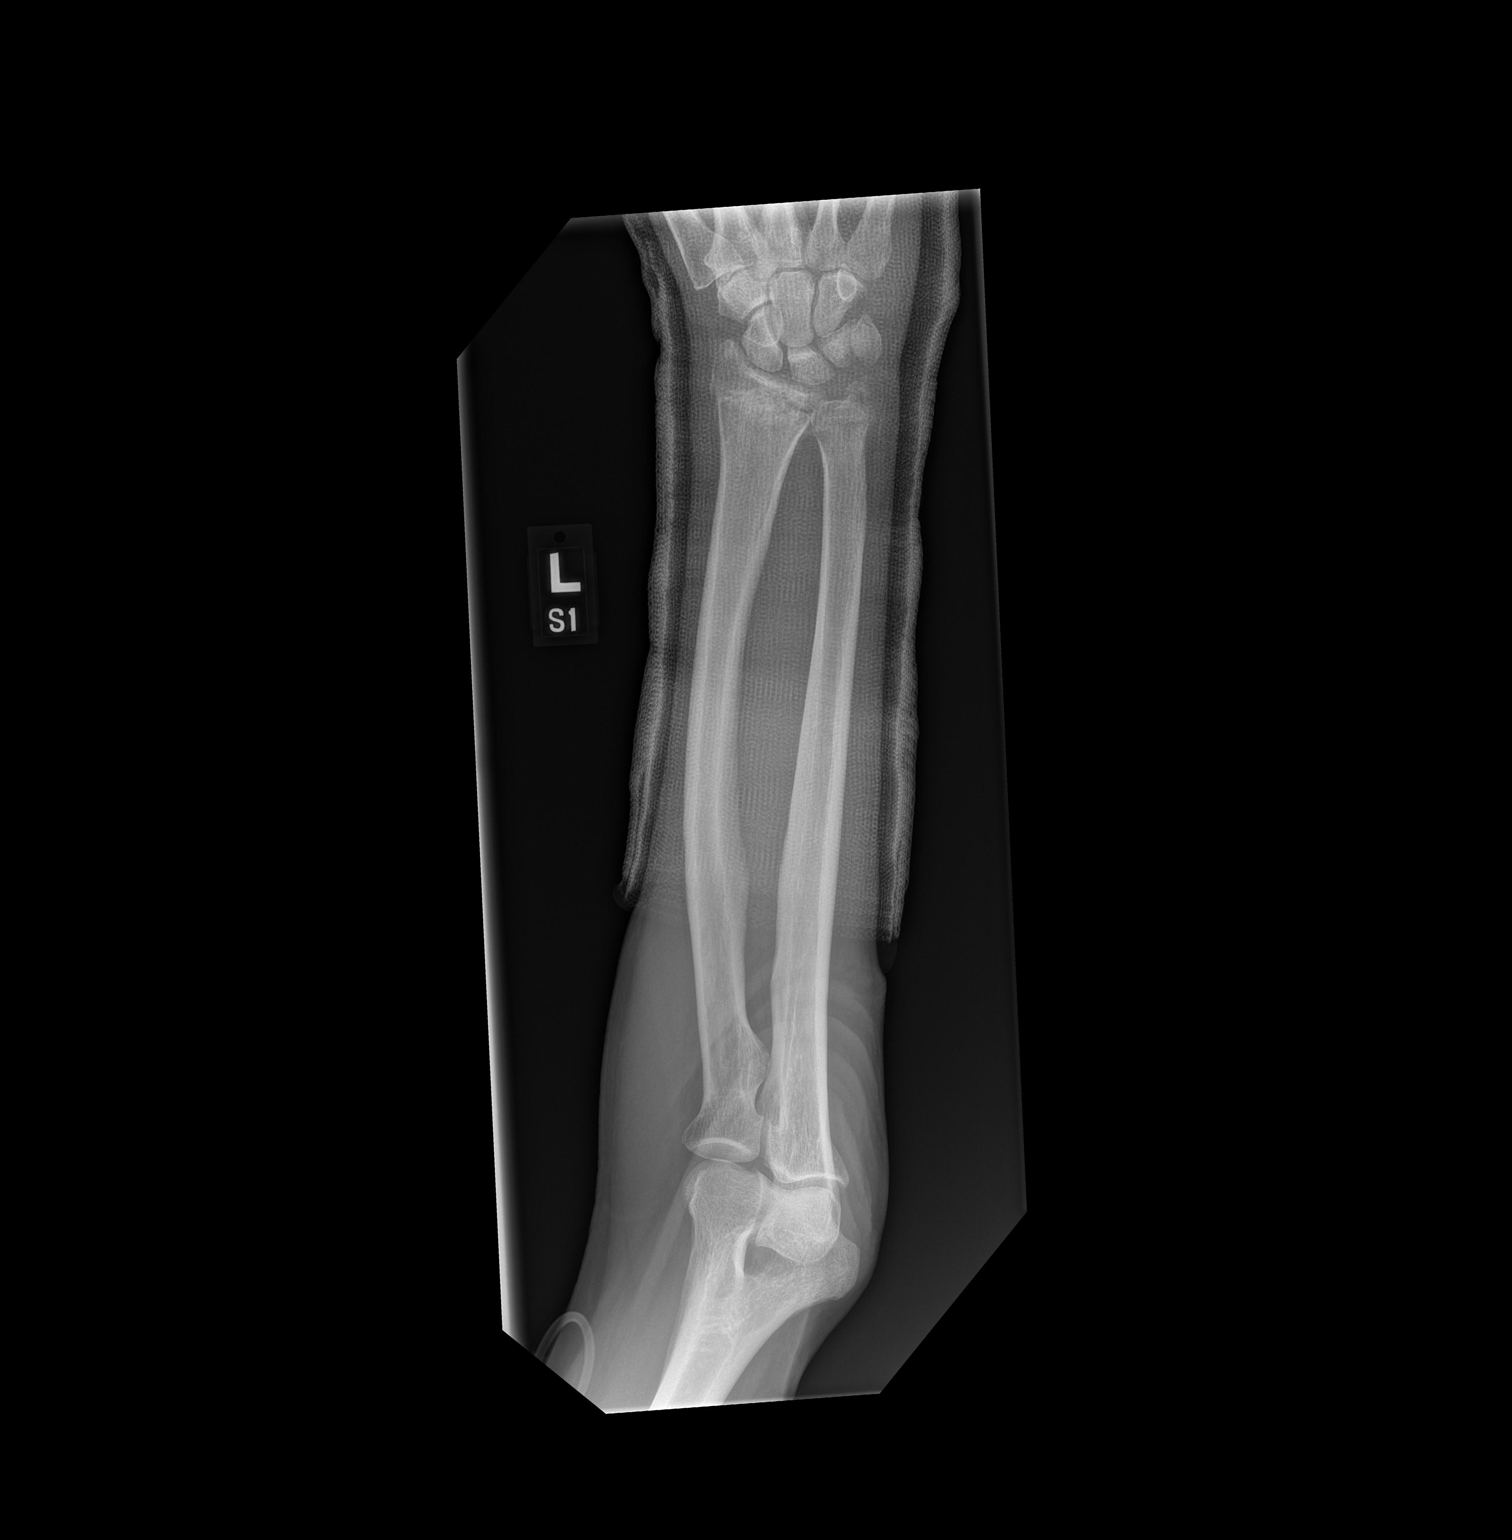

[x forearm lat left]
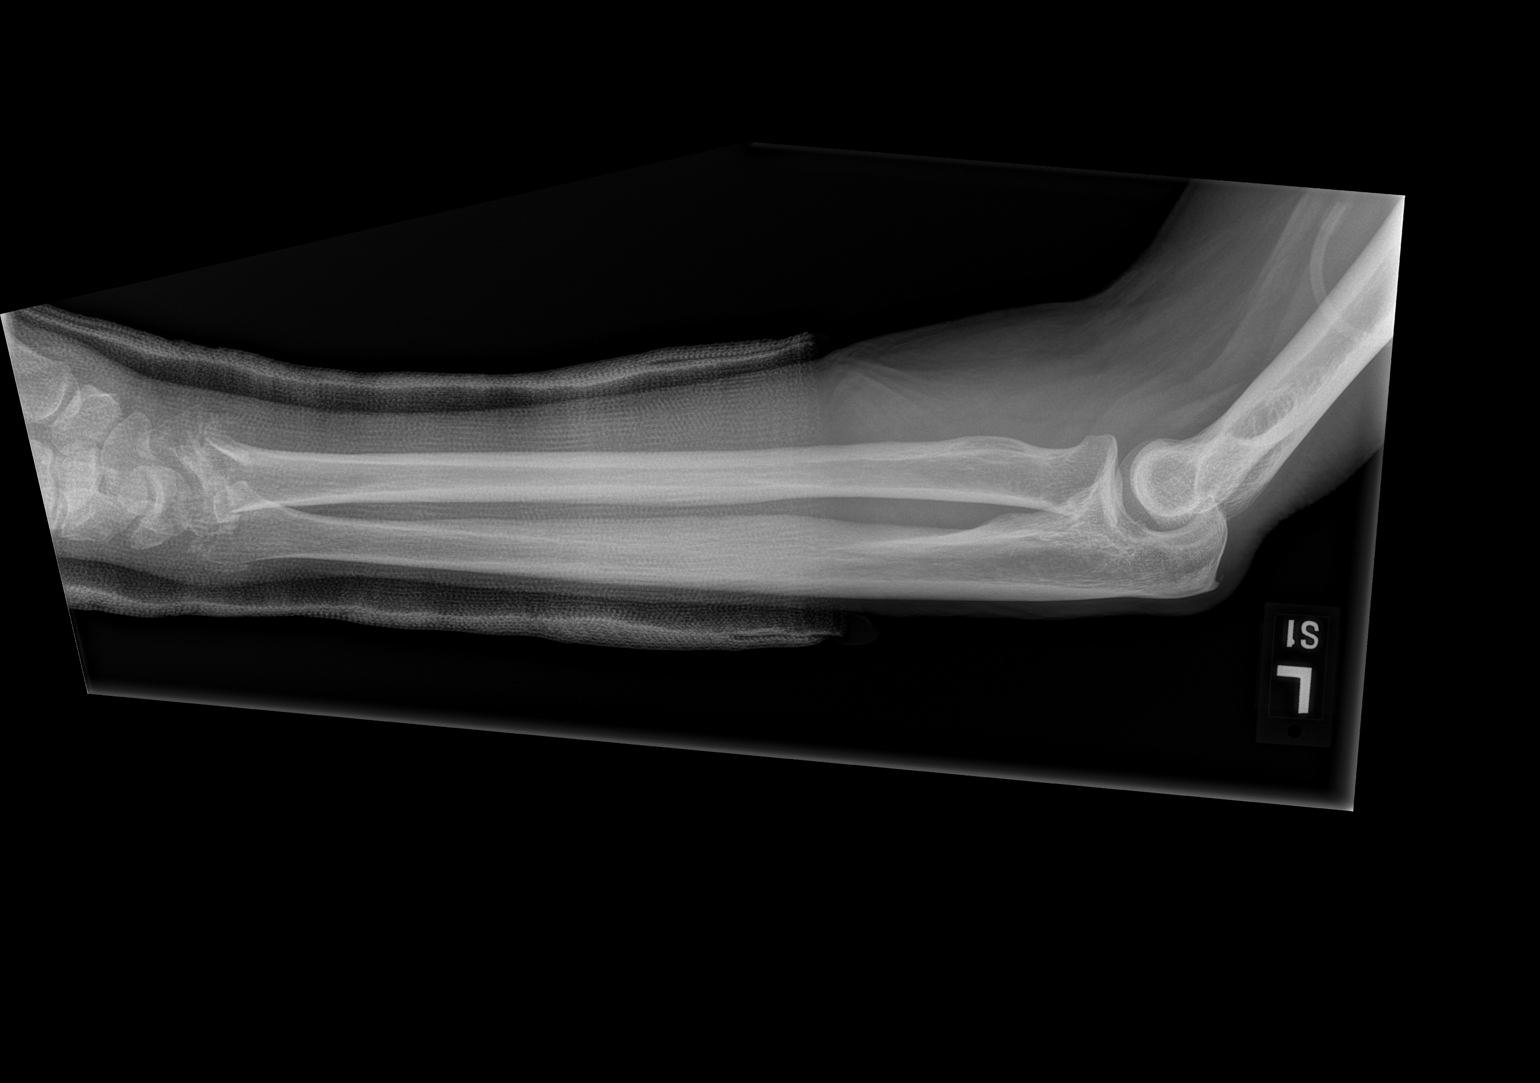

[2 of 2 positions shown; findings below may reference images not displayed]

FINDINGS: The distal left forearm has been casted and immobilized. Mildly
displaced ulnar styloid fracture is noted. Lucency and sclerosis is
seen involving the distal left radius consistent with healing
fracture. Some callus formation is noted. Good alignment of fracture
components is noted.
IMPRESSION: Mildly displaced ulnar styloid fracture. Heaving fracture involving
the distal left radius.

## 2017-07-31 IMAGING — DX DG FOREARM 2V*R*
2 series · 2 of 2 positions shown · non-contrast
Comparison: None.

CLINICAL DATA: Right radial styloid fracture.

EXAM:
RIGHT FOREARM - 2 VIEW

[x forearm ap right]
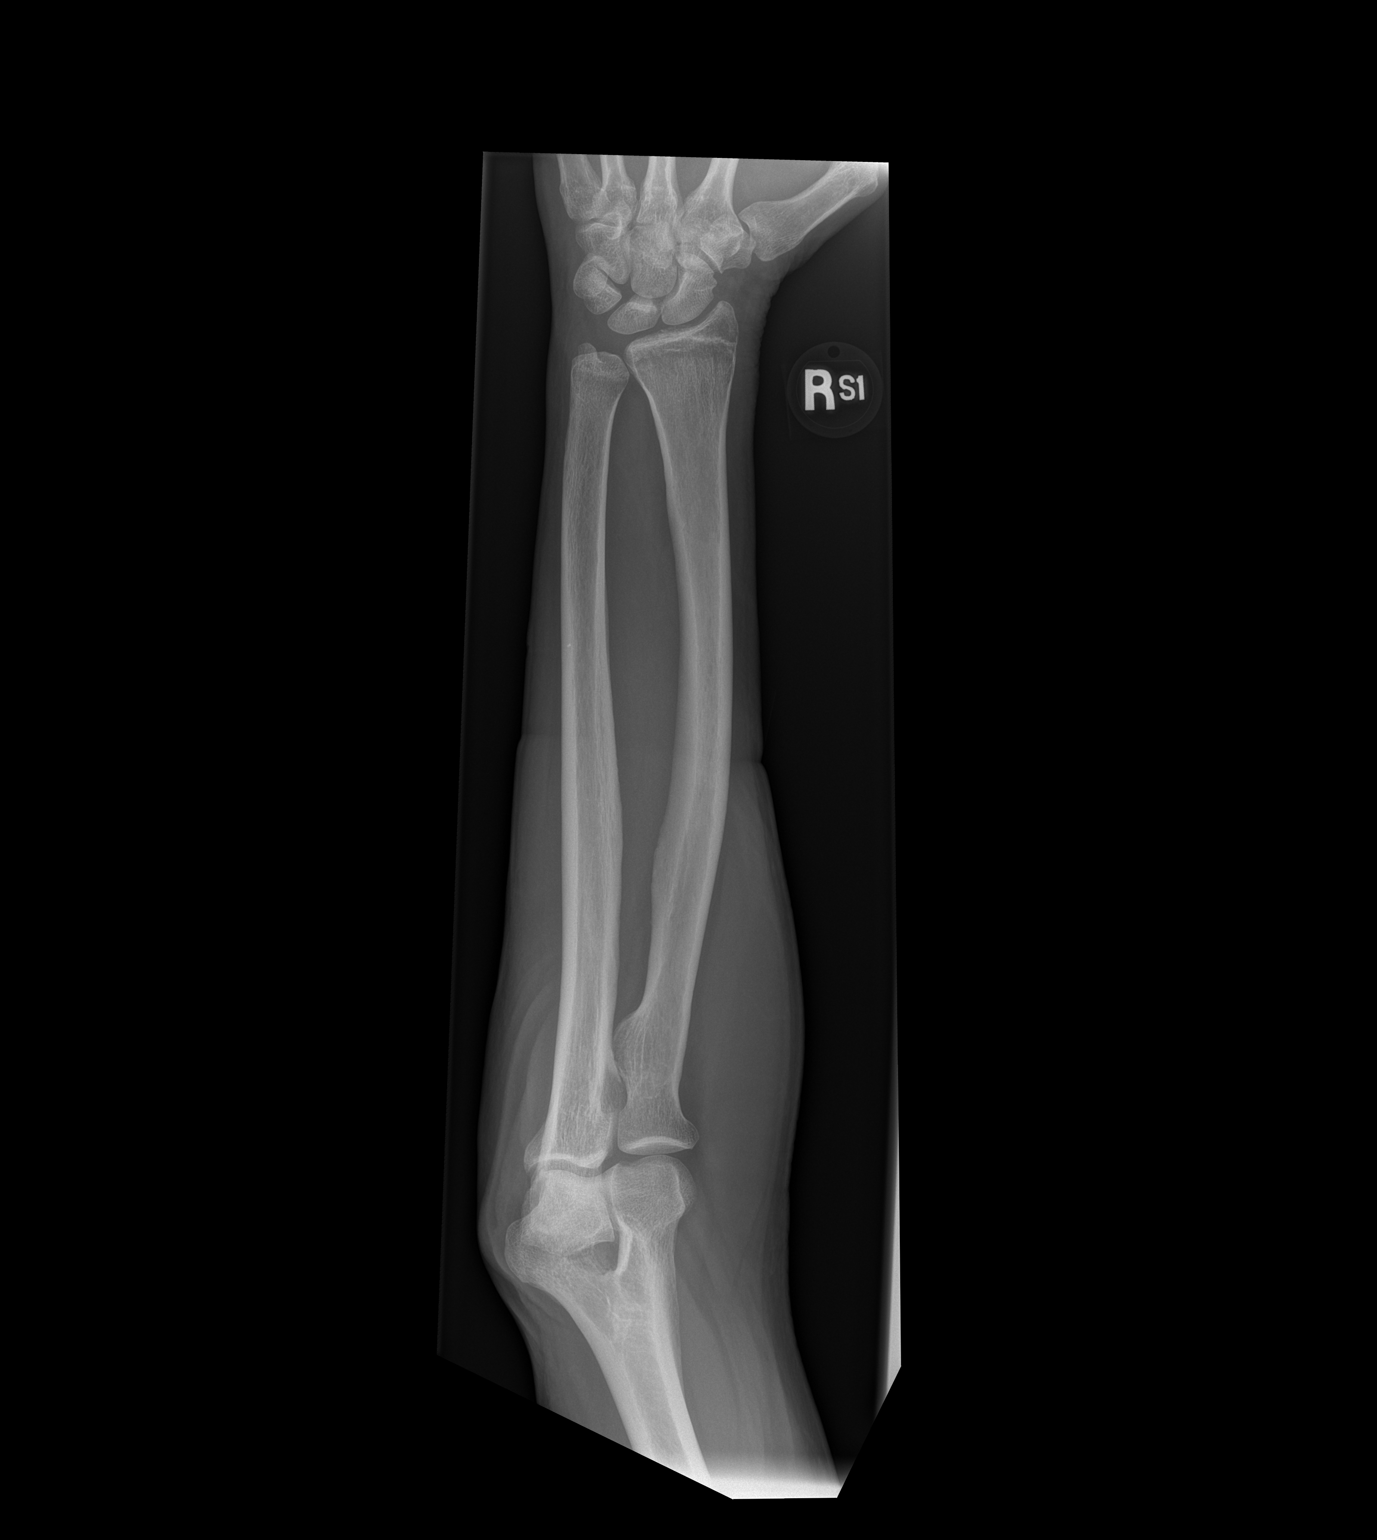

[x forearm lat right]
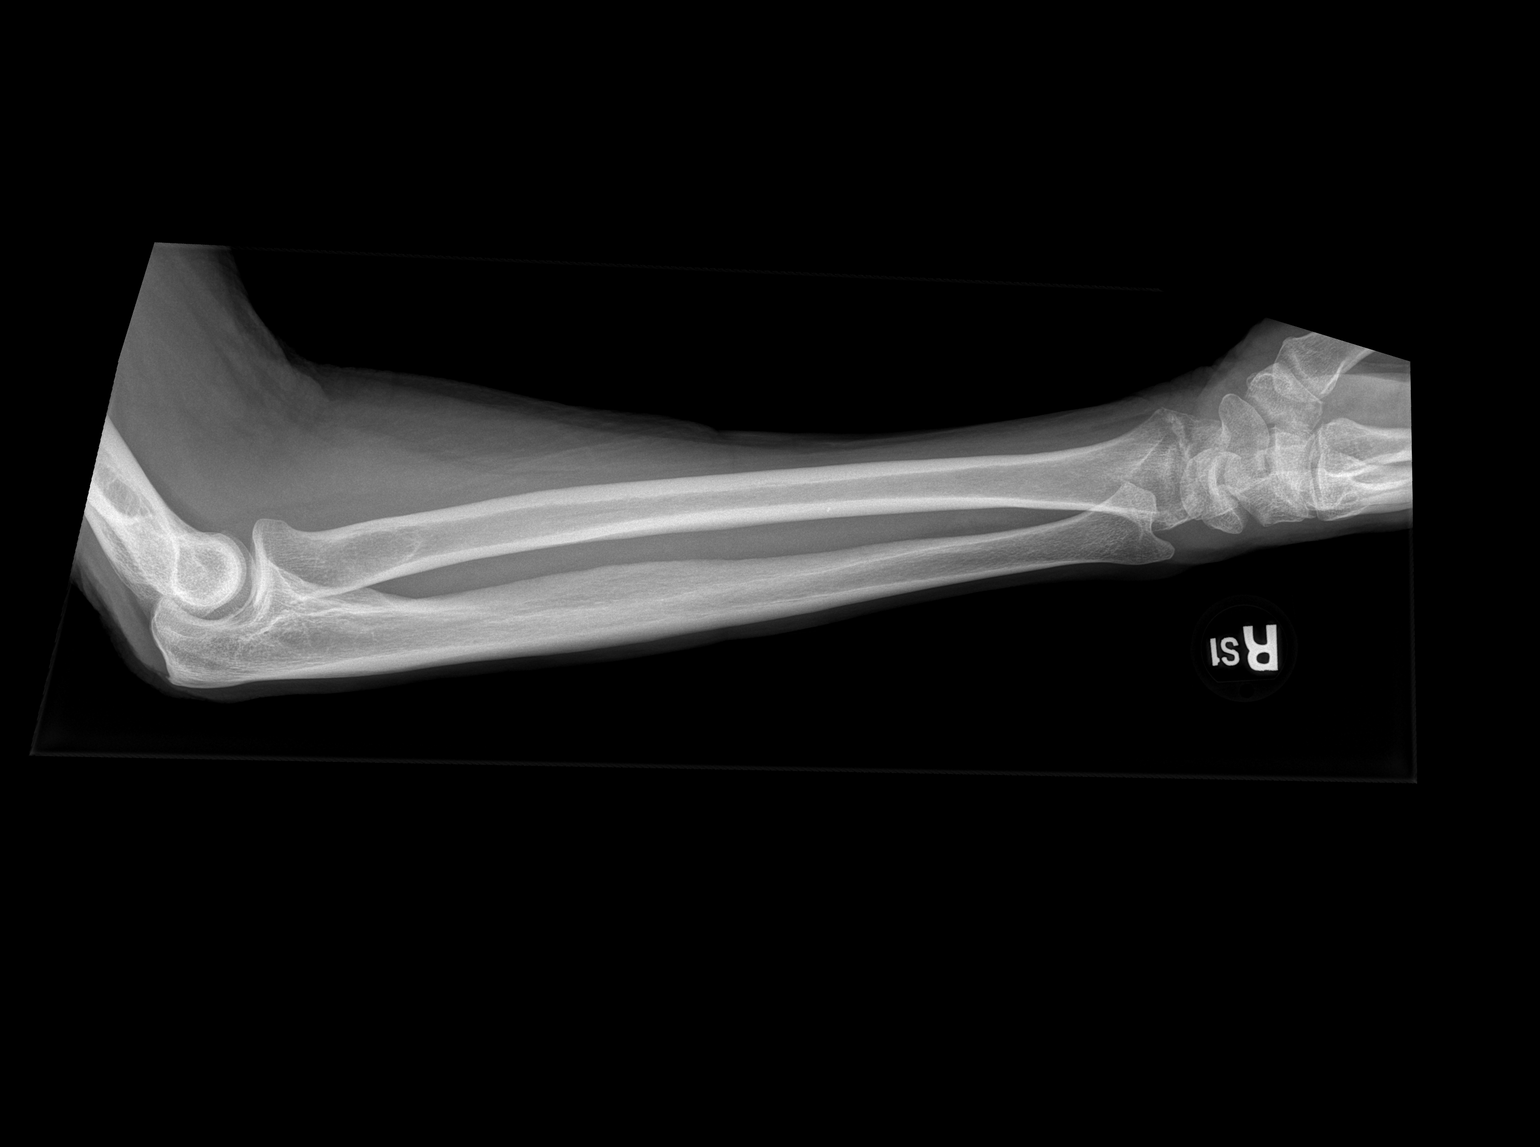

[2 of 2 positions shown; findings below may reference images not displayed]

FINDINGS: Linear sclerosis is seen around the radial styloid consistent with
healed fracture. No acute fracture or dislocation is noted. No soft
tissue abnormality is noted.
IMPRESSION: Healed radial styloid fracture.  No acute abnormality seen.

## 2017-09-24 ENCOUNTER — Telehealth: Payer: Self-pay

## 2017-09-24 DIAGNOSIS — S069X0S Unspecified intracranial injury without loss of consciousness, sequela: Secondary | ICD-10-CM

## 2017-09-24 DIAGNOSIS — G9389 Other specified disorders of brain: Secondary | ICD-10-CM

## 2017-09-24 DIAGNOSIS — S069X3S Unspecified intracranial injury with loss of consciousness of 1 hour to 5 hours 59 minutes, sequela: Secondary | ICD-10-CM

## 2017-09-24 DIAGNOSIS — R4189 Other symptoms and signs involving cognitive functions and awareness: Secondary | ICD-10-CM

## 2017-09-24 DIAGNOSIS — F068 Other specified mental disorders due to known physiological condition: Secondary | ICD-10-CM

## 2017-09-24 DIAGNOSIS — G939 Disorder of brain, unspecified: Secondary | ICD-10-CM

## 2017-09-24 DIAGNOSIS — F0789 Other personality and behavioral disorders due to known physiological condition: Secondary | ICD-10-CM

## 2017-09-24 MED ORDER — METHYLPHENIDATE HCL ER (OSM) 36 MG PO TBCR: 36.0000 mg | EXTENDED_RELEASE_TABLET | Freq: Every day | ORAL | 0 refills | Status: DC

## 2017-09-24 NOTE — Telephone Encounter (Signed)
Wife notified.

## 2017-09-24 NOTE — Telephone Encounter (Signed)
rx'es provided for this month and for July.

## 2017-09-24 NOTE — Telephone Encounter (Signed)
Requesting refill for Concerta, last prescribed 07/25/17. Next appt 11/21/17.

## 2017-10-31 IMAGING — CR DG SHOULDER 2+V*R*
1 series · 4 of 4 positions shown · non-contrast
Comparison: None

CLINICAL DATA: RIGHT shoulder pain radiating down RIGHT arm,
painful to move for axillary view, pain for few weeks, no known
injury

EXAM:
RIGHT SHOULDER - 2+ VIEW

[Series 1: dg shoulder right · 0.14mm/px · 4 of 4 slices shown]
[im 1/4]
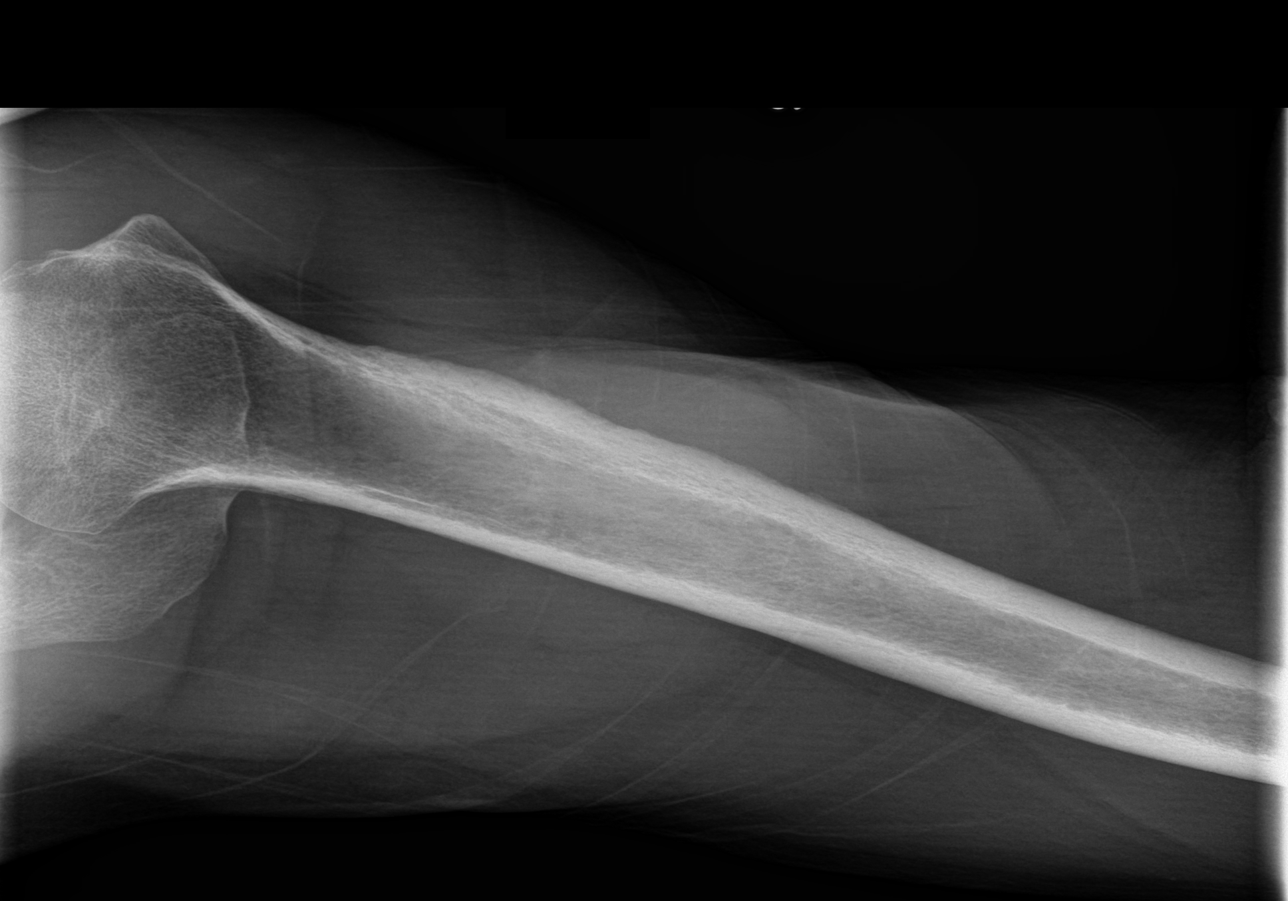
[im 2/4]
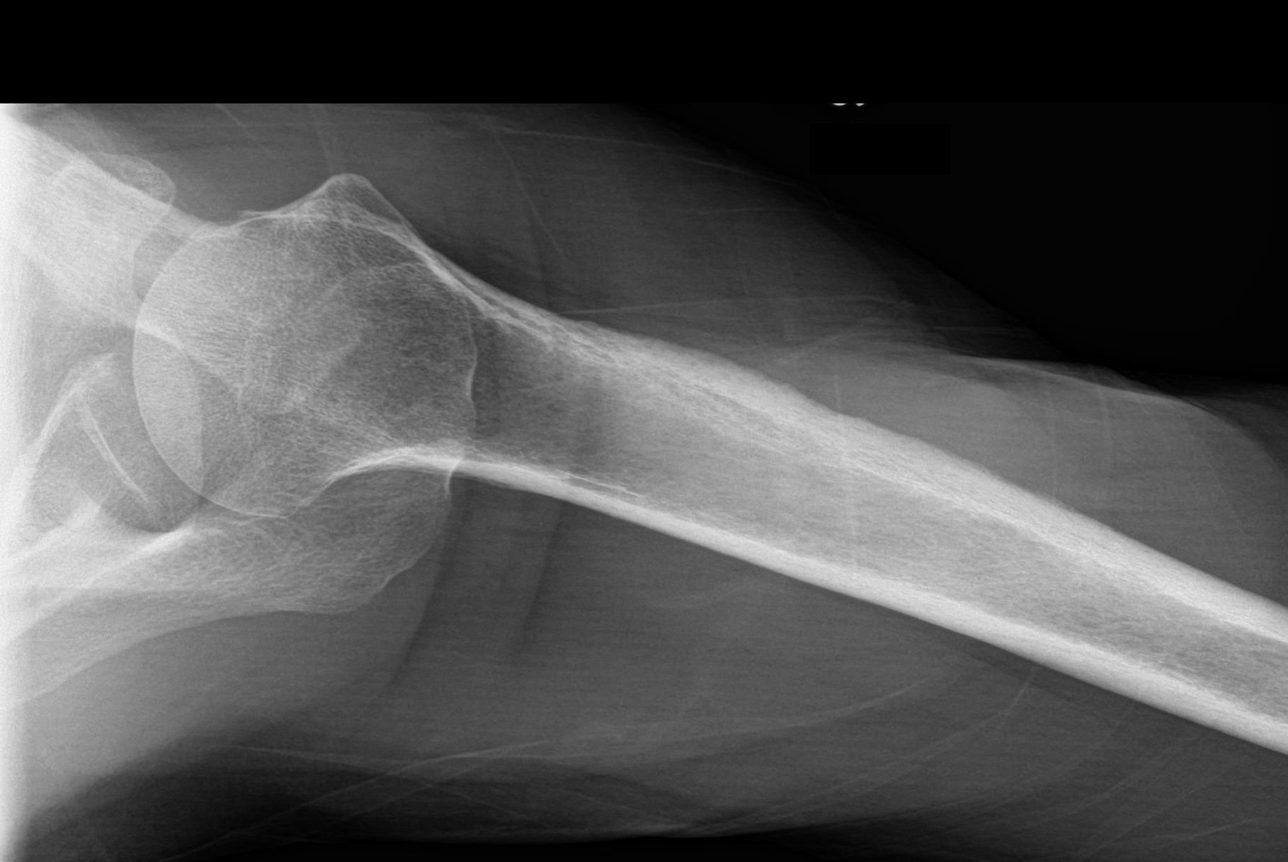
[im 3/4]
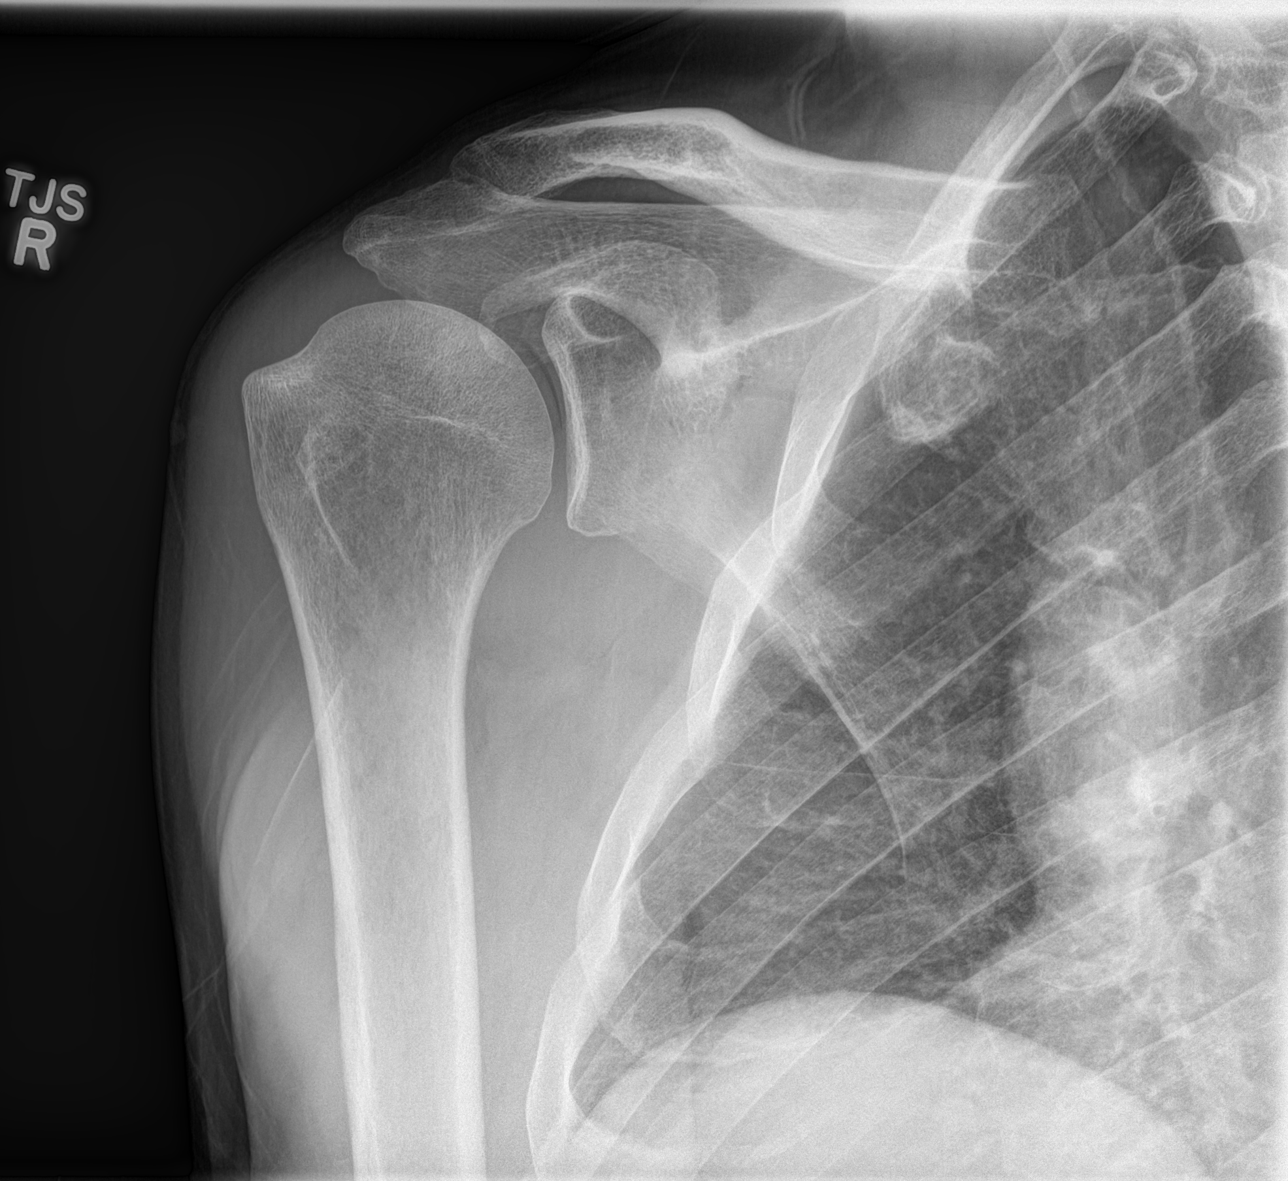
[im 4/4]
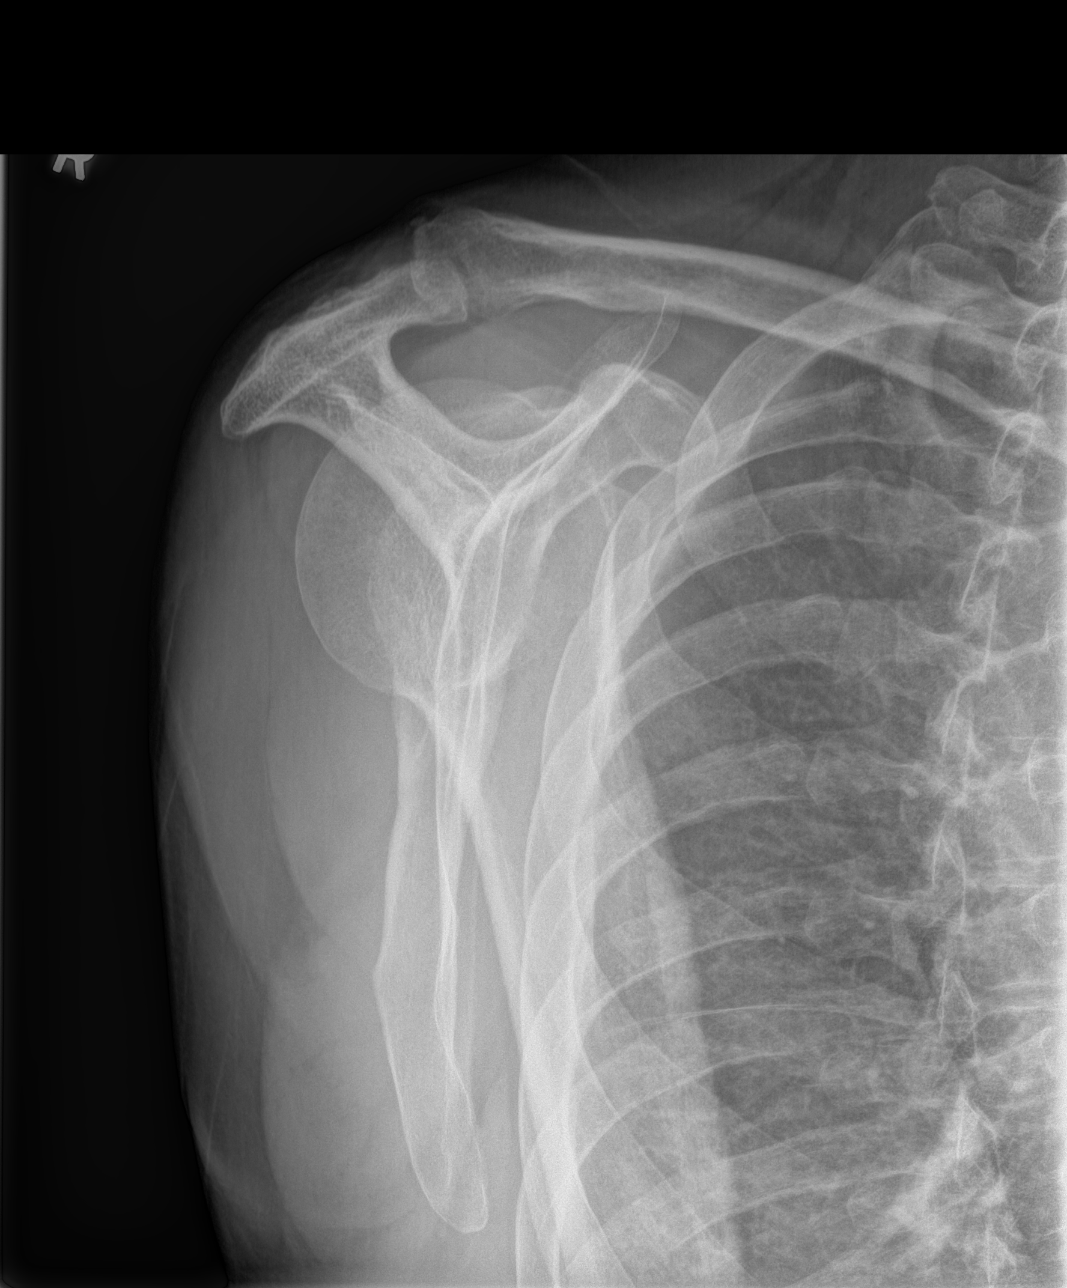

[4 of 4 positions shown; findings below may reference images not displayed]

FINDINGS: Osseous mineralization appears low normal for technique.

AC joint alignment normal.

No acute fracture, dislocation, or bone destruction.

Visualized RIGHT ribs intact.
IMPRESSION: No acute osseous abnormalities.

## 2017-11-14 ENCOUNTER — Ambulatory Visit: Payer: Self-pay | Admitting: Family Medicine

## 2017-11-19 ENCOUNTER — Ambulatory Visit (INDEPENDENT_AMBULATORY_CARE_PROVIDER_SITE_OTHER): Payer: Self-pay | Admitting: Family Medicine

## 2017-11-19 ENCOUNTER — Encounter: Payer: Self-pay | Admitting: Family Medicine

## 2017-11-19 VITALS — BP 122/80 | HR 56 | Temp 97.7°F | Wt 196.0 lb

## 2017-11-19 DIAGNOSIS — I10 Essential (primary) hypertension: Secondary | ICD-10-CM

## 2017-11-19 DIAGNOSIS — F5101 Primary insomnia: Secondary | ICD-10-CM

## 2017-11-19 DIAGNOSIS — S069X3S Unspecified intracranial injury with loss of consciousness of 1 hour to 5 hours 59 minutes, sequela: Secondary | ICD-10-CM

## 2017-11-19 NOTE — Progress Notes (Signed)
Patient: Justin Fox Male    DOB: 07/27/1960   57 y.o.   MRN: [id number] Visit Date: 11/19/2017  Today's Provider: [name], MD   Chief Complaint  Patient presents with  . Hypertension  . Insomnia   Subjective:    Hypertension  This is a chronic problem. The problem is unchanged. The problem is controlled. Pertinent negatives include no anxiety, blurred vision, chest pain, headaches, malaise/fatigue, neck pain, orthopnea, palpitations, peripheral edema, PND, shortness of breath or sweats. There are no associated agents to hypertension. There are no known risk factors for coronary artery disease. There are no compliance problems.   Insomnia  Primary symptoms: sleep disturbance, no malaise/fatigue.   He is still followed by rehab medicine for traumatic brain injury.  He has made great progress.  He is now been sober from alcohol and drugs for more than 10 years.     No Known Allergies   Current Outpatient Medications:  .  ascorbic acid (VITAMIN C) 100 MG tablet, Take 100 mg by mouth daily. Reported on 08/11/2015, Disp: , Rfl:  .  B Complex-C (SUPER B COMPLEX PO), Take 1 tablet by mouth daily., Disp: , Rfl:  .  carvedilol (COREG) 12.5 MG tablet, TAKE ONE TABLET BY MOUTH TWICE DAILY WITH A MEAL, Disp: 180 tablet, Rfl: 3 .  Garlic 8250 MG CAPS, Take 1 capsule by mouth daily. , Disp: , Rfl:  .  LamoTRIgine (LAMICTAL XR) 50 MG TB24 24 hour tablet, Take 50 mg by mouth daily., Disp: , Rfl:  .  Methylcellulose, Laxative, (CITRUCEL) 500 MG TABS, Take 1 tablet by mouth every morning., Disp: , Rfl:  .  methylphenidate (CONCERTA) 36 MG PO CR tablet, Take 1 tablet (36 mg total) by mouth daily., Disp: 30 tablet, Rfl: 0 .  Misc Natural Products (GLUCOSAMINE CHONDROITIN TRIPLE) TABS, Take 2 tablets by mouth daily., Disp: , Rfl:  .  Multiple Vitamin tablet, Take 1 tablet by mouth daily. , Disp: , Rfl:  .  Omega-3 Fatty Acids (FISH OIL ULTRA) 1400 MG CAPS, Take 1 capsule by  mouth daily., Disp: , Rfl:  .  QUEtiapine (SEROQUEL) 100 MG tablet, Take 1 tablet by mouth at bedtime, Disp: 30 tablet, Rfl: 5 .  vitamin E 400 UNIT capsule, Take 400 Units by mouth daily., Disp: , Rfl:   Review of Systems  Constitutional: Negative.  Negative for malaise/fatigue.  HENT: Negative.   Eyes: Negative.  Negative for blurred vision.  Respiratory: Negative.  Negative for shortness of breath.   Cardiovascular: Negative.  Negative for chest pain, palpitations, orthopnea and PND.  Gastrointestinal: Negative.   Endocrine: Negative.   Musculoskeletal: Negative.  Negative for neck pain.  Neurological: Negative for dizziness, light-headedness and headaches.  Psychiatric/Behavioral: Positive for sleep disturbance. The patient has insomnia.     Social History   Tobacco Use  . Smoking status: Former Smoker    Packs/day: 1.50    Years: 35.00    Pack years: 52.50    Types: Cigarettes    Last attempt to quit: 04/03/2008    Years since quitting: 9.6  . Smokeless tobacco: Never Used  . Tobacco comment: Has been quit for 7-8 years  Substance Use Topics  . Alcohol use: No    Comment: Has had issues with this in the past. Has not drank in 7-8 years.   Objective:   BP 122/80 (BP Location: Left Arm, Patient Position: Sitting, Cuff Size: Normal)   Pulse Marland Kitchen)  56   Temp 97.7 F (36.5 C) (Oral)   Wt 196 lb (88.9 kg)   SpO2 97%   BMI 25.86 kg/m  Vitals:   11/19/17 0915  BP: 122/80  Pulse: (!) 56  Temp: 97.7 F (36.5 C)  TempSrc: Oral  SpO2: 97%  Weight: 196 lb (88.9 kg)     Physical Exam  Constitutional: He is oriented to person, place, and time. He appears well-developed and well-nourished.  HENT:  Right Ear: External ear normal.  Left Ear: External ear normal.  Nose: Nose normal.  Eyes: Conjunctivae are normal. No scleral icterus.  Neck: No thyromegaly present.  Cardiovascular: Normal rate, regular rhythm and normal heart sounds.  Pulmonary/Chest: Effort normal and  breath sounds normal.  Abdominal: Soft.  Musculoskeletal: He exhibits no edema.  Neurological: He is alert and oriented to person, place, and time.  Skin: Skin is warm and dry.  Psychiatric: He has a normal mood and affect. His behavior is normal. Judgment and thought content normal.        Assessment & Plan:     1. Benign essential HTN   2. 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 3. Primary insomnia 4.Alcoholism Pt sober for many years now.      I have done the exam and reviewed the above chart and it is accurate to the best of my knowledge. Development worker, community has been used in this note in any air is in the dictation or transcription are unintentional.  [name], MD  [place]

## 2017-11-21 ENCOUNTER — Encounter: Payer: Self-pay | Attending: Physical Medicine & Rehabilitation | Admitting: Physical Medicine & Rehabilitation

## 2017-11-21 ENCOUNTER — Other Ambulatory Visit: Payer: Self-pay

## 2017-11-21 ENCOUNTER — Encounter: Payer: Self-pay | Admitting: Physical Medicine & Rehabilitation

## 2017-11-21 VITALS — BP 122/76 | HR 66 | Ht 73.0 in | Wt 197.2 lb

## 2017-11-21 DIAGNOSIS — S069X0S Unspecified intracranial injury without loss of consciousness, sequela: Secondary | ICD-10-CM

## 2017-11-21 DIAGNOSIS — S069X0A Unspecified intracranial injury without loss of consciousness, initial encounter: Secondary | ICD-10-CM | POA: Insufficient documentation

## 2017-11-21 DIAGNOSIS — Z87891 Personal history of nicotine dependence: Secondary | ICD-10-CM | POA: Insufficient documentation

## 2017-11-21 DIAGNOSIS — I10 Essential (primary) hypertension: Secondary | ICD-10-CM | POA: Insufficient documentation

## 2017-11-21 DIAGNOSIS — W12XXXS Fall on and from scaffolding, sequela: Secondary | ICD-10-CM | POA: Insufficient documentation

## 2017-11-21 DIAGNOSIS — F0789 Other personality and behavioral disorders due to known physiological condition: Secondary | ICD-10-CM

## 2017-11-21 DIAGNOSIS — Z5181 Encounter for therapeutic drug level monitoring: Secondary | ICD-10-CM

## 2017-11-21 DIAGNOSIS — S52514S Nondisplaced fracture of right radial styloid process, sequela: Secondary | ICD-10-CM | POA: Insufficient documentation

## 2017-11-21 DIAGNOSIS — G894 Chronic pain syndrome: Secondary | ICD-10-CM

## 2017-11-21 DIAGNOSIS — Z79891 Long term (current) use of opiate analgesic: Secondary | ICD-10-CM

## 2017-11-21 DIAGNOSIS — G9389 Other specified disorders of brain: Secondary | ICD-10-CM

## 2017-11-21 DIAGNOSIS — G939 Disorder of brain, unspecified: Secondary | ICD-10-CM

## 2017-11-21 DIAGNOSIS — F068 Other specified mental disorders due to known physiological condition: Secondary | ICD-10-CM

## 2017-11-21 DIAGNOSIS — S069X3S Unspecified intracranial injury with loss of consciousness of 1 hour to 5 hours 59 minutes, sequela: Secondary | ICD-10-CM

## 2017-11-21 MED ORDER — METHYLPHENIDATE HCL ER (OSM) 36 MG PO TBCR: 36.0000 mg | EXTENDED_RELEASE_TABLET | Freq: Every day | ORAL | 0 refills | Status: DC

## 2017-11-21 NOTE — Progress Notes (Signed)
Subjective:    Patient ID: Justin Fox, male    DOB: 1961-02-21, 57 y.o.   MRN: [id number]  HPI   Justin Fox is here in follow up of his TBI. He came back from [place] recently and had a good time. He did some driving and things went well. He reports some ongoing issues with concentration/focus. He lost his hearing aids recently which has been difficult. Left shoulder can be tender at times but he still works on his regular stretching program. Right face has been sensitive, and he's had some sinus drainage too.   Emotionally he has been good. He feels that he's coping well with the changes which have happened to him. He is using aids at home to assist with his memory including a calendar, phone, etc.  His wife helps as well, but Justin Fox initiates use often on his own. .   Pain Inventory Average Pain 0 Pain Right Now 0 My pain is no pain  In the last 24 hours, has pain interfered with the following? General activity 0 Relation with others 0 Enjoyment of life 0 What TIME of day is your pain at its worst? no pain Sleep (in general) Fair  Pain is worse with: no pain Pain improves with: no pain Relief from Meds: no pain  Mobility walk without assistance  Function disabled: date disabled n/a  Neuro/Psych No problems in this area  Prior Studies Any changes since last visit?  no  Physicians involved in your care Any changes since last visit?  no   Family History  Problem Relation Age of Onset  . Anxiety disorder Mother   . Thyroid disease Mother    Social History   Socioeconomic History  . Marital status: Married    Spouse name: Not on file  . Number of children: Not on file  . Years of education: Not on file  . Highest education level: Not on file  Occupational History  . Not on file  Social Needs  . Financial resource strain: Not on file  . Food insecurity:    Worry: Not on file    Inability: Not on file  . Transportation needs:    Medical: Not on file   Non-medical: Not on file  Tobacco Use  . Smoking status: Former Smoker    Packs/day: 1.50    Years: 35.00    Pack years: 52.50    Types: Cigarettes    Last attempt to quit: 04/03/2008    Years since quitting: 9.6  . Smokeless tobacco: Never Used  . Tobacco comment: Has been quit for 7-8 years  Substance and Sexual Activity  . Alcohol use: No    Comment: Has had issues with this in the past. Has not drank in 7-8 years.  . Drug use: No  . Sexual activity: Never  Lifestyle  . Physical activity:    Days per week: Not on file    Minutes per session: Not on file  . Stress: Not on file  Relationships  . Social connections:    Talks on phone: Not on file    Gets together: Not on file    Attends religious service: Not on file    Active member of club or organization: Not on file    Attends meetings of clubs or organizations: Not on file    Relationship status: Not on file  Other Topics Concern  . Not on file  Social History Narrative  . Not on file   Past Surgical  History:  Procedure Laterality Date  . COLONOSCOPY WITH PROPOFOL N/A 10/12/2015   Procedure: COLONOSCOPY WITH PROPOFOL;  Surgeon: Lucilla Lame, MD;  Location: [facility] ENDOSCOPY;  Service: Endoscopy;  Laterality: N/A;  . NO PAST SURGERIES     Past Medical History:  Diagnosis Date  . Hypertension    BP 122/76   Pulse 66   Ht 6\' 1"  (1.854 m)   Wt 197 lb 3.2 oz (89.4 kg)   SpO2 97%   BMI 26.02 kg/m   Opioid Risk Score:   Fall Risk Score:  `1  Depression screen PHQ 2/9  Depression screen [facility] 2/9 11/21/2017 07/25/2017 05/16/2017 05/24/2016 02/11/2016 08/11/2015  Decreased Interest 0 0 0 0 0 0  Down, Depressed, Hopeless 0 0 0 0 0 0  PHQ - 2 Score 0 0 0 0 0 0  Altered sleeping - - - - 0 -  Tired, decreased energy - - - - 0 -  Change in appetite - - - - 0 -  Feeling bad or failure about yourself  - - - - 0 -  Trouble concentrating - - - - 0 -  Moving slowly or fidgety/restless - - - - 0 -  Suicidal thoughts - - - - 0 -    PHQ-9 Score - - - - 0 -    Review of Systems  Constitutional: Negative.   HENT: Negative.   Eyes: Negative.   Respiratory: Negative.   Cardiovascular: Negative.   Gastrointestinal: Negative.   Endocrine: Negative.   Genitourinary: Negative.   Musculoskeletal: Negative.   Skin: Negative.   Allergic/Immunologic: Negative.   Neurological: Negative.   Hematological: Negative.   Psychiatric/Behavioral: Negative.   All other systems reviewed and are negative.      Objective:   Physical Exam  General: No acute distress HEENT: EOMI, oral membranes moist Cards: reg rate  Chest: normal effort Abdomen: Soft, NT, ND Skin: dry, intact Extremities: no edema  Neurological: He is alert and appropriate. Motor:5.5 All 4's Sens: normal Musc:right shoulder moving freely.  Left shoulder with mild pain over long head biceps tendon, near normal active range of motion.   Gait: stable.  Skin: Warm and dry. Intact.  Psych: pleasant, sl impulsive Cognitive:STM deficits, distracted, improved insight   Assessment & Plan:    1. 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  -continue HEP.   -driving locally  2. psch: -can continue lamictal for mood control. He is showing better social awareness in general -concerta 36mg  daily. 2nd rf for next month. We will continue the controlled substance monitoring program, this consists of regular clinic visits, examinations, routine drug screening, pill counts as well as use of [place] Controlled Substance Reporting System. [place].         -UDS today -melatonin ok for sleep -continue seroquel At 100mg  qhs.   -Sleeping and sleep hygiene has improved. needs to go to bed a little later 3. HTN perPCP  4. Nondisplaced right radial styloid fracture.  -follow up per  ortho, healed  5. Right rotator cuff syndrome/adhesive capsulitis:  -naproxen---may use prn   6minutes of face to face patient care time were spent during this visit. All questions were encouraged and answered. Follow up with me in about6 months.

## 2017-11-27 LAB — TOXASSURE SELECT,+ANTIDEPR,UR

## 2017-11-28 ENCOUNTER — Telehealth: Payer: Self-pay | Admitting: *Deleted

## 2017-11-28 NOTE — Telephone Encounter (Signed)
Urine drug screen for this encounter is consistent for prescribed medication 

## 2018-01-21 ENCOUNTER — Other Ambulatory Visit: Payer: Self-pay

## 2018-01-21 DIAGNOSIS — S069X0S Unspecified intracranial injury without loss of consciousness, sequela: Secondary | ICD-10-CM

## 2018-01-21 DIAGNOSIS — G9389 Other specified disorders of brain: Secondary | ICD-10-CM

## 2018-01-21 DIAGNOSIS — F068 Other specified mental disorders due to known physiological condition: Secondary | ICD-10-CM

## 2018-01-21 DIAGNOSIS — G939 Disorder of brain, unspecified: Secondary | ICD-10-CM

## 2018-01-21 DIAGNOSIS — S069X3S Unspecified intracranial injury with loss of consciousness of 1 hour to 5 hours 59 minutes, sequela: Secondary | ICD-10-CM

## 2018-01-21 DIAGNOSIS — F0789 Other personality and behavioral disorders due to known physiological condition: Secondary | ICD-10-CM

## 2018-01-21 NOTE — Telephone Encounter (Signed)
Pt wife called requesting refill for Concerta

## 2018-01-22 MED ORDER — METHYLPHENIDATE HCL ER (OSM) 36 MG PO TBCR: 36.0000 mg | EXTENDED_RELEASE_TABLET | Freq: Every day | ORAL | 0 refills | Status: DC

## 2018-01-22 NOTE — Telephone Encounter (Signed)
Ok done

## 2018-01-22 NOTE — Telephone Encounter (Signed)
Refill request for Concerta, last filled 9/73/31, next appt 05/22/2018. [facility] [place].

## 2018-03-22 ENCOUNTER — Telehealth: Payer: Self-pay | Admitting: *Deleted

## 2018-03-22 DIAGNOSIS — G9389 Other specified disorders of brain: Secondary | ICD-10-CM

## 2018-03-22 DIAGNOSIS — F0789 Other personality and behavioral disorders due to known physiological condition: Secondary | ICD-10-CM

## 2018-03-22 DIAGNOSIS — S069X3S Unspecified intracranial injury with loss of consciousness of 1 hour to 5 hours 59 minutes, sequela: Secondary | ICD-10-CM

## 2018-03-22 DIAGNOSIS — S069X0S Unspecified intracranial injury without loss of consciousness, sequela: Secondary | ICD-10-CM

## 2018-03-22 DIAGNOSIS — G939 Disorder of brain, unspecified: Secondary | ICD-10-CM

## 2018-03-22 DIAGNOSIS — F068 Other specified mental disorders due to known physiological condition: Secondary | ICD-10-CM

## 2018-03-22 MED ORDER — METHYLPHENIDATE HCL ER (OSM) 36 MG PO TBCR: 36.0000 mg | EXTENDED_RELEASE_TABLET | Freq: Every day | ORAL | 0 refills | Status: DC

## 2018-03-22 NOTE — Telephone Encounter (Signed)
Dr. Naaman Plummer note was reviewed, PMP was reviewed last Concerta was filled on 67/59/16. Concerta prescription sent for December and January. [name] has a scheduled appointment with Dr. Naaman Plummer on 05/22/18.Placed a call to [name] she verbalizes understanding.

## 2018-03-22 NOTE — Telephone Encounter (Signed)
Patients wife called at 2:45PM on a Friday to inform that the patient needs his refills on Concerta. Forwarded to Danella Sensing, ANP

## 2018-04-29 ENCOUNTER — Other Ambulatory Visit: Payer: Self-pay | Admitting: Family Medicine

## 2018-05-20 ENCOUNTER — Encounter: Payer: Self-pay | Admitting: Family Medicine

## 2018-05-22 ENCOUNTER — Encounter: Payer: Self-pay | Admitting: Physical Medicine & Rehabilitation

## 2018-05-22 ENCOUNTER — Encounter: Payer: 59 | Attending: Physical Medicine & Rehabilitation | Admitting: Physical Medicine & Rehabilitation

## 2018-05-22 DIAGNOSIS — I10 Essential (primary) hypertension: Secondary | ICD-10-CM | POA: Diagnosis not present

## 2018-05-22 DIAGNOSIS — Z87891 Personal history of nicotine dependence: Secondary | ICD-10-CM | POA: Diagnosis not present

## 2018-05-22 DIAGNOSIS — F0789 Other personality and behavioral disorders due to known physiological condition: Secondary | ICD-10-CM | POA: Diagnosis not present

## 2018-05-22 DIAGNOSIS — Z87820 Personal history of traumatic brain injury: Secondary | ICD-10-CM | POA: Insufficient documentation

## 2018-05-22 DIAGNOSIS — Z79899 Other long term (current) drug therapy: Secondary | ICD-10-CM | POA: Diagnosis not present

## 2018-05-22 DIAGNOSIS — G9389 Other specified disorders of brain: Secondary | ICD-10-CM | POA: Diagnosis not present

## 2018-05-22 DIAGNOSIS — S069X3S Unspecified intracranial injury with loss of consciousness of 1 hour to 5 hours 59 minutes, sequela: Secondary | ICD-10-CM

## 2018-05-22 DIAGNOSIS — S069X0S Unspecified intracranial injury without loss of consciousness, sequela: Secondary | ICD-10-CM

## 2018-05-22 DIAGNOSIS — M75101 Unspecified rotator cuff tear or rupture of right shoulder, not specified as traumatic: Secondary | ICD-10-CM | POA: Insufficient documentation

## 2018-05-22 DIAGNOSIS — M7501 Adhesive capsulitis of right shoulder: Secondary | ICD-10-CM | POA: Diagnosis not present

## 2018-05-22 DIAGNOSIS — W12XXXA Fall on and from scaffolding, initial encounter: Secondary | ICD-10-CM | POA: Diagnosis not present

## 2018-05-22 DIAGNOSIS — S0292XA Unspecified fracture of facial bones, initial encounter for closed fracture: Secondary | ICD-10-CM | POA: Diagnosis not present

## 2018-05-22 DIAGNOSIS — F079 Unspecified personality and behavioral disorder due to known physiological condition: Secondary | ICD-10-CM

## 2018-05-22 DIAGNOSIS — S065X0A Traumatic subdural hemorrhage without loss of consciousness, initial encounter: Secondary | ICD-10-CM | POA: Insufficient documentation

## 2018-05-22 DIAGNOSIS — F068 Other specified mental disorders due to known physiological condition: Secondary | ICD-10-CM

## 2018-05-22 MED ORDER — METHYLPHENIDATE HCL ER (OSM) 36 MG PO TBCR: 36.0000 mg | EXTENDED_RELEASE_TABLET | Freq: Every day | ORAL | 0 refills | Status: DC
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
## 2018-05-22 NOTE — Patient Instructions (Addendum)
PLEASE FEEL FREE TO CALL OUR OFFICE WITH ANY PROBLEMS OR QUESTIONS ([phone])   CONTINUE WORKING ON CREATING AND MAINTAINING A SCHEDULE AND ROUTINE EACH AND EVERY DAY.

## 2018-05-27 ENCOUNTER — Ambulatory Visit: Payer: Self-pay | Admitting: Physical Medicine & Rehabilitation

## 2018-05-28 ENCOUNTER — Encounter: Payer: Self-pay | Admitting: Family Medicine

## 2018-05-28 ENCOUNTER — Ambulatory Visit (INDEPENDENT_AMBULATORY_CARE_PROVIDER_SITE_OTHER): Payer: Self-pay | Admitting: Family Medicine

## 2018-05-28 VITALS — BP 132/76 | HR 58 | Temp 98.0°F | Resp 16 | Ht 73.0 in | Wt 205.0 lb

## 2018-05-28 DIAGNOSIS — L570 Actinic keratosis: Secondary | ICD-10-CM

## 2018-05-28 DIAGNOSIS — Z125 Encounter for screening for malignant neoplasm of prostate: Secondary | ICD-10-CM

## 2018-05-28 DIAGNOSIS — I10 Essential (primary) hypertension: Secondary | ICD-10-CM

## 2018-05-28 DIAGNOSIS — S069X3S Unspecified intracranial injury with loss of consciousness of 1 hour to 5 hours 59 minutes, sequela: Secondary | ICD-10-CM

## 2018-05-28 DIAGNOSIS — Z0000 Encounter for general adult medical examination without abnormal findings: Secondary | ICD-10-CM

## 2018-05-28 LAB — POCT URINALYSIS DIPSTICK
Bilirubin, UA: NEGATIVE
Blood, UA: NEGATIVE
Glucose, UA: NEGATIVE
Ketones, UA: NEGATIVE
LEUKOCYTES UA: NEGATIVE
Nitrite, UA: NEGATIVE
PROTEIN UA: NEGATIVE
Spec Grav, UA: 1.02 (ref 1.010–1.025)
Urobilinogen, UA: 0.2 E.U./dL
pH, UA: 7 (ref 5.0–8.0)
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
## 2018-05-28 NOTE — Patient Instructions (Signed)
Check with insurance about getting Shingles vaccine.

## 2018-05-30 LAB — COMPREHENSIVE METABOLIC PANEL
ALT: 17 IU/L (ref 0–44)
AST: 14 IU/L (ref 0–40)
Albumin/Globulin Ratio: 2.3 — ABNORMAL HIGH (ref 1.2–2.2)
Albumin: 4.8 g/dL (ref 3.8–4.9)
Alkaline Phosphatase: 57 IU/L (ref 39–117)
BUN/Creatinine Ratio: 11 (ref 9–20)
BUN: 12 mg/dL (ref 6–24)
Bilirubin Total: 0.5 mg/dL (ref 0.0–1.2)
CHLORIDE: 102 mmol/L (ref 96–106)
CO2: 25 mmol/L (ref 20–29)
Calcium: 9.6 mg/dL (ref 8.7–10.2)
Creatinine, Ser: 1.12 mg/dL (ref 0.76–1.27)
GFR calc Af Amer: 83 mL/min/{1.73_m2} (ref >59)
GFR calc non Af Amer: 72 mL/min/{1.73_m2} (ref >59)
GLOBULIN, TOTAL: 2.1 g/dL (ref 1.5–4.5)
Glucose: 92 mg/dL (ref 65–99)
POTASSIUM: 4.4 mmol/L (ref 3.5–5.2)
SODIUM: 140 mmol/L (ref 134–144)
Total Protein: 6.9 g/dL (ref 6.0–8.5)

## 2018-05-30 LAB — CBC WITH DIFFERENTIAL/PLATELET
Basophils Absolute: 0.1 10*3/uL (ref 0.0–0.2)
Basos: 1 %
EOS (ABSOLUTE): 0.4 10*3/uL (ref 0.0–0.4)
Eos: 6 %
Hematocrit: 41.9 % (ref 37.5–51.0)
Hemoglobin: 14.2 g/dL (ref 13.0–17.7)
Immature Grans (Abs): 0 10*3/uL (ref 0.0–0.1)
Immature Granulocytes: 1 %
Lymphocytes Absolute: 2.4 10*3/uL (ref 0.7–3.1)
Lymphs: 39 %
MCH: 30.2 pg (ref 26.6–33.0)
MCHC: 33.9 g/dL (ref 31.5–35.7)
MCV: 89 fL (ref 79–97)
Monocytes Absolute: 0.5 10*3/uL (ref 0.1–0.9)
Monocytes: 7 %
Neutrophils Absolute: 2.8 10*3/uL (ref 1.4–7.0)
Neutrophils: 46 %
Platelets: 294 10*3/uL (ref 150–450)
RBC: 4.7 x10E6/uL (ref 4.14–5.80)
RDW: 11.8 % (ref 11.6–15.4)
WBC: 6.2 10*3/uL (ref 3.4–10.8)

## 2018-05-30 LAB — LAMOTRIGINE LEVEL: Lamotrigine Lvl: 2.2 ug/mL (ref 2.0–20.0)

## 2018-05-30 LAB — PSA: Prostate Specific Ag, Serum: 0.8 ng/mL (ref 0.0–4.0)

## 2018-07-15 ENCOUNTER — Telehealth: Payer: Self-pay

## 2018-07-15 DIAGNOSIS — S069X3S Unspecified intracranial injury with loss of consciousness of 1 hour to 5 hours 59 minutes, sequela: Secondary | ICD-10-CM

## 2018-07-15 DIAGNOSIS — F068 Other specified mental disorders due to known physiological condition: Secondary | ICD-10-CM

## 2018-07-15 DIAGNOSIS — G9389 Other specified disorders of brain: Secondary | ICD-10-CM

## 2018-07-15 DIAGNOSIS — G939 Disorder of brain, unspecified: Secondary | ICD-10-CM

## 2018-07-15 DIAGNOSIS — S069X0S Unspecified intracranial injury without loss of consciousness, sequela: Secondary | ICD-10-CM

## 2018-07-15 DIAGNOSIS — F0789 Other personality and behavioral disorders due to known physiological condition: Secondary | ICD-10-CM

## 2018-07-15 MED ORDER — METHYLPHENIDATE HCL ER (OSM) 36 MG PO TBCR: 36.0000 mg | EXTENDED_RELEASE_TABLET | Freq: Every day | ORAL | 0 refills | Status: DC

## 2018-07-15 NOTE — Telephone Encounter (Signed)
Noelle called for patient refill request for Concerta last filled #[phone] 06/23/2018, next appt 11/20/2018

## 2018-07-15 NOTE — Telephone Encounter (Signed)
meds RF'ed for 2 months

## 2018-09-18 ENCOUNTER — Telehealth: Payer: Self-pay

## 2018-09-18 DIAGNOSIS — S069X3S Unspecified intracranial injury with loss of consciousness of 1 hour to 5 hours 59 minutes, sequela: Secondary | ICD-10-CM

## 2018-09-18 DIAGNOSIS — F0789 Other personality and behavioral disorders due to known physiological condition: Secondary | ICD-10-CM

## 2018-09-18 DIAGNOSIS — G939 Disorder of brain, unspecified: Secondary | ICD-10-CM

## 2018-09-18 DIAGNOSIS — F068 Other specified mental disorders due to known physiological condition: Secondary | ICD-10-CM

## 2018-09-18 DIAGNOSIS — R4189 Other symptoms and signs involving cognitive functions and awareness: Secondary | ICD-10-CM

## 2018-09-18 MED ORDER — METHYLPHENIDATE HCL ER (OSM) 36 MG PO TBCR: 36.0000 mg | EXTENDED_RELEASE_TABLET | Freq: Every day | ORAL | 0 refills | Status: DC

## 2018-09-18 NOTE — Telephone Encounter (Signed)
Patients wife called and left message requesting a refill of concerta for this patient.  Last prescriptions was in mid-may.  Next appointment for patient is in August for patient.

## 2018-09-18 NOTE — Telephone Encounter (Signed)
RF's sent to pharmacy

## 2018-10-14 ENCOUNTER — Telehealth: Payer: Self-pay | Admitting: *Deleted

## 2018-10-14 NOTE — Telephone Encounter (Signed)
(  late entry) Prior auth for methylphenidate ER 36 mg #03 (Concerta) submitted to [facility].  Approval received 05/27/18 through 05/28/2019

## 2018-11-20 ENCOUNTER — Encounter: Payer: 59 | Admitting: Physical Medicine & Rehabilitation

## 2018-11-20 ENCOUNTER — Telehealth: Payer: Self-pay | Admitting: Physical Medicine & Rehabilitation

## 2018-11-20 DIAGNOSIS — F0789 Other personality and behavioral disorders due to known physiological condition: Secondary | ICD-10-CM

## 2018-11-20 DIAGNOSIS — F068 Other specified mental disorders due to known physiological condition: Secondary | ICD-10-CM

## 2018-11-20 DIAGNOSIS — G939 Disorder of brain, unspecified: Secondary | ICD-10-CM

## 2018-11-20 DIAGNOSIS — S069X3S Unspecified intracranial injury with loss of consciousness of 1 hour to 5 hours 59 minutes, sequela: Secondary | ICD-10-CM

## 2018-11-20 DIAGNOSIS — S069X0S Unspecified intracranial injury without loss of consciousness, sequela: Secondary | ICD-10-CM

## 2018-11-20 MED ORDER — METHYLPHENIDATE HCL ER (OSM) 36 MG PO TBCR: 36.0000 mg | EXTENDED_RELEASE_TABLET | Freq: Every day | ORAL | 0 refills | Status: DC

## 2018-11-20 NOTE — Telephone Encounter (Signed)
Last filled #30 10/21/2018,appt changed to 12/25/2018

## 2018-11-20 NOTE — Telephone Encounter (Signed)
Our office had to switch patient's appointment to September, however, patient will be out of his Concerta medication.  Can someone call wife to let her know this can be refilled?

## 2018-11-20 NOTE — Telephone Encounter (Signed)
RF sent in  

## 2018-11-25 ENCOUNTER — Ambulatory Visit: Payer: Self-pay | Admitting: Family Medicine

## 2018-11-26 ENCOUNTER — Other Ambulatory Visit: Payer: Self-pay

## 2018-11-26 ENCOUNTER — Ambulatory Visit (INDEPENDENT_AMBULATORY_CARE_PROVIDER_SITE_OTHER): Payer: 59 | Admitting: Family Medicine

## 2018-11-26 ENCOUNTER — Encounter: Payer: Self-pay | Admitting: Family Medicine

## 2018-11-26 VITALS — BP 120/78 | HR 56 | Temp 98.2°F | Resp 16 | Wt 202.0 lb

## 2018-11-26 DIAGNOSIS — S069X3S Unspecified intracranial injury with loss of consciousness of 1 hour to 5 hours 59 minutes, sequela: Secondary | ICD-10-CM | POA: Diagnosis not present

## 2018-11-26 DIAGNOSIS — I10 Essential (primary) hypertension: Secondary | ICD-10-CM

## 2018-11-26 DIAGNOSIS — L821 Other seborrheic keratosis: Secondary | ICD-10-CM

## 2018-11-26 DIAGNOSIS — Z23 Encounter for immunization: Secondary | ICD-10-CM

## 2018-11-26 NOTE — Progress Notes (Signed)
Patient: Justin Fox Male    DOB: 11-29-1960   58 y.o.   MRN: [id number] Visit Date: 11/26/2018  Today's Provider: [name], MD   Chief Complaint  Patient presents with  . Hypertension   Subjective:    HPI  Hypertension, follow-up:  BP Readings from Last 3 Encounters:  11/26/18 120/78  05/28/18 132/76  05/22/18 123/81    He was last seen for hypertension 6 months ago.  BP at that visit was 132/76. Management since that visit includes no changes. He reports good compliance with treatment. He is not having side effects.  He is not exercising. He is adherent to low salt diet.   Outside blood pressures are checked occasionally. He is experiencing none.  Patient denies exertional chest pressure/discomfort, lower extremity edema and palpitations.     Weight trend: stable Wt Readings from Last 3 Encounters:  11/26/18 202 lb (91.6 kg)  05/28/18 205 lb (93 kg)  05/22/18 205 lb (93 kg)    Current diet: well balanced  Short term memory is still the only real issue since his TBI.His family is very supportive and he is doing well.  No Known Allergies   Current Outpatient Medications:  .  ascorbic acid (VITAMIN C) 100 MG tablet, Take 100 mg by mouth daily. Reported on 08/11/2015, Disp: , Rfl:  .  B Complex-C (SUPER B COMPLEX PO), Take 1 tablet by mouth daily., Disp: , Rfl:  .  carvedilol (COREG) 12.5 MG tablet, TAKE 1 TABLET BY MOUTH TWICE DAILY WITH MEALS, Disp: 60 tablet, Rfl: 11 .  Garlic 99991111 MG CAPS, Take 1 capsule by mouth daily. , Disp: , Rfl:  .  LamoTRIgine (LAMICTAL XR) 50 MG TB24 24 hour tablet, Take 50 mg by mouth daily., Disp: , Rfl:  .  Methylcellulose, Laxative, (CITRUCEL) 500 MG TABS, Take 1 tablet by mouth every morning., Disp: , Rfl:  .  methylphenidate (CONCERTA) 36 MG PO CR tablet, Take 1 tablet (36 mg total) by mouth daily., Disp: 30 tablet, Rfl: 0 .  Misc Natural Products (GLUCOSAMINE CHONDROITIN TRIPLE) TABS, Take 2 tablets by mouth  daily., Disp: , Rfl:  .  Multiple Vitamin tablet, Take 1 tablet by mouth daily. , Disp: , Rfl:  .  Omega-3 Fatty Acids (FISH OIL ULTRA) 1400 MG CAPS, Take 1 capsule by mouth daily., Disp: , Rfl:  .  QUEtiapine (SEROQUEL) 100 MG tablet, Take 1 tablet by mouth at bedtime, Disp: 30 tablet, Rfl: 5 .  vitamin E 400 UNIT capsule, Take 400 Units by mouth daily., Disp: , Rfl:   Review of Systems  Constitutional: Negative for activity change.  HENT: Positive for congestion.   Respiratory: Negative for cough and shortness of breath.   Cardiovascular: Negative for chest pain, palpitations and leg swelling.  Allergic/Immunologic: Positive for environmental allergies.  Neurological: Negative for dizziness.       Short term memory loss.  Psychiatric/Behavioral: Negative for agitation, self-injury and sleep disturbance. The patient is not nervous/anxious and is not hyperactive.     Social History   Tobacco Use  . Smoking status: Former Smoker    Packs/day: 1.50    Years: 35.00    Pack years: 52.50    Types: Cigarettes    Quit date: 04/03/2008    Years since quitting: 10.6  . Smokeless tobacco: Never Used  . Tobacco comment: Has been quit for 7-8 years  Substance Use Topics  . Alcohol use: No  Comment: Has had issues with this in the past. Has not drank in 7-8 years.      Objective:   BP 120/78   Pulse (!) 56   Temp 98.2 F (36.8 C)   Resp 16   Wt 202 lb (91.6 kg)   SpO2 98%   BMI 26.65 kg/m  Vitals:   11/26/18 1105  BP: 120/78  Pulse: (!) 56  Resp: 16  Temp: 98.2 F (36.8 C)  SpO2: 98%  Weight: 202 lb (91.6 kg)     Physical Exam Vitals signs reviewed.  Constitutional:      Appearance: He is well-developed.  HENT:     Right Ear: External ear normal.     Left Ear: External ear normal.     Nose: Nose normal.  Eyes:     General: No scleral icterus.    Conjunctiva/sclera: Conjunctivae normal.  Neck:     Thyroid: No thyromegaly.  Cardiovascular:     Rate and Rhythm:  Normal rate and regular rhythm.     Heart sounds: Normal heart sounds.  Pulmonary:     Effort: Pulmonary effort is normal.     Breath sounds: Normal breath sounds.  Abdominal:     Palpations: Abdomen is soft.  Skin:    General: Skin is warm and dry.     Comments: SKs.  Neurological:     Mental Status: He is alert and oriented to person, place, and time. Mental status is at baseline.  Psychiatric:        Mood and Affect: Mood normal.        Behavior: Behavior normal.        Thought Content: Thought content normal.        Judgment: Judgment normal.      No results found for any visits on 11/26/18.     Assessment & Plan    1. Need for influenza vaccination  - Flu Vaccine QUAD 36+ mos IM  2. 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 Benign essential HTN Controlled.  4. Seborrheic keratosis Refer to dermatology.     Merton Wadlow Cranford Mon, MD  [facility]

## 2018-12-20 ENCOUNTER — Telehealth: Payer: Self-pay | Admitting: *Deleted

## 2018-12-20 DIAGNOSIS — F068 Other specified mental disorders due to known physiological condition: Secondary | ICD-10-CM

## 2018-12-20 DIAGNOSIS — G939 Disorder of brain, unspecified: Secondary | ICD-10-CM

## 2018-12-20 DIAGNOSIS — F0789 Other personality and behavioral disorders due to known physiological condition: Secondary | ICD-10-CM

## 2018-12-20 DIAGNOSIS — S069X3S Unspecified intracranial injury with loss of consciousness of 1 hour to 5 hours 59 minutes, sequela: Secondary | ICD-10-CM

## 2018-12-20 DIAGNOSIS — S069X0S Unspecified intracranial injury without loss of consciousness, sequela: Secondary | ICD-10-CM

## 2018-12-20 MED ORDER — METHYLPHENIDATE HCL ER (OSM) 36 MG PO TBCR: 36.0000 mg | EXTENDED_RELEASE_TABLET | Freq: Every day | ORAL | 0 refills | Status: DC

## 2018-12-20 NOTE — Telephone Encounter (Signed)
Contacted patient and informed.  Reminded patient to keep appointment on the 23rd of September

## 2018-12-20 NOTE — Telephone Encounter (Signed)
I assume this is for concerta? I refilled this rx.  thx

## 2018-12-20 NOTE — Telephone Encounter (Signed)
Patients wife called stating that patient has an appointment on the 23rd of September with Dr. Naaman Plummer. She states patient will run out of medication on Sunday September 20th.  She is asking for a refill or a bridge to cover until the appointment. Side Note: Patient has not been in clinic since February 2020. Patients contact number is [phone]

## 2018-12-25 ENCOUNTER — Encounter: Payer: Self-pay | Admitting: Physical Medicine & Rehabilitation

## 2018-12-25 ENCOUNTER — Encounter: Payer: 59 | Attending: Physical Medicine & Rehabilitation | Admitting: Physical Medicine & Rehabilitation

## 2018-12-25 ENCOUNTER — Other Ambulatory Visit: Payer: Self-pay

## 2018-12-25 VITALS — BP 123/80 | HR 60 | Temp 97.7°F | Ht 73.0 in | Wt 205.0 lb

## 2018-12-25 DIAGNOSIS — S069X3S Unspecified intracranial injury with loss of consciousness of 1 hour to 5 hours 59 minutes, sequela: Secondary | ICD-10-CM | POA: Diagnosis not present

## 2018-12-25 DIAGNOSIS — F068 Other specified mental disorders due to known physiological condition: Secondary | ICD-10-CM | POA: Diagnosis present

## 2018-12-25 DIAGNOSIS — S069X0S Unspecified intracranial injury without loss of consciousness, sequela: Secondary | ICD-10-CM | POA: Diagnosis present

## 2018-12-25 DIAGNOSIS — G939 Disorder of brain, unspecified: Secondary | ICD-10-CM

## 2018-12-25 DIAGNOSIS — F0789 Other personality and behavioral disorders due to known physiological condition: Secondary | ICD-10-CM | POA: Diagnosis present

## 2018-12-25 DIAGNOSIS — G9389 Other specified disorders of brain: Secondary | ICD-10-CM

## 2018-12-25 DIAGNOSIS — Z5181 Encounter for therapeutic drug level monitoring: Secondary | ICD-10-CM | POA: Insufficient documentation

## 2018-12-25 MED ORDER — METHYLPHENIDATE HCL ER (OSM) 36 MG PO TBCR: 36.0000 mg | EXTENDED_RELEASE_TABLET | Freq: Every day | ORAL | 0 refills | Status: DC
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
## 2018-12-25 NOTE — Patient Instructions (Signed)
PLEASE FEEL FREE TO CALL OUR OFFICE WITH ANY PROBLEMS OR QUESTIONS ([phone])      

## 2018-12-30 LAB — TOXASSURE SELECT,+ANTIDEPR,UR

## 2019-01-01 ENCOUNTER — Telehealth: Payer: Self-pay | Admitting: *Deleted

## 2019-01-01 NOTE — Telephone Encounter (Signed)
Urine drug screen for this encounter is consistent for prescribed medication 

## 2019-01-20 ENCOUNTER — Telehealth: Payer: Self-pay

## 2019-01-20 NOTE — Telephone Encounter (Signed)
Yes, that's fine  thx

## 2019-01-20 NOTE — Telephone Encounter (Signed)
[facility] called stating Concerta Rx says do not fill before 01/22/2019 and patient last filled 12/20/2018. Can Rx be filled early?

## 2019-01-20 NOTE — Telephone Encounter (Signed)
[facility] pharmacy notified

## 2019-02-18 ENCOUNTER — Telehealth: Payer: Self-pay | Admitting: *Deleted

## 2019-02-18 DIAGNOSIS — F0789 Other personality and behavioral disorders due to known physiological condition: Secondary | ICD-10-CM

## 2019-02-18 DIAGNOSIS — F068 Other specified mental disorders due to known physiological condition: Secondary | ICD-10-CM

## 2019-02-18 DIAGNOSIS — F079 Unspecified personality and behavioral disorder due to known physiological condition: Secondary | ICD-10-CM

## 2019-02-18 DIAGNOSIS — G939 Disorder of brain, unspecified: Secondary | ICD-10-CM

## 2019-02-18 DIAGNOSIS — R4189 Other symptoms and signs involving cognitive functions and awareness: Secondary | ICD-10-CM

## 2019-02-18 DIAGNOSIS — S069X3S Unspecified intracranial injury with loss of consciousness of 1 hour to 5 hours 59 minutes, sequela: Secondary | ICD-10-CM

## 2019-02-18 MED ORDER — METHYLPHENIDATE HCL ER (OSM) 36 MG PO TBCR: 36.0000 mg | EXTENDED_RELEASE_TABLET | Freq: Every day | ORAL | 0 refills | Status: DC

## 2019-02-18 NOTE — Telephone Encounter (Signed)
Mrs Taub called and requested a refill be sent to the pharmacy for [place] of his Concerta. She would like for [place] to notify her when this has been done.

## 2019-02-18 NOTE — Telephone Encounter (Signed)
meds sent in. I notified patient

## 2019-04-17 DIAGNOSIS — H903 Sensorineural hearing loss, bilateral: Secondary | ICD-10-CM | POA: Diagnosis not present

## 2019-04-18 ENCOUNTER — Telehealth: Payer: Self-pay

## 2019-04-18 ENCOUNTER — Other Ambulatory Visit: Payer: Self-pay | Admitting: Family Medicine

## 2019-04-18 DIAGNOSIS — S069X0S Unspecified intracranial injury without loss of consciousness, sequela: Secondary | ICD-10-CM

## 2019-04-18 DIAGNOSIS — F079 Unspecified personality and behavioral disorder due to known physiological condition: Secondary | ICD-10-CM

## 2019-04-18 DIAGNOSIS — F068 Other specified mental disorders due to known physiological condition: Secondary | ICD-10-CM

## 2019-04-18 DIAGNOSIS — H903 Sensorineural hearing loss, bilateral: Secondary | ICD-10-CM | POA: Diagnosis not present

## 2019-04-18 DIAGNOSIS — F0789 Other personality and behavioral disorders due to known physiological condition: Secondary | ICD-10-CM

## 2019-04-18 DIAGNOSIS — S069X3S Unspecified intracranial injury with loss of consciousness of 1 hour to 5 hours 59 minutes, sequela: Secondary | ICD-10-CM

## 2019-04-18 MED ORDER — METHYLPHENIDATE HCL ER (OSM) 36 MG PO TBCR: 36.0000 mg | EXTENDED_RELEASE_TABLET | Freq: Every day | ORAL | 0 refills | Status: DC

## 2019-04-18 NOTE — Telephone Encounter (Signed)
Patients wife called requesting a refill of Justin Fox concerta.

## 2019-04-18 NOTE — Telephone Encounter (Signed)
RF for January and Feb sent to pharmacy. thx

## 2019-04-24 DIAGNOSIS — F068 Other specified mental disorders due to known physiological condition: Secondary | ICD-10-CM | POA: Diagnosis not present

## 2019-04-24 DIAGNOSIS — F5104 Psychophysiologic insomnia: Secondary | ICD-10-CM | POA: Diagnosis not present

## 2019-04-24 DIAGNOSIS — Z7189 Other specified counseling: Secondary | ICD-10-CM | POA: Diagnosis not present

## 2019-04-24 DIAGNOSIS — S069X0S Unspecified intracranial injury without loss of consciousness, sequela: Secondary | ICD-10-CM | POA: Diagnosis not present

## 2019-04-24 DIAGNOSIS — S069X9S Unspecified intracranial injury with loss of consciousness of unspecified duration, sequela: Secondary | ICD-10-CM | POA: Diagnosis not present

## 2019-04-24 DIAGNOSIS — H539 Unspecified visual disturbance: Secondary | ICD-10-CM | POA: Diagnosis not present

## 2019-05-09 DIAGNOSIS — S069XAS Unspecified intracranial injury with loss of consciousness status unknown, sequela: Secondary | ICD-10-CM | POA: Insufficient documentation

## 2019-05-12 DIAGNOSIS — L821 Other seborrheic keratosis: Secondary | ICD-10-CM | POA: Diagnosis not present

## 2019-05-12 DIAGNOSIS — C44319 Basal cell carcinoma of skin of other parts of face: Secondary | ICD-10-CM | POA: Diagnosis not present

## 2019-05-12 DIAGNOSIS — D485 Neoplasm of uncertain behavior of skin: Secondary | ICD-10-CM | POA: Diagnosis not present

## 2019-05-12 DIAGNOSIS — L570 Actinic keratosis: Secondary | ICD-10-CM | POA: Diagnosis not present

## 2019-05-12 DIAGNOSIS — C44719 Basal cell carcinoma of skin of left lower limb, including hip: Secondary | ICD-10-CM | POA: Diagnosis not present

## 2019-05-12 DIAGNOSIS — X32XXXA Exposure to sunlight, initial encounter: Secondary | ICD-10-CM | POA: Diagnosis not present

## 2019-05-12 DIAGNOSIS — D2271 Melanocytic nevi of right lower limb, including hip: Secondary | ICD-10-CM | POA: Diagnosis not present

## 2019-05-12 DIAGNOSIS — Z85828 Personal history of other malignant neoplasm of skin: Secondary | ICD-10-CM | POA: Diagnosis not present

## 2019-05-12 DIAGNOSIS — D045 Carcinoma in situ of skin of trunk: Secondary | ICD-10-CM | POA: Diagnosis not present

## 2019-05-12 DIAGNOSIS — C44612 Basal cell carcinoma of skin of right upper limb, including shoulder: Secondary | ICD-10-CM | POA: Diagnosis not present

## 2019-05-12 DIAGNOSIS — D2261 Melanocytic nevi of right upper limb, including shoulder: Secondary | ICD-10-CM | POA: Diagnosis not present

## 2019-05-12 DIAGNOSIS — D2262 Melanocytic nevi of left upper limb, including shoulder: Secondary | ICD-10-CM | POA: Diagnosis not present

## 2019-05-12 DIAGNOSIS — C44311 Basal cell carcinoma of skin of nose: Secondary | ICD-10-CM | POA: Diagnosis not present

## 2019-05-28 ENCOUNTER — Telehealth: Payer: Self-pay

## 2019-05-28 NOTE — Telephone Encounter (Signed)
Sharyn Lull from [facility] called stating she had some clinical questions. Call [phone] opt 3 Ref [id number]

## 2019-05-28 NOTE — Telephone Encounter (Signed)
Prior auth for methylphenidate 36 mg ER completed over the phone.  Awaiting response via fax
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
## 2019-06-03 ENCOUNTER — Encounter: Payer: Self-pay | Admitting: Family Medicine

## 2019-06-03 ENCOUNTER — Other Ambulatory Visit: Payer: Self-pay

## 2019-06-03 ENCOUNTER — Ambulatory Visit (INDEPENDENT_AMBULATORY_CARE_PROVIDER_SITE_OTHER): Payer: PPO | Admitting: Family Medicine

## 2019-06-03 VITALS — BP 139/86 | HR 58 | Temp 96.9°F | Resp 18 | Ht 73.0 in | Wt 218.0 lb

## 2019-06-03 DIAGNOSIS — I10 Essential (primary) hypertension: Secondary | ICD-10-CM | POA: Diagnosis not present

## 2019-06-03 DIAGNOSIS — Z114 Encounter for screening for human immunodeficiency virus [HIV]: Secondary | ICD-10-CM

## 2019-06-03 DIAGNOSIS — Z13228 Encounter for screening for other metabolic disorders: Secondary | ICD-10-CM

## 2019-06-03 DIAGNOSIS — Z125 Encounter for screening for malignant neoplasm of prostate: Secondary | ICD-10-CM

## 2019-06-03 DIAGNOSIS — Z130 Encounter for screening for diseases of the blood and blood-forming organs and certain disorders involving the immune mechanism: Secondary | ICD-10-CM

## 2019-06-03 DIAGNOSIS — S069X3S Unspecified intracranial injury with loss of consciousness of 1 hour to 5 hours 59 minutes, sequela: Secondary | ICD-10-CM | POA: Diagnosis not present

## 2019-06-03 DIAGNOSIS — Z1159 Encounter for screening for other viral diseases: Secondary | ICD-10-CM | POA: Diagnosis not present

## 2019-06-03 DIAGNOSIS — Z1329 Encounter for screening for other suspected endocrine disorder: Secondary | ICD-10-CM

## 2019-06-03 DIAGNOSIS — Z0000 Encounter for general adult medical examination without abnormal findings: Secondary | ICD-10-CM | POA: Diagnosis not present

## 2019-06-03 DIAGNOSIS — Z136 Encounter for screening for cardiovascular disorders: Secondary | ICD-10-CM

## 2019-06-04 LAB — LIPID PANEL
Chol/HDL Ratio: 4.8 ratio (ref 0.0–5.0)
Cholesterol, Total: 222 mg/dL — ABNORMAL HIGH (ref 100–199)
HDL: 46 mg/dL (ref >39)
LDL Chol Calc (NIH): 156 mg/dL — ABNORMAL HIGH (ref 0–99)
Triglycerides: 111 mg/dL (ref 0–149)
VLDL Cholesterol Cal: 20 mg/dL (ref 5–40)

## 2019-06-04 LAB — COMPREHENSIVE METABOLIC PANEL
ALT: 16 IU/L (ref 0–44)
AST: 18 IU/L (ref 0–40)
Albumin/Globulin Ratio: 1.8 (ref 1.2–2.2)
Albumin: 4.6 g/dL (ref 3.8–4.9)
Alkaline Phosphatase: 60 IU/L (ref 39–117)
BUN/Creatinine Ratio: 11 (ref 9–20)
BUN: 13 mg/dL (ref 6–24)
Bilirubin Total: 0.4 mg/dL (ref 0.0–1.2)
CO2: 22 mmol/L (ref 20–29)
Calcium: 9.4 mg/dL (ref 8.7–10.2)
Chloride: 103 mmol/L (ref 96–106)
Creatinine, Ser: 1.22 mg/dL (ref 0.76–1.27)
GFR calc Af Amer: 75 mL/min/{1.73_m2} (ref >59)
GFR calc non Af Amer: 64 mL/min/{1.73_m2} (ref >59)
Globulin, Total: 2.5 g/dL (ref 1.5–4.5)
Glucose: 96 mg/dL (ref 65–99)
Potassium: 4.3 mmol/L (ref 3.5–5.2)
Sodium: 140 mmol/L (ref 134–144)
Total Protein: 7.1 g/dL (ref 6.0–8.5)

## 2019-06-04 LAB — CBC
Hematocrit: 43.8 % (ref 37.5–51.0)
Hemoglobin: 14.8 g/dL (ref 13.0–17.7)
MCH: 31 pg (ref 26.6–33.0)
MCHC: 33.8 g/dL (ref 31.5–35.7)
MCV: 92 fL (ref 79–97)
Platelets: 285 10*3/uL (ref 150–450)
RBC: 4.78 x10E6/uL (ref 4.14–5.80)
RDW: 11.7 % (ref 11.6–15.4)
WBC: 6.8 10*3/uL (ref 3.4–10.8)

## 2019-06-04 LAB — PSA: Prostate Specific Ag, Serum: 0.6 ng/mL (ref 0.0–4.0)

## 2019-06-04 LAB — HEPATITIS C ANTIBODY: Hep C Virus Ab: <0.1 s/co ratio (ref 0.0–0.9)

## 2019-06-04 LAB — HIV ANTIBODY (ROUTINE TESTING W REFLEX): HIV Screen 4th Generation wRfx: NONREACTIVE

## 2019-06-04 LAB — TSH: TSH: 1.42 u[IU]/mL (ref 0.450–4.500)

## 2019-06-05 ENCOUNTER — Telehealth: Payer: Self-pay

## 2019-06-05 NOTE — Telephone Encounter (Signed)
-----   Message from Jerrol Banana., MD sent at 06/05/2019  9:10 AM EST ----- Labs stable.  Cholesterol mildly elevated.  Continue to work on diet and exercise.

## 2019-06-05 NOTE — Telephone Encounter (Signed)
Patient advised.

## 2019-06-10 DIAGNOSIS — C44319 Basal cell carcinoma of skin of other parts of face: Secondary | ICD-10-CM | POA: Diagnosis not present

## 2019-06-12 ENCOUNTER — Telehealth: Payer: Self-pay | Admitting: *Deleted

## 2019-06-12 NOTE — Telephone Encounter (Signed)
Received fax indicating that prior approval for methyphenidate ER 36 mh has been:  Approved  05/28/2019 - 05/27/2020

## 2019-06-17 ENCOUNTER — Telehealth: Payer: Self-pay

## 2019-06-17 DIAGNOSIS — F079 Unspecified personality and behavioral disorder due to known physiological condition: Secondary | ICD-10-CM

## 2019-06-17 DIAGNOSIS — S069X3S Unspecified intracranial injury with loss of consciousness of 1 hour to 5 hours 59 minutes, sequela: Secondary | ICD-10-CM

## 2019-06-17 DIAGNOSIS — R4189 Other symptoms and signs involving cognitive functions and awareness: Secondary | ICD-10-CM

## 2019-06-17 DIAGNOSIS — G939 Disorder of brain, unspecified: Secondary | ICD-10-CM

## 2019-06-17 DIAGNOSIS — F0789 Other personality and behavioral disorders due to known physiological condition: Secondary | ICD-10-CM

## 2019-06-17 DIAGNOSIS — F068 Other specified mental disorders due to known physiological condition: Secondary | ICD-10-CM

## 2019-06-17 DIAGNOSIS — C44311 Basal cell carcinoma of skin of nose: Secondary | ICD-10-CM | POA: Diagnosis not present

## 2019-06-17 DIAGNOSIS — S069X0S Unspecified intracranial injury without loss of consciousness, sequela: Secondary | ICD-10-CM

## 2019-06-17 NOTE — Telephone Encounter (Signed)
Patients wife called for a refill of Concerta medication for patient.  According to last PMP:  Written/dispensed  Medication    Amount Provider. 05/19/2019   Methylphenidate Er 36 Mg Tab 30.00  Za Swa

## 2019-06-18 MED ORDER — METHYLPHENIDATE HCL ER (OSM) 36 MG PO TBCR: 36.0000 mg | EXTENDED_RELEASE_TABLET | Freq: Every day | ORAL | 0 refills | Status: DC

## 2019-06-18 NOTE — Telephone Encounter (Signed)
Methylphenidate refilled for this month and next

## 2019-06-24 DIAGNOSIS — C44619 Basal cell carcinoma of skin of left upper limb, including shoulder: Secondary | ICD-10-CM | POA: Diagnosis not present

## 2019-06-24 DIAGNOSIS — D045 Carcinoma in situ of skin of trunk: Secondary | ICD-10-CM | POA: Diagnosis not present

## 2019-06-25 ENCOUNTER — Other Ambulatory Visit: Payer: Self-pay

## 2019-06-25 ENCOUNTER — Encounter: Payer: PPO | Attending: Physical Medicine & Rehabilitation | Admitting: Physical Medicine & Rehabilitation

## 2019-06-25 ENCOUNTER — Encounter: Payer: Self-pay | Admitting: Physical Medicine & Rehabilitation

## 2019-06-25 VITALS — BP 122/80 | HR 52 | Temp 97.9°F | Ht 73.0 in | Wt 215.4 lb

## 2019-06-25 DIAGNOSIS — S069X3S Unspecified intracranial injury with loss of consciousness of 1 hour to 5 hours 59 minutes, sequela: Secondary | ICD-10-CM

## 2019-06-25 DIAGNOSIS — Z5181 Encounter for therapeutic drug level monitoring: Secondary | ICD-10-CM

## 2019-06-25 DIAGNOSIS — G894 Chronic pain syndrome: Secondary | ICD-10-CM

## 2019-06-25 DIAGNOSIS — Z79891 Long term (current) use of opiate analgesic: Secondary | ICD-10-CM | POA: Diagnosis not present

## 2019-06-25 DIAGNOSIS — G9389 Other specified disorders of brain: Secondary | ICD-10-CM

## 2019-06-25 DIAGNOSIS — F0789 Other personality and behavioral disorders due to known physiological condition: Secondary | ICD-10-CM

## 2019-06-25 DIAGNOSIS — F068 Other specified mental disorders due to known physiological condition: Secondary | ICD-10-CM

## 2019-06-25 DIAGNOSIS — S069X0S Unspecified intracranial injury without loss of consciousness, sequela: Secondary | ICD-10-CM

## 2019-06-25 DIAGNOSIS — F079 Unspecified personality and behavioral disorder due to known physiological condition: Secondary | ICD-10-CM

## 2019-06-25 NOTE — Patient Instructions (Addendum)
PLEASE FEEL FREE TO CALL OUR OFFICE WITH ANY PROBLEMS OR QUESTIONS [phone])  WHEN YOU WAKE UP AT NIGHT TRY TO FIND SOME THINGS TO HELP "UNTRACK" YOUR BRAIN.

## 2019-06-25 NOTE — Progress Notes (Signed)
Subjective:    Patient ID: Justin Fox, male    DOB: Dec 11, 1960, 59 y.o.   MRN: [id number]  HPI   Justin Fox is here in follow up of his TBI. He has become an avid hiker and is actually leading groups. 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 He has become an avid reader as well. It has become his "new addiction". As a result he feels that his memory has improved also.   He denies pain. His mood is positive and he's sleeping well although he can wake up early in the morning and may find it hard to fall back to sleep.   He remains on concerta for attention.   Pain Inventory Average Pain 0 Pain Right Now 0 My pain is na  In the last 24 hours, has pain interfered with the following? General activity 0 Relation with others 0 Enjoyment of life 0 What TIME of day is your pain at its worst? na Sleep (in general) Fair  Pain is worse with: na Pain improves with: na Relief from Meds: na  Mobility Do you have any goals in this area?  no  Function disabled: date disabled .  Neuro/Psych No problems in this area  Prior Studies Any changes since last visit?  no  Physicians involved in your care Primary care Dr. Rosanna Randy Dermatologist   Family History  Problem Relation Age of Onset  . Anxiety disorder Mother   . Thyroid disease Mother    Social History   Socioeconomic History  . Marital status: Married    Spouse name: Not on file  . Number of children: Not on file  . Years of education: Not on file  . Highest education level: Not on file  Occupational History  . Not on file  Tobacco Use  . Smoking status: Former Smoker    Packs/day: 1.50    Years: 35.00    Pack years: 52.50    Types: Cigarettes    Quit date: 04/03/2008    Years since quitting: 11.2  . Smokeless tobacco: Never Used  . Tobacco comment: Has been quit for 7-8 years  Substance and Sexual Activity  . Alcohol use: No    Comment: Has had issues with this in the past. Has not drank in 7-8 years.  . Drug  use: No  . Sexual activity: Never  Other Topics Concern  . Not on file  Social History Narrative  . Not on file   Social Determinants of Health   Financial Resource Strain:   . Difficulty of Paying Living Expenses:   Food Insecurity:   . Worried About Charity fundraiser in the Last Year:   . Arboriculturist in the Last Year:   Transportation Needs:   . Film/video editor (Medical):   Marland Kitchen Lack of Transportation (Non-Medical):   Physical Activity:   . Days of Exercise per Week:   . Minutes of Exercise per Session:   Stress:   . Feeling of Stress :   Social Connections:   . Frequency of Communication with Friends and Family:   . Frequency of Social Gatherings with Friends and Family:   . Attends Religious Services:   . Active Member of Clubs or Organizations:   . Attends Archivist Meetings:   Marland Kitchen Marital Status:    Past Surgical History:  Procedure Laterality Date  . COLONOSCOPY WITH PROPOFOL N/A 10/12/2015   Procedure: COLONOSCOPY WITH PROPOFOL;  Surgeon: Lucilla Lame, MD;  Location: [facility] ENDOSCOPY;  Service: Endoscopy;  Laterality: N/A;  . NO PAST SURGERIES     Past Medical History:  Diagnosis Date  . Hypertension    BP 122/80   Pulse (!) 52   Temp 97.9 F (36.6 C)   Ht 6\' 1"  (1.854 m)   Wt 215 lb 6.4 oz (97.7 kg)   SpO2 94%   BMI 28.42 kg/m   Opioid Risk Score:   Fall Risk Score:  `1  Depression screen PHQ 2/9  Depression screen [facility] 2/9 06/03/2019 05/28/2018 11/21/2017 07/25/2017 05/16/2017 05/24/2016 02/11/2016  Decreased Interest 0 0 0 0 0 0 0  Down, Depressed, Hopeless 0 0 0 0 0 0 0  PHQ - 2 Score 0 0 0 0 0 0 0  Altered sleeping 0 - - - - - 0  Tired, decreased energy 0 - - - - - 0  Change in appetite 0 - - - - - 0  Feeling bad or failure about yourself  0 - - - - - 0  Trouble concentrating 0 - - - - - 0  Moving slowly or fidgety/restless 0 - - - - - 0  Suicidal thoughts 0 - - - - - 0  PHQ-9 Score 0 - - - - - 0  Difficult doing work/chores  Somewhat difficult - - - - - -    Review of Systems  Constitutional: Negative.   HENT: Negative.   Eyes: Negative.   Respiratory: Negative.   Gastrointestinal: Negative.   Genitourinary: Negative.   Musculoskeletal: Negative.   Skin: Negative.   Neurological: Negative for speech difficulty and numbness.  Psychiatric/Behavioral: Positive for decreased concentration. The patient is hyperactive.        Objective:   Physical Exam General: No acute distress HEENT: EOMI, oral membranes moist Cards: reg rate  Chest: normal effort Abdomen: Soft, NT, ND Skin: dry, intact Extremities: no edema  Neurological: He is alert and appropriate. Motor:5.5 All 4's Sens: normal Musc improved right shoulder ROM  Gait: stable.  Skin: Warm and dry. Intact.  Psych: pleasant as always, a little impulsive still Cognitive:concentration an issue, improved memory and insight. Still a little impulsive.   Assessment & Plan:    1. 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  -memory and cognitive issues are chronic.. he's using compensatory strategies effectively and has seen improvement in his functional memory      . 2.Mood/behavior -can continue lamictal for mood control. Stable at present          -concerta 36mg  daily.  RF for next month was provided a few days ago -We will continue the controlled substance monitoring program, this consists of regular clinic visits, examinations, routine drug screening, pill counts as well as use of [place] Controlled Substance Reporting System. [place] today.    -UDS today          -may use melatonin  for sleepaditionally -continue seroquel at 100mg  qhs.  -discussed sleep hygiene considerations for when he awakens early in the morning -doing well socially now! 3. HTN perPCP 4. Right  rotator cuff syndrome/adhesive capsulitis:               -functional ROM -essentially resolved    15 minutes of face to face patient care time were spent during this visit. All questions were encouraged and answered.  Follow up with me in 6 months .

## 2019-06-29 LAB — TOXASSURE SELECT,+ANTIDEPR,UR

## 2019-07-01 ENCOUNTER — Telehealth: Payer: Self-pay

## 2019-07-01 NOTE — Telephone Encounter (Signed)
UDS results consistent with medication on file

## 2019-07-19 ENCOUNTER — Other Ambulatory Visit: Payer: Self-pay | Admitting: Family Medicine

## 2019-07-19 NOTE — Telephone Encounter (Signed)
Requested medication (s) are due for refill today: yes  Requested medication (s) are on the active medication list: yes  Last refill:  04/18/19  Future visit scheduled: 9/21  Notes to clinic:  [facility] indicated OV due, pt had CPE on 06/03/19-please review   Requested Prescriptions  Pending Prescriptions Disp Refills   carvedilol (COREG) 12.5 MG tablet [Pharmacy Med Name: Carvedilol 12.5 MG Oral Tablet] 180 tablet 0    Sig: TAKE 1 TABLET BY MOUTH TWICE DAILY WITH MEALS      Cardiovascular:  Beta Blockers Failed - 07/19/2019 10:00 AM      Failed - Valid encounter within last 6 months    Recent Outpatient Visits           1 month ago Annual physical exam   [facility] Jerrol Banana., MD   7 months ago Need for influenza vaccination   [facility] Jerrol Banana., MD   1 year ago Annual physical exam   [facility] Jerrol Banana., MD   1 year ago Benign essential HTN   [facility] Jerrol Banana., MD   2 years ago Annual physical exam   [facility] Jerrol Banana., MD       Future Appointments             In 4 months Jerrol Banana., MD [facility]            Passed - Last BP in normal range    BP Readings from Last 1 Encounters:  06/25/19 122/80          Passed - Last Heart Rate in normal range    Pulse Readings from Last 1 Encounters:  06/25/19 (!) 52

## 2019-08-19 ENCOUNTER — Telehealth: Payer: Self-pay | Admitting: *Deleted

## 2019-08-19 DIAGNOSIS — S069X0S Unspecified intracranial injury without loss of consciousness, sequela: Secondary | ICD-10-CM

## 2019-08-19 DIAGNOSIS — G939 Disorder of brain, unspecified: Secondary | ICD-10-CM

## 2019-08-19 DIAGNOSIS — S069X3S Unspecified intracranial injury with loss of consciousness of 1 hour to 5 hours 59 minutes, sequela: Secondary | ICD-10-CM

## 2019-08-19 MED ORDER — METHYLPHENIDATE HCL ER (OSM) 36 MG PO TBCR: 36.0000 mg | EXTENDED_RELEASE_TABLET | Freq: Every day | ORAL | 0 refills | Status: DC

## 2019-08-19 NOTE — Telephone Encounter (Signed)
Justin Fox called to request Justin Fox's Concerta.  Last fill was 07/20/19 per PMP.

## 2019-08-19 NOTE — Telephone Encounter (Signed)
rx'es written for may and june

## 2019-10-16 ENCOUNTER — Other Ambulatory Visit: Payer: Self-pay

## 2019-10-16 DIAGNOSIS — S069X3S Unspecified intracranial injury with loss of consciousness of 1 hour to 5 hours 59 minutes, sequela: Secondary | ICD-10-CM

## 2019-10-16 DIAGNOSIS — F079 Unspecified personality and behavioral disorder due to known physiological condition: Secondary | ICD-10-CM

## 2019-10-16 DIAGNOSIS — F068 Other specified mental disorders due to known physiological condition: Secondary | ICD-10-CM

## 2019-10-18 MED ORDER — METHYLPHENIDATE HCL ER (OSM) 36 MG PO TBCR: 36.0000 mg | EXTENDED_RELEASE_TABLET | Freq: Every day | ORAL | 0 refills | Status: DC

## 2019-10-20 ENCOUNTER — Other Ambulatory Visit: Payer: Self-pay | Admitting: Family Medicine

## 2019-10-20 NOTE — Telephone Encounter (Signed)
Approved per protocol. Requested Prescriptions  Pending Prescriptions Disp Refills  . carvedilol (COREG) 12.5 MG tablet [Pharmacy Med Name: Carvedilol 12.5 MG Oral Tablet] 180 tablet 0    Sig: TAKE 1 TABLET BY MOUTH TWICE DAILY WITH MEALS     Cardiovascular:  Beta Blockers Passed - 10/20/2019  9:37 AM      Passed - Last BP in normal range    BP Readings from Last 1 Encounters:  06/25/19 122/80         Passed - Last Heart Rate in normal range    Pulse Readings from Last 1 Encounters:  06/25/19 (!) 52         Passed - Valid encounter within last 6 months    Recent Outpatient Visits          4 months ago Annual physical exam   [facility] Jerrol Banana., MD   10 months ago Need for influenza vaccination   [facility] Jerrol Banana., MD   1 year ago Annual physical exam   [facility] Jerrol Banana., MD   1 year ago Benign essential HTN   [facility] Jerrol Banana., MD   2 years ago Annual physical exam   [facility] Jerrol Banana., MD      Future Appointments            In 1 month Jerrol Banana., MD [facility], [place]

## 2019-11-10 DIAGNOSIS — D2261 Melanocytic nevi of right upper limb, including shoulder: Secondary | ICD-10-CM | POA: Diagnosis not present

## 2019-11-10 DIAGNOSIS — D2262 Melanocytic nevi of left upper limb, including shoulder: Secondary | ICD-10-CM | POA: Diagnosis not present

## 2019-11-10 DIAGNOSIS — Z85828 Personal history of other malignant neoplasm of skin: Secondary | ICD-10-CM | POA: Diagnosis not present

## 2019-11-10 DIAGNOSIS — D225 Melanocytic nevi of trunk: Secondary | ICD-10-CM | POA: Diagnosis not present

## 2019-11-10 DIAGNOSIS — X32XXXA Exposure to sunlight, initial encounter: Secondary | ICD-10-CM | POA: Diagnosis not present

## 2019-11-10 DIAGNOSIS — L570 Actinic keratosis: Secondary | ICD-10-CM | POA: Diagnosis not present

## 2019-11-10 DIAGNOSIS — L821 Other seborrheic keratosis: Secondary | ICD-10-CM | POA: Diagnosis not present

## 2019-11-18 ENCOUNTER — Other Ambulatory Visit: Payer: Self-pay

## 2019-11-18 DIAGNOSIS — G939 Disorder of brain, unspecified: Secondary | ICD-10-CM

## 2019-11-18 DIAGNOSIS — S069X3S Unspecified intracranial injury with loss of consciousness of 1 hour to 5 hours 59 minutes, sequela: Secondary | ICD-10-CM

## 2019-11-18 DIAGNOSIS — F0789 Other personality and behavioral disorders due to known physiological condition: Secondary | ICD-10-CM

## 2019-11-18 DIAGNOSIS — F068 Other specified mental disorders due to known physiological condition: Secondary | ICD-10-CM

## 2019-11-18 MED ORDER — METHYLPHENIDATE HCL ER (OSM) 36 MG PO TBCR: 36.0000 mg | EXTENDED_RELEASE_TABLET | Freq: Every day | ORAL | 0 refills | Status: DC

## 2019-12-02 NOTE — Progress Notes (Signed)
I,Roshena L Chambers,acting as a scribe for [name], MD.,have documented all relevant documentation on the behalf of [name], MD,as directed by  [name], MD while in the presence of [name], MD.   Established patient visit   Patient: Justin Fox   DOB: 1960-09-23   59 y.o. Male  MRN: [id number] Visit Date: 12/04/2019  Today's healthcare provider: [name], MD   Chief Complaint  Patient presents with   Hypertension   Subjective    HPI  Overall patient doing very well.  His memory is slowly improving from TBI. Hypertension, follow-up  BP Readings from Last 3 Encounters:  12/04/19 133/86  06/25/19 122/80  06/03/19 139/86   Wt Readings from Last 3 Encounters:  12/04/19 213 lb (96.6 kg)  06/25/19 215 lb 6.4 oz (97.7 kg)  06/03/19 218 lb (98.9 kg)     He was last seen for hypertension 6 months ago.  BP at that visit was 06/03/2019. Management since that visit includes; on carvedilol. He reports good compliance with treatment. He is not having side effects.  He is exercising. He is adherent to low salt diet.   Outside blood pressures are not checked.  He does not smoke.  Use of agents associated with hypertension: none.   ---------------------------------------------------------------------------------------------------  Traumatic Brain Injury: From 06/03/2019-Significant memory issues but overall patient doing well.  He has a very supportive family.  He is seeing physiatry regularly.       Medications: Outpatient Medications Prior to Visit  Medication Sig   ascorbic acid (VITAMIN C) 100 MG tablet Take 100 mg by mouth daily. Reported on 08/11/2015   B Complex-C (SUPER B COMPLEX PO) Take 1 tablet by mouth daily.   carvedilol (COREG) 12.5 MG tablet TAKE 1 TABLET BY MOUTH TWICE DAILY WITH MEALS   Chlorpheniramine Maleate (ALLERGY PO) Take by mouth.   fluticasone (FLONASE) 50 MCG/ACT nasal spray     Garlic 0865 MG CAPS Take 1 capsule by mouth daily.    LamoTRIgine (LAMICTAL XR) 50 MG TB24 24 hour tablet Take 50 mg by mouth daily.   Methylcellulose, Laxative, (CITRUCEL) 500 MG TABS Take 1 tablet by mouth every morning.   methylphenidate (CONCERTA) 36 MG PO CR tablet Take 1 tablet (36 mg total) by mouth daily.   Misc Natural Products (GLUCOSAMINE CHONDROITIN TRIPLE) TABS Take 2 tablets by mouth daily.   Multiple Vitamin tablet Take 1 tablet by mouth daily.    Omega-3 Fatty Acids (FISH OIL ULTRA) 1400 MG CAPS Take 1 capsule by mouth daily.   QUEtiapine (SEROQUEL) 100 MG tablet Take 1 tablet by mouth at bedtime   vitamin E 400 UNIT capsule Take 400 Units by mouth daily.   No facility-administered medications prior to visit.    Review of Systems  Constitutional: Negative for appetite change, chills and fever.  Respiratory: Negative for chest tightness, shortness of breath and wheezing.   Cardiovascular: Negative for chest pain and palpitations.  Gastrointestinal: Negative for abdominal pain, nausea and vomiting.       Objective    BP 133/86 (BP Location: Right Arm, Patient Position: Sitting, Cuff Size: Large)    Pulse (!) 53    Temp 98.2 F (36.8 C) (Oral)    Resp 16    Wt 213 lb (96.6 kg)    BMI 28.10 kg/m     Physical Exam Vitals reviewed.  Constitutional:      Appearance: He is well-developed.  HENT:  Head: Normocephalic.     Right Ear: External ear normal.     Left Ear: External ear normal.     Nose: Nose normal.  Eyes:     General: No scleral icterus.    Conjunctiva/sclera: Conjunctivae normal.  Neck:     Thyroid: No thyromegaly.  Cardiovascular:     Rate and Rhythm: Normal rate and regular rhythm.     Heart sounds: Normal heart sounds.  Pulmonary:     Effort: Pulmonary effort is normal.     Breath sounds: Normal breath sounds.  Abdominal:     Palpations: Abdomen is soft.  Skin:    General: Skin is warm and dry.     Comments: SKs.  Neurological:      Mental Status: He is alert and oriented to person, place, and time. Mental status is at baseline.  Psychiatric:        Mood and Affect: Mood normal.        Behavior: Behavior normal.        Thought Content: Thought content normal.        Judgment: Judgment normal.       No results found for any visits on 12/04/19.  Assessment & Plan     1. Benign essential HTN Well controlled Continue current medications F/u in 6 months for CPE 2.h/o TBI Clinically stable and improving. Return in about 6 months (around 06/02/2020) for CPE.         Demone Lyles Cranford Mon, MD  [facility] [phone] (phone) [phone] (fax)  [place]

## 2019-12-04 ENCOUNTER — Other Ambulatory Visit: Payer: Self-pay

## 2019-12-04 ENCOUNTER — Encounter: Payer: Self-pay | Admitting: Family Medicine

## 2019-12-04 ENCOUNTER — Ambulatory Visit (INDEPENDENT_AMBULATORY_CARE_PROVIDER_SITE_OTHER): Payer: PPO | Admitting: Family Medicine

## 2019-12-04 VITALS — BP 133/86 | HR 53 | Temp 98.2°F | Resp 16 | Wt 213.0 lb

## 2019-12-04 DIAGNOSIS — I10 Essential (primary) hypertension: Secondary | ICD-10-CM | POA: Diagnosis not present

## 2019-12-04 DIAGNOSIS — S069X0S Unspecified intracranial injury without loss of consciousness, sequela: Secondary | ICD-10-CM | POA: Diagnosis not present

## 2019-12-04 DIAGNOSIS — F068 Other specified mental disorders due to known physiological condition: Secondary | ICD-10-CM

## 2019-12-17 ENCOUNTER — Telehealth: Payer: Self-pay

## 2019-12-17 DIAGNOSIS — S069X0S Unspecified intracranial injury without loss of consciousness, sequela: Secondary | ICD-10-CM

## 2019-12-17 DIAGNOSIS — G939 Disorder of brain, unspecified: Secondary | ICD-10-CM

## 2019-12-17 DIAGNOSIS — S069X3S Unspecified intracranial injury with loss of consciousness of 1 hour to 5 hours 59 minutes, sequela: Secondary | ICD-10-CM

## 2019-12-17 MED ORDER — METHYLPHENIDATE HCL ER (OSM) 36 MG PO TBCR: 36.0000 mg | EXTENDED_RELEASE_TABLET | Freq: Every day | ORAL | 0 refills | Status: DC

## 2019-12-17 NOTE — Telephone Encounter (Signed)
concerta refilled

## 2019-12-17 NOTE — Telephone Encounter (Signed)
Need refill on Methylphenidate, last filled #30 on 11/19/2019, next appt 12/24/2019. Wife states he will run out Friday.

## 2019-12-24 ENCOUNTER — Encounter: Payer: Self-pay | Admitting: Physical Medicine & Rehabilitation

## 2019-12-24 ENCOUNTER — Other Ambulatory Visit: Payer: Self-pay

## 2019-12-24 ENCOUNTER — Encounter: Payer: PPO | Attending: Physical Medicine & Rehabilitation | Admitting: Physical Medicine & Rehabilitation

## 2019-12-24 VITALS — BP 119/78 | HR 58 | Temp 97.8°F | Ht 73.0 in | Wt 213.0 lb

## 2019-12-24 DIAGNOSIS — F0789 Other personality and behavioral disorders due to known physiological condition: Secondary | ICD-10-CM | POA: Diagnosis not present

## 2019-12-24 DIAGNOSIS — S069X3S Unspecified intracranial injury with loss of consciousness of 1 hour to 5 hours 59 minutes, sequela: Secondary | ICD-10-CM | POA: Diagnosis not present

## 2019-12-24 DIAGNOSIS — F068 Other specified mental disorders due to known physiological condition: Secondary | ICD-10-CM | POA: Diagnosis not present

## 2019-12-24 DIAGNOSIS — G939 Disorder of brain, unspecified: Secondary | ICD-10-CM

## 2019-12-24 DIAGNOSIS — S069X0S Unspecified intracranial injury without loss of consciousness, sequela: Secondary | ICD-10-CM | POA: Diagnosis not present

## 2019-12-24 DIAGNOSIS — G9389 Other specified disorders of brain: Secondary | ICD-10-CM | POA: Diagnosis not present

## 2019-12-24 MED ORDER — METHYLPHENIDATE HCL ER (OSM) 36 MG PO TBCR: 36.0000 mg | EXTENDED_RELEASE_TABLET | Freq: Every day | ORAL | 0 refills | Status: DC

## 2019-12-24 NOTE — Progress Notes (Signed)
Subjective:    Patient ID: Justin Fox, male    DOB: [birth year], 60 y.o.   MRN: [id number]  HPI   Justin Fox is here in follow up o fhis TBI.  Continues to make general progress.  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  He hikes a lot stays very active.  He feels that his memory is generally improving.  Is wearing hearing aids which help as well.  He does notice some background noise at times possibly related to the hearing aids themselves.  Remains on Concerta for attention and focus and is taking it as prescribed.  He denies any aches or pains.  Sleep patterns are stable.  He is emptying his bowels and bladder normally.  Mood is positive and upbeat.  Wife remains very supportive   Pain Inventory Average Pain 0 Pain Right Now 0 My pain is no pain  In the last 24 hours, has pain interfered with the following? General activity 0 Relation with others 0 Enjoyment of life 0 What TIME of day is your pain at its worst? No pain  Sleep (in general) Fair  Pain is worse with: no pain Pain improves with: no pain Relief from Meds: 0  walk without assistance do you drive?  yes Do you have any goals in this area?  no  disabled: date disabled .  No problems in this area  Any changes since last visit?  no  Any changes since last visit?  no    Family History  Problem Relation Age of Onset  . Anxiety disorder Mother   . Thyroid disease Mother    Social History   Socioeconomic History  . Marital status: Married    Spouse name: Not on file  . Number of children: Not on file  . Years of education: Not on file  . Highest education level: Not on file  Occupational History  . Not on file  Tobacco Use  . Smoking status: Former Smoker    Packs/day: 1.50    Years: 35.00    Pack years: 52.50    Types: Cigarettes    Quit date: 04/03/2008    Years since quitting: 11.7  . Smokeless tobacco: Never Used  . Tobacco  comment: Has been quit for 7-8 years  Substance and Sexual Activity  . Alcohol use: No    Comment: Has had issues with this in the past. Has not drank in 7-8 years.  . Drug use: No  . Sexual activity: Never  Other Topics Concern  . Not on file  Social History Narrative  . Not on file   Social Determinants of Health   Financial Resource Strain:   . Difficulty of Paying Living Expenses: Not on file  Food Insecurity:   . Worried About Charity fundraiser in the Last Year: Not on file  . Ran Out of Food in the Last Year: Not on file  Transportation Needs:   . Lack of Transportation (Medical): Not on file  . Lack of Transportation (Non-Medical): Not on file  Physical Activity:   . Days of Exercise per Week: Not on file  . Minutes of Exercise per Session: Not on file  Stress:   . Feeling of Stress : Not on file  Social Connections:   . Frequency of Communication with Friends and Family: Not on file  . Frequency of Social Gatherings with Friends and Family: Not  on file  . Attends Religious Services: Not on file  . Active Member of Clubs or Organizations: Not on file  . Attends Archivist Meetings: Not on file  . Marital Status: Not on file   Past Surgical History:  Procedure Laterality Date  . COLONOSCOPY WITH PROPOFOL N/A 10/12/2015   Procedure: COLONOSCOPY WITH PROPOFOL;  Surgeon: Lucilla Lame, MD;  Location: [facility] ENDOSCOPY;  Service: Endoscopy;  Laterality: N/A;  . NO PAST SURGERIES     Past Medical History:  Diagnosis Date  . Hypertension    BP 119/78   Pulse (!) 58   Temp 97.8 F (36.6 C)   Ht 6\' 1"  (1.854 m)   Wt 213 lb (96.6 kg)   SpO2 94%   BMI 28.10 kg/m   Opioid Risk Score:   Fall Risk Score:  `1  Depression screen PHQ 2/9  Depression screen [facility] 2/9 06/03/2019 05/28/2018 11/21/2017 07/25/2017 05/16/2017 05/24/2016 02/11/2016  Decreased Interest 0 0 0 0 0 0 0  Down, Depressed, Hopeless 0 0 0 0 0 0 0  PHQ - 2 Score 0 0 0 0 0 0 0  Altered sleeping 0 - -  - - - 0  Tired, decreased energy 0 - - - - - 0  Change in appetite 0 - - - - - 0  Feeling bad or failure about yourself  0 - - - - - 0  Trouble concentrating 0 - - - - - 0  Moving slowly or fidgety/restless 0 - - - - - 0  Suicidal thoughts 0 - - - - - 0  PHQ-9 Score 0 - - - - - 0  Difficult doing work/chores Somewhat difficult - - - - - -    Review of Systems  Constitutional: Negative.   HENT: Negative.   Eyes: Negative.   Respiratory: Negative.   Cardiovascular: Negative.   Gastrointestinal: Negative.   Endocrine: Negative.   Genitourinary: Negative.   Musculoskeletal: Negative.   Skin: Negative.   Allergic/Immunologic: Negative.   Neurological: Negative.   Hematological: Negative.   Psychiatric/Behavioral: Negative.   All other systems reviewed and are negative.      Objective:   Physical Exam  General: No acute distress HEENT: EOMI, oral membranes moist Cards: reg rate  Chest: normal effort Abdomen: Soft, NT, ND Skin: dry, intact Extremities: no edema Neurological: He is alert and appropriate. Motor: 5.5  All 4's Sens: normal Musc  functional range of motion without pain    Gait:   Stable pattern Skin: Warm and dry. Intact.  Psych: Very pleasant, sl impulsive Cognitive: improved concentration and memory. Good awareness and insight.     Assessment & Plan:      1. 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            -memory and cognitive issues are chronic but he uses compensatory strategies effectively and has seen a nice improvement in his functional memory.  2. Mood/behavior           - can continue lamictal for mood control.  Stable at present                     -concerta 36mg  daily. RF wasn't needed today.  Did write a refill for next month           -We will continue the controlled substance monitoring program, this consists of regular clinic visits, examinations, routine drug screening, pill  counts  as well as use of [place] Controlled Substance Reporting System. [place] was [place] today.             -continue melatonin  for sleep aditionally           -continue seroquel at 100mg  qhs.              -continues to engage socially! 3. HTN             per PCP  4. Right rotator cuff syndrome/adhesive capsulitis:               -functional ROM                              15 minutes of face to face patient care time were spent during this visit. All questions were encouraged and answered.  Follow up with me in 6 months .

## 2019-12-24 NOTE — Patient Instructions (Signed)
PLEASE FEEL FREE TO CALL OUR OFFICE WITH ANY PROBLEMS OR QUESTIONS ([phone])      

## 2020-01-25 ENCOUNTER — Other Ambulatory Visit: Payer: Self-pay | Admitting: Family Medicine

## 2020-01-25 NOTE — Telephone Encounter (Signed)
Requested Prescriptions  Pending Prescriptions Disp Refills  . carvedilol (COREG) 12.5 MG tablet [Pharmacy Med Name: Carvedilol 12.5 MG Oral Tablet] 180 tablet 1    Sig: TAKE 1 TABLET BY MOUTH TWICE DAILY WITH MEALS     Cardiovascular:  Beta Blockers Passed - 01/25/2020  3:12 PM      Passed - Last BP in normal range    BP Readings from Last 1 Encounters:  12/24/19 119/78         Passed - Last Heart Rate in normal range    Pulse Readings from Last 1 Encounters:  12/24/19 (!) 58         Passed - Valid encounter within last 6 months    Recent Outpatient Visits          1 month ago Benign essential HTN   [facility] Jerrol Banana., MD   7 months ago Annual physical exam   [facility] Jerrol Banana., MD   1 year ago Need for influenza vaccination   [facility] Jerrol Banana., MD   1 year ago Annual physical exam   [facility] Jerrol Banana., MD   2 years ago Benign essential HTN   [facility] Jerrol Banana., MD      Future Appointments            In 4 months Jerrol Banana., MD [facility] [place]

## 2020-02-16 ENCOUNTER — Telehealth: Payer: Self-pay | Admitting: Registered Nurse

## 2020-02-16 DIAGNOSIS — F068 Other specified mental disorders due to known physiological condition: Secondary | ICD-10-CM

## 2020-02-16 DIAGNOSIS — S069X3S Unspecified intracranial injury with loss of consciousness of 1 hour to 5 hours 59 minutes, sequela: Secondary | ICD-10-CM

## 2020-02-16 DIAGNOSIS — F079 Unspecified personality and behavioral disorder due to known physiological condition: Secondary | ICD-10-CM

## 2020-02-16 DIAGNOSIS — G939 Disorder of brain, unspecified: Secondary | ICD-10-CM

## 2020-02-16 MED ORDER — METHYLPHENIDATE HCL ER (OSM) 36 MG PO TBCR: 36.0000 mg | EXTENDED_RELEASE_TABLET | Freq: Every day | ORAL | 0 refills | Status: DC

## 2020-02-16 NOTE — Telephone Encounter (Signed)
PMP was Reviewed: Concerta e-scribed with post date for December. Mrs. List is aware of the above.

## 2020-02-16 NOTE — Telephone Encounter (Signed)
PMP was Reviewed: Concerta e-scribed today, second script sent for the following month. Placed a call to Mrs. Priest, she is aware of the above and verbalizes understanding.

## 2020-04-15 ENCOUNTER — Telehealth: Payer: Self-pay | Admitting: *Deleted

## 2020-04-15 DIAGNOSIS — G939 Disorder of brain, unspecified: Secondary | ICD-10-CM

## 2020-04-15 DIAGNOSIS — F068 Other specified mental disorders due to known physiological condition: Secondary | ICD-10-CM

## 2020-04-15 DIAGNOSIS — S069X3S Unspecified intracranial injury with loss of consciousness of 1 hour to 5 hours 59 minutes, sequela: Secondary | ICD-10-CM

## 2020-04-15 DIAGNOSIS — S069X0S Unspecified intracranial injury without loss of consciousness, sequela: Secondary | ICD-10-CM

## 2020-04-15 DIAGNOSIS — F079 Unspecified personality and behavioral disorder due to known physiological condition: Secondary | ICD-10-CM

## 2020-04-15 NOTE — Telephone Encounter (Signed)
Mrs Roskos called for a refill on Mr Guilbault's methylphenidate. Per PMP it was last filled 03/21/20. His next appt is 06/23/20 with Dr Naaman Plummer.

## 2020-04-16 MED ORDER — METHYLPHENIDATE HCL ER (OSM) 36 MG PO TBCR: 36.0000 mg | EXTENDED_RELEASE_TABLET | Freq: Every day | ORAL | 0 refills | Status: DC

## 2020-04-16 NOTE — Telephone Encounter (Signed)
RF's sent for jan and feb

## 2020-04-16 NOTE — Telephone Encounter (Signed)
Notified. 

## 2020-04-23 DIAGNOSIS — S069X9S Unspecified intracranial injury with loss of consciousness of unspecified duration, sequela: Secondary | ICD-10-CM | POA: Diagnosis not present

## 2020-04-23 DIAGNOSIS — H539 Unspecified visual disturbance: Secondary | ICD-10-CM | POA: Diagnosis not present

## 2020-04-23 DIAGNOSIS — F068 Other specified mental disorders due to known physiological condition: Secondary | ICD-10-CM | POA: Diagnosis not present

## 2020-05-10 ENCOUNTER — Telehealth: Payer: Self-pay | Admitting: Family Medicine

## 2020-05-10 NOTE — Telephone Encounter (Signed)
Copied from [place] [id number]. Topic: Medicare AWV >> May 10, 2020  2:01 PM Cher Nakai R wrote: Reason for CRM:  Left message for patient to call back and schedule Medicare Annual Wellness Visit (AWV) in office.   If not able to come in the office, please offer to do virtually or by telephone.   No hx of AWV - AWV-I eligible as of 04/03/2020  Please schedule at anytime with [facility].   40 minute appointment  Any questions, please contact me at [phone]

## 2020-05-10 NOTE — Telephone Encounter (Signed)
Patient called to inform the doctor that he is refusing the Medicare Annual Wellness visit.  Patient states he does not need that appt.

## 2020-06-02 NOTE — Progress Notes (Signed)
Complete physical exam   Patient: Justin Fox   DOB: 1961/02/17   60 y.o. Male  MRN: [id number] Visit Date: 06/03/2020  Today's healthcare provider: [name], MD   Chief Complaint  Patient presents with  . Annual Exam   Subjective    Justin Fox is a 60 y.o. male who presents today for a complete physical exam.  He reports consuming a general diet. The patient does not participate in regular exercise at present. He generally feels well. He reports sleeping well. He does not have additional problems to discuss today.  This is his third marriage but he is very happily married.  He has 2 daughters and one grandchild and one grandchild on the way.  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  Past Medical History:  Diagnosis Date  . Hypertension    Past Surgical History:  Procedure Laterality Date  . COLONOSCOPY WITH PROPOFOL N/A 10/12/2015   Procedure: COLONOSCOPY WITH PROPOFOL;  Surgeon: Lucilla Lame, MD;  Location: [facility] ENDOSCOPY;  Service: Endoscopy;  Laterality: N/A;  . NO PAST SURGERIES     Social History   Socioeconomic History  . Marital status: Married    Spouse name: Not on file  . Number of children: Not on file  . Years of education: Not on file  . Highest education level: Not on file  Occupational History  . Not on file  Tobacco Use  . Smoking status: Former Smoker    Packs/day: 1.50    Years: 35.00    Pack years: 52.50    Types: Cigarettes    Quit date: 04/03/2008    Years since quitting: 12.1  . Smokeless tobacco: Never Used  . Tobacco comment: Has been quit for 7-8 years  Substance and Sexual Activity  . Alcohol use: No    Comment: Has had issues with this in the past. Has not drank in 7-8 years.  . Drug use: No  . Sexual activity: Never  Other Topics Concern  . Not on file  Social History Narrative  . Not on file   Social Determinants of Health   Financial Resource Strain: Not on file   Food Insecurity: Not on file  Transportation Needs: Not on file  Physical Activity: Not on file  Stress: Not on file  Social Connections: Not on file  Intimate Partner Violence: Not on file   Family Status  Relation Name Status  . Mother  Alive  . Father  Other       no known history of father  . Brother  Alive  . Daughter  Alive  . Brother  Alive  . Daughter  Alive   Family History  Problem Relation Age of Onset  . Anxiety disorder Mother   . Thyroid disease Mother    No Known Allergies  Patient Care Team: Jerrol Banana., MD as PCP - General (Family Medicine)   Medications: Outpatient Medications Prior to Visit  Medication Sig  . ascorbic acid (VITAMIN C) 100 MG tablet Take 100 mg by mouth daily. Reported on 08/11/2015  . B Complex-C (SUPER B COMPLEX PO) Take 1 tablet by mouth daily.  . carvedilol (COREG) 12.5 MG tablet TAKE 1 TABLET BY MOUTH TWICE DAILY WITH MEALS  . Chlorpheniramine Maleate (ALLERGY PO) Take by mouth.  . fluticasone (FLONASE) 50 MCG/ACT nasal spray   . Garlic 6195 MG CAPS Take 1 capsule by mouth daily.   Marland Kitchen  LamoTRIgine (LAMICTAL XR) 50 MG TB24 24 hour tablet Take 50 mg by mouth daily.  . Methylcellulose, Laxative, (CITRUCEL) 500 MG TABS Take 1 tablet by mouth every morning.  . methylphenidate (CONCERTA) 36 MG PO CR tablet Take 1 tablet (36 mg total) by mouth daily.  . methylphenidate (CONCERTA) 36 MG PO CR tablet Take 1 tablet (36 mg total) by mouth daily.  . Misc Natural Products (GLUCOSAMINE CHONDROITIN TRIPLE) TABS Take 2 tablets by mouth daily.  . Multiple Vitamin tablet Take 1 tablet by mouth daily.   . Omega-3 Fatty Acids (FISH OIL ULTRA) 1400 MG CAPS Take 1 capsule by mouth daily.  . QUEtiapine (SEROQUEL) 100 MG tablet Take 1 tablet by mouth at bedtime  . vitamin E 400 UNIT capsule Take 400 Units by mouth daily.   No facility-administered medications prior to visit.    Review of Systems  All other systems reviewed and are  negative.      Objective    BP 126/89   Pulse 69   Temp 97.9 F (36.6 C)   Resp 16   Ht 6' 2.5" (1.892 m)   Wt 217 lb (98.4 kg)   BMI 27.49 kg/m  BP Readings from Last 3 Encounters:  06/03/20 126/89  12/24/19 119/78  12/04/19 133/86   Wt Readings from Last 3 Encounters:  06/03/20 217 lb (98.4 kg)  12/24/19 213 lb (96.6 kg)  12/04/19 213 lb (96.6 kg)      Physical Exam Vitals reviewed.  Constitutional:      Appearance: He is well-developed.  HENT:     Head: Normocephalic and atraumatic.     Right Ear: External ear normal.     Left Ear: External ear normal.     Ears:     Comments: Hearing aids in place.    Nose: Nose normal.     Mouth/Throat:     Pharynx: Oropharynx is clear.  Eyes:     Conjunctiva/sclera: Conjunctivae normal.     Pupils: Pupils are equal, round, and reactive to light.  Cardiovascular:     Rate and Rhythm: Normal rate and regular rhythm.     Heart sounds: Normal heart sounds.  Pulmonary:     Effort: Pulmonary effort is normal.     Breath sounds: Normal breath sounds.  Abdominal:     General: Bowel sounds are normal.     Palpations: Abdomen is soft.  Genitourinary:    Penis: Normal.      Testes: Normal.     Prostate: Normal.     Rectum: Normal.  Musculoskeletal:     Cervical back: Normal range of motion and neck supple.  Skin:    General: Skin is warm and dry.  Neurological:     Mental Status: He is alert and oriented to person, place, and time. Mental status is at baseline.     Comments: Right pupil mid dilated and nonreactive.Complete hearing loss right ear.  He has slightly slowed speech since his TBI.  This is stable.  Has great problems with memory.  Is to rely on his wife for day-to-day issues and this frustrates him greatly.  His speech and his memory have both improved over the past year.  Psychiatric:        Mood and Affect: Mood normal.        Behavior: Behavior normal.        Thought Content: Thought content normal.         Judgment: Judgment normal.  Last depression screening scores PHQ 2/9 Scores 06/03/2020 06/03/2019 05/28/2018  PHQ - 2 Score 0 0 0  PHQ- 9 Score 0 0 -   Last fall risk screening Fall Risk  06/03/2020  Falls in the past year? 0  Number falls in past yr: 0  Injury with Fall? 0  Risk for fall due to : No Fall Risks  Follow up Falls evaluation completed   Last Audit-C alcohol use screening Alcohol Use Disorder Test (AUDIT) 06/03/2020  1. How often do you have a drink containing alcohol? 0  2. How many drinks containing alcohol do you have on a typical day when you are drinking? 0  3. How often do you have six or more drinks on one occasion? 0  AUDIT-C Score 0  Alcohol Brief Interventions/Follow-up AUDIT Score <7 follow-up not indicated   A score of 3 or more in women, and 4 or more in men indicates increased risk for alcohol abuse, EXCEPT if all of the points are from question 1   No results found for any visits on 06/03/20.  Assessment & Plan    Routine Health Maintenance and Physical Exam  Exercise Activities and Dietary recommendations Goals   None     Immunization History  Administered Date(s) Administered  . Influenza,inj,Quad PF,6+ Mos 04/05/2016, 11/26/2018  . Moderna Sars-Covid-2 Vaccination 06/21/2019, 07/19/2019  . Tdap 07/10/2016    Health Maintenance  Topic Date Due  . COVID-19 Vaccine (3 - Moderna risk 4-dose series) 08/16/2019  . INFLUENZA VACCINE  07/01/2020 (Originally 11/02/2019)  . COLONOSCOPY (Pts 45-80yrs Insurance coverage will need to be confirmed)  10/11/2025  . TETANUS/TDAP  07/11/2026  . Hepatitis C Screening  Completed  . HIV Screening  Completed  . HPV VACCINES  Aged Out    Discussed health benefits of physical activity, and encouraged him to engage in regular exercise appropriate for his age and condition.  1. Annual physical exam Patient has had COVID vaccines including booster - CBC with Differential/Platelet - TSH  2. Benign  essential HTN  - Comprehensive metabolic panel - Lipid panel  3. Prostate cancer screening  - PSA  4. Cognitive deficit as late effect of traumatic brain injury [facility]) He has hearing aids and at times can get overstimulated due to his TBI.  5. Screening for blood or protein in urine  - POCT urinalysis dipstick   No follow-ups on file.     I, [name], MD, have reviewed all documentation for this visit. The documentation on 06/04/20 for the exam, diagnosis, procedures, and orders are all accurate and complete.    Kipper Buch Cranford Mon, MD  [facility] [phone] (phone) [phone] (fax)  [place]

## 2020-06-03 ENCOUNTER — Other Ambulatory Visit: Payer: Self-pay

## 2020-06-03 ENCOUNTER — Ambulatory Visit (INDEPENDENT_AMBULATORY_CARE_PROVIDER_SITE_OTHER): Payer: PPO | Admitting: Family Medicine

## 2020-06-03 ENCOUNTER — Encounter: Payer: Self-pay | Admitting: Family Medicine

## 2020-06-03 VITALS — BP 126/89 | HR 69 | Temp 97.9°F | Resp 16 | Ht 74.5 in | Wt 217.0 lb

## 2020-06-03 DIAGNOSIS — I10 Essential (primary) hypertension: Secondary | ICD-10-CM

## 2020-06-03 DIAGNOSIS — Z1389 Encounter for screening for other disorder: Secondary | ICD-10-CM

## 2020-06-03 DIAGNOSIS — Z0000 Encounter for general adult medical examination without abnormal findings: Secondary | ICD-10-CM

## 2020-06-03 DIAGNOSIS — S069X0S Unspecified intracranial injury without loss of consciousness, sequela: Secondary | ICD-10-CM | POA: Diagnosis not present

## 2020-06-03 DIAGNOSIS — F068 Other specified mental disorders due to known physiological condition: Secondary | ICD-10-CM | POA: Diagnosis not present

## 2020-06-03 DIAGNOSIS — Z125 Encounter for screening for malignant neoplasm of prostate: Secondary | ICD-10-CM | POA: Diagnosis not present

## 2020-06-04 LAB — COMPREHENSIVE METABOLIC PANEL
ALT: 22 IU/L (ref 0–44)
AST: 19 IU/L (ref 0–40)
Albumin/Globulin Ratio: 2 (ref 1.2–2.2)
Albumin: 4.7 g/dL (ref 3.8–4.9)
Alkaline Phosphatase: 58 IU/L (ref 44–121)
BUN/Creatinine Ratio: 15 (ref 10–24)
BUN: 18 mg/dL (ref 8–27)
Bilirubin Total: 0.5 mg/dL (ref 0.0–1.2)
CO2: 23 mmol/L (ref 20–29)
Calcium: 10.1 mg/dL (ref 8.6–10.2)
Chloride: 103 mmol/L (ref 96–106)
Creatinine, Ser: 1.21 mg/dL (ref 0.76–1.27)
Globulin, Total: 2.4 g/dL (ref 1.5–4.5)
Glucose: 91 mg/dL (ref 65–99)
Potassium: 4.4 mmol/L (ref 3.5–5.2)
Sodium: 141 mmol/L (ref 134–144)
Total Protein: 7.1 g/dL (ref 6.0–8.5)
eGFR: 69 mL/min/{1.73_m2} (ref >59)

## 2020-06-04 LAB — CBC WITH DIFFERENTIAL/PLATELET
Basophils Absolute: 0.1 10*3/uL (ref 0.0–0.2)
Basos: 1 %
EOS (ABSOLUTE): 0.5 10*3/uL — ABNORMAL HIGH (ref 0.0–0.4)
Eos: 9 %
Hematocrit: 46.1 % (ref 37.5–51.0)
Hemoglobin: 15.1 g/dL (ref 13.0–17.7)
Immature Grans (Abs): 0 10*3/uL (ref 0.0–0.1)
Immature Granulocytes: 0 %
Lymphocytes Absolute: 2.1 10*3/uL (ref 0.7–3.1)
Lymphs: 34 %
MCH: 29.7 pg (ref 26.6–33.0)
MCHC: 32.8 g/dL (ref 31.5–35.7)
MCV: 91 fL (ref 79–97)
Monocytes Absolute: 0.6 10*3/uL (ref 0.1–0.9)
Monocytes: 9 %
Neutrophils Absolute: 3 10*3/uL (ref 1.4–7.0)
Neutrophils: 47 %
Platelets: 273 10*3/uL (ref 150–450)
RBC: 5.08 x10E6/uL (ref 4.14–5.80)
RDW: 11.8 % (ref 11.6–15.4)
WBC: 6.3 10*3/uL (ref 3.4–10.8)

## 2020-06-04 LAB — POCT URINALYSIS DIPSTICK
Bilirubin, UA: NEGATIVE
Blood, UA: NEGATIVE
Glucose, UA: NEGATIVE
Ketones, UA: NEGATIVE
Leukocytes, UA: NEGATIVE
Nitrite, UA: NEGATIVE
Protein, UA: NEGATIVE
Spec Grav, UA: 1.02 (ref 1.010–1.025)
Urobilinogen, UA: 0.2 E.U./dL
pH, UA: 6 (ref 5.0–8.0)

## 2020-06-04 LAB — LIPID PANEL
Chol/HDL Ratio: 4.9 ratio (ref 0.0–5.0)
Cholesterol, Total: 226 mg/dL — ABNORMAL HIGH (ref 100–199)
HDL: 46 mg/dL (ref >39)
LDL Chol Calc (NIH): 162 mg/dL — ABNORMAL HIGH (ref 0–99)
Triglycerides: 100 mg/dL (ref 0–149)
VLDL Cholesterol Cal: 18 mg/dL (ref 5–40)

## 2020-06-04 LAB — TSH: TSH: 1.41 u[IU]/mL (ref 0.450–4.500)

## 2020-06-04 LAB — PSA: Prostate Specific Ag, Serum: 0.6 ng/mL (ref 0.0–4.0)

## 2020-06-09 DIAGNOSIS — X32XXXA Exposure to sunlight, initial encounter: Secondary | ICD-10-CM | POA: Diagnosis not present

## 2020-06-09 DIAGNOSIS — D225 Melanocytic nevi of trunk: Secondary | ICD-10-CM | POA: Diagnosis not present

## 2020-06-09 DIAGNOSIS — D485 Neoplasm of uncertain behavior of skin: Secondary | ICD-10-CM | POA: Diagnosis not present

## 2020-06-09 DIAGNOSIS — D2271 Melanocytic nevi of right lower limb, including hip: Secondary | ICD-10-CM | POA: Diagnosis not present

## 2020-06-09 DIAGNOSIS — C44519 Basal cell carcinoma of skin of other part of trunk: Secondary | ICD-10-CM | POA: Diagnosis not present

## 2020-06-09 DIAGNOSIS — D2261 Melanocytic nevi of right upper limb, including shoulder: Secondary | ICD-10-CM | POA: Diagnosis not present

## 2020-06-09 DIAGNOSIS — L570 Actinic keratosis: Secondary | ICD-10-CM | POA: Diagnosis not present

## 2020-06-09 DIAGNOSIS — C44319 Basal cell carcinoma of skin of other parts of face: Secondary | ICD-10-CM | POA: Diagnosis not present

## 2020-06-09 DIAGNOSIS — D2262 Melanocytic nevi of left upper limb, including shoulder: Secondary | ICD-10-CM | POA: Diagnosis not present

## 2020-06-09 DIAGNOSIS — L739 Follicular disorder, unspecified: Secondary | ICD-10-CM | POA: Diagnosis not present

## 2020-06-09 DIAGNOSIS — Z85828 Personal history of other malignant neoplasm of skin: Secondary | ICD-10-CM | POA: Diagnosis not present

## 2020-06-11 ENCOUNTER — Other Ambulatory Visit: Payer: Self-pay

## 2020-06-14 MED ORDER — METHYLPHENIDATE HCL ER (OSM) 36 MG PO TBCR
36.0000 mg | EXTENDED_RELEASE_TABLET | Freq: Every day | ORAL | 0 refills | Status: DC
Start: 2020-06-14 — End: 2020-06-23

## 2020-06-23 ENCOUNTER — Ambulatory Visit: Payer: PPO | Admitting: Physical Medicine & Rehabilitation

## 2020-06-23 ENCOUNTER — Encounter: Payer: PPO | Attending: Physical Medicine & Rehabilitation | Admitting: Physical Medicine & Rehabilitation

## 2020-06-23 ENCOUNTER — Encounter: Payer: Self-pay | Admitting: Physical Medicine & Rehabilitation

## 2020-06-23 ENCOUNTER — Other Ambulatory Visit: Payer: Self-pay

## 2020-06-23 VITALS — BP 134/83 | HR 55 | Temp 97.7°F | Ht 73.5 in | Wt 217.0 lb

## 2020-06-23 DIAGNOSIS — Z5181 Encounter for therapeutic drug level monitoring: Secondary | ICD-10-CM | POA: Insufficient documentation

## 2020-06-23 DIAGNOSIS — S069X3S Unspecified intracranial injury with loss of consciousness of 1 hour to 5 hours 59 minutes, sequela: Secondary | ICD-10-CM | POA: Insufficient documentation

## 2020-06-23 DIAGNOSIS — F068 Other specified mental disorders due to known physiological condition: Secondary | ICD-10-CM | POA: Diagnosis not present

## 2020-06-23 DIAGNOSIS — S069X0S Unspecified intracranial injury without loss of consciousness, sequela: Secondary | ICD-10-CM | POA: Insufficient documentation

## 2020-06-23 DIAGNOSIS — F079 Unspecified personality and behavioral disorder due to known physiological condition: Secondary | ICD-10-CM | POA: Diagnosis not present

## 2020-06-23 DIAGNOSIS — Z79891 Long term (current) use of opiate analgesic: Secondary | ICD-10-CM | POA: Insufficient documentation

## 2020-06-23 DIAGNOSIS — G939 Disorder of brain, unspecified: Secondary | ICD-10-CM | POA: Diagnosis not present

## 2020-06-23 MED ORDER — METHYLPHENIDATE HCL ER (OSM) 36 MG PO TBCR: 36.0000 mg | EXTENDED_RELEASE_TABLET | Freq: Every day | ORAL | 0 refills | Status: DC

## 2020-06-23 NOTE — Progress Notes (Signed)
Subjective:    Patient ID: Justin Fox, male    DOB: [birth year], 60 y.o.   MRN: [id number]  HPI   Justin Fox is here in follow up of his TBI and associated deficits. He has noticed improvements in his sleep and subsequently has had improvements in his day to day memory. He stays active physically and with a lot of volunteer work. He reads a lot. He keeps a routine and is sleeping more regularly. He is trying to eat right. Mood has been very up beat. His wife is exgtremely supportive as always. He remains on concerta for attention. He uses seroquel for sleep/mood in addition to lamictal   Pain Inventory Average Pain 0 Pain Right Now 0 My pain is no pain  In the last 24 hours, has pain interfered with the following? General activity 0 Relation with others 0 Enjoyment of life 0 What TIME of day is your pain at its worst? No pain  Sleep (in general) Good  Pain is worse with: no pain Pain improves with: no pain Relief from Meds: 0  Family History  Problem Relation Age of Onset  . Anxiety disorder Mother   . Thyroid disease Mother    Social History   Socioeconomic History  . Marital status: Married    Spouse name: Not on file  . Number of children: Not on file  . Years of education: Not on file  . Highest education level: Not on file  Occupational History  . Not on file  Tobacco Use  . Smoking status: Former Smoker    Packs/day: 1.50    Years: 35.00    Pack years: 52.50    Types: Cigarettes    Quit date: 04/03/2008    Years since quitting: 12.2  . Smokeless tobacco: Never Used  . Tobacco comment: Has been quit for 7-8 years  Substance and Sexual Activity  . Alcohol use: No    Comment: Has had issues with this in the past. Has not drank in 7-8 years.  . Drug use: No  . Sexual activity: Never  Other Topics Concern  . Not on file  Social History Narrative  . Not on file   Social Determinants of Health   Financial Resource Strain: Not on file  Food Insecurity:  Not on file  Transportation Needs: Not on file  Physical Activity: Not on file  Stress: Not on file  Social Connections: Not on file   Past Surgical History:  Procedure Laterality Date  . COLONOSCOPY WITH PROPOFOL N/A 10/12/2015   Procedure: COLONOSCOPY WITH PROPOFOL;  Surgeon: Lucilla Lame, MD;  Location: [facility] ENDOSCOPY;  Service: Endoscopy;  Laterality: N/A;  . NO PAST SURGERIES     Past Surgical History:  Procedure Laterality Date  . COLONOSCOPY WITH PROPOFOL N/A 10/12/2015   Procedure: COLONOSCOPY WITH PROPOFOL;  Surgeon: Lucilla Lame, MD;  Location: [facility] ENDOSCOPY;  Service: Endoscopy;  Laterality: N/A;  . NO PAST SURGERIES     Past Medical History:  Diagnosis Date  . Hypertension    BP 134/83   Pulse (!) 55   Temp 97.7 F (36.5 C)   Ht 6' 1.5" (1.867 m)   Wt 217 lb (98.4 kg)   SpO2 96%   BMI 28.24 kg/m   Opioid Risk Score:   Fall Risk Score:  `1  Depression screen PHQ 2/9  Depression screen [facility] 2/9 06/03/2020 06/03/2019 05/28/2018 11/21/2017 07/25/2017 05/16/2017 05/24/2016  Decreased Interest 0 0 0 0 0 0 0  Down, Depressed,  Hopeless 0 0 0 0 0 0 0  PHQ - 2 Score 0 0 0 0 0 0 0  Altered sleeping 0 0 - - - - -  Tired, decreased energy 0 0 - - - - -  Change in appetite 0 0 - - - - -  Feeling bad or failure about yourself  0 0 - - - - -  Trouble concentrating 0 0 - - - - -  Moving slowly or fidgety/restless 0 0 - - - - -  Suicidal thoughts 0 0 - - - - -  PHQ-9 Score 0 0 - - - - -  Difficult doing work/chores Not difficult at all Somewhat difficult - - - - -  Some recent data might be hidden    Review of Systems  Constitutional: Negative.   HENT: Negative.   Eyes: Negative.   Respiratory: Negative.   Cardiovascular: Negative.   Gastrointestinal: Negative.   Endocrine: Negative.   Genitourinary: Negative.   Musculoskeletal: Negative.   Skin: Negative.   Allergic/Immunologic: Negative.   Neurological: Negative.   Hematological: Negative.   Psychiatric/Behavioral:  Negative.   All other systems reviewed and are negative.      Objective:   Physical Exam General: No acute distress HEENT: EOMI, oral membranes moist Cards: reg rate  Chest: normal effort Abdomen: Soft, NT, ND Skin: dry, intact Extremities: no edema Psych: pleasant and appropriate Neurological: He is alert and appropriate. Oriented x 3. Verbose, sometimes a little tangential and impulsive . Motor: 5.5  All 4's Sens: normal Musc  functional range of motion without pain    Gait:   Stable pattern     Assessment & Plan:      1. 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            -memory and cognitive issues are chronic but improving with compensatory strategies effectively and has seen a nice improvement in his functional memory.   -sleeping better, more of a routine,schedule  -encouraged ongoing stimulation and social immersion  -very happy for him! 2. Mood/behavior           - can continue lamictal for mood control.  Stable at present                     -concerta 36mg  daily. RF wasn't needed today.  Did write a refill for next month                    -We will continue the controlled substance monitoring program, this consists of regular clinic visits, examinations, routine drug screening, pill counts as well as use of [place] Controlled Substance Reporting System. [place].    -Medication was refilled and a second prescription was sent to the patient's pharmacy for next month.    -UDS           -continue melatonin  for sleep aditionally           -continue seroquel at 100mg  qhs.               3. HTN             per PCP  4. Right rotator cuff syndrome/adhesive capsulitis:               -functional ROM--improved    Fifteen minutes of face to face patient care time were spent during this visit. All questions were  encouraged and answered.  Follow up with me in 6 mos .

## 2020-06-23 NOTE — Patient Instructions (Signed)
PLEASE FEEL FREE TO CALL OUR OFFICE WITH ANY PROBLEMS OR QUESTIONS ([phone])      

## 2020-06-30 LAB — TOXASSURE SELECT,+ANTIDEPR,UR

## 2020-07-09 ENCOUNTER — Telehealth: Payer: Self-pay | Admitting: *Deleted

## 2020-07-09 NOTE — Telephone Encounter (Signed)
Urine drug screen for this encounter is consistent for prescribed methylphenidate.Justin Fox

## 2020-07-21 ENCOUNTER — Other Ambulatory Visit: Payer: Self-pay | Admitting: Family Medicine

## 2020-08-04 DIAGNOSIS — C44319 Basal cell carcinoma of skin of other parts of face: Secondary | ICD-10-CM | POA: Diagnosis not present

## 2020-08-11 DIAGNOSIS — C44519 Basal cell carcinoma of skin of other part of trunk: Secondary | ICD-10-CM | POA: Diagnosis not present

## 2020-09-10 ENCOUNTER — Other Ambulatory Visit: Payer: Self-pay

## 2020-09-14 MED ORDER — METHYLPHENIDATE HCL ER (OSM) 36 MG PO TBCR: 36.0000 mg | EXTENDED_RELEASE_TABLET | Freq: Every day | ORAL | 0 refills | Status: DC

## 2020-09-14 NOTE — Telephone Encounter (Signed)
Dr Naaman Plummer did not fill before he left Friday. They have called again.  Can you send to the pharmacy? Last fill date was 08/15/20 #30 Next appt is 12/22/20.

## 2020-09-14 NOTE — Telephone Encounter (Signed)
PMP was Reviewed: Methylphenidate e-scribed today. Second prescription sent for the following month. Dr Naaman Plummer note was reviewed. Patient is aware of the above via clinical staff.

## 2020-10-15 ENCOUNTER — Other Ambulatory Visit: Payer: Self-pay | Admitting: Family Medicine

## 2020-11-08 ENCOUNTER — Telehealth: Payer: Self-pay | Admitting: Registered Nurse

## 2020-11-08 ENCOUNTER — Other Ambulatory Visit: Payer: Self-pay

## 2020-11-08 MED ORDER — METHYLPHENIDATE HCL ER (OSM) 36 MG PO TBCR: 36.0000 mg | EXTENDED_RELEASE_TABLET | Freq: Every day | ORAL | 0 refills | Status: DC

## 2020-11-08 NOTE — Telephone Encounter (Signed)
Concerta e-scribed today.

## 2020-11-08 NOTE — Telephone Encounter (Signed)
PMP was Reviewed.  Dr Naaman Plummer note was Reviewed.  Concerta e-scribed today.  Placed a call to Mrs. Christodoulou regarding the above, she verbalizes understanding.

## 2020-12-08 ENCOUNTER — Other Ambulatory Visit: Payer: Self-pay | Admitting: *Deleted

## 2020-12-08 ENCOUNTER — Ambulatory Visit: Payer: Self-pay | Admitting: *Deleted

## 2020-12-08 DIAGNOSIS — U071 COVID-19: Secondary | ICD-10-CM

## 2020-12-08 MED ORDER — NIRMATRELVIR/RITONAVIR (PAXLOVID)TABLET: 3.0000 | ORAL_TABLET | Freq: Two times a day (BID) | ORAL | 0 refills | Status: AC

## 2020-12-08 NOTE — Telephone Encounter (Signed)
Pt's wife calling, pt present. Wife reports she tested positive covid Monday, home test. States pt has developed all same symptoms during night. States can not test him as pt has TBI, "But has all my symptoms." Spoke briefly to pt. with wife on line as pt has cognitive deficits due to TBI.  Reports headache, body aches, back ache, subjective fever "Feels warm" and increased fatigue, mild congestion, "Voice sounds thicker."  HAs been taking DAyQuil and NyQuil "Helping a little." Wife requesting oral anti-viral be called in for pt if Dr. Rosanna Randy thinks appropriate. [facility], [address].  Declined review of self isolation guidelines, "We pretty much know, we don't go anywhere either." Advised symptoms that warrant an ED eval. Assured wife NT would route to practice for PCPs review and final disposition. Please advise: CB# [phone]     Reason for Disposition  [1] HIGH RISK for severe COVID complications (e.g., weak immune system, age > 2 years, obesity with BMI > 25, pregnant, chronic lung disease or other chronic medical condition) AND [2] COVID symptoms (e.g., cough, fever)  (Exceptions: Already seen by PCP and no new or worsening symptoms.)  Answer Assessment - Initial Assessment Questions 1. COVID-19 DIAGNOSIS: "Who made your COVID-19 diagnosis?" "Was it confirmed by a positive lab test or self-test?" If not diagnosed by a doctor (or NP/PA), ask "Are there lots of cases (community spread) where you live?" Note: See public health department website, if unsure.      2. COVID-19 EXPOSURE: "Was there any known exposure to COVID before the symptoms began?" CDC Definition of close contact: within 6 feet (2 meters) for a total of 15 minutes or more over a 24-hour period.      *No Answer* 3. ONSET: "When did the COVID-19 symptoms start?"      *No Answer* 4. WORST SYMPTOM: "What is your worst symptom?" (e.g., cough, fever, shortness of breath, muscle aches)     *No Answer* 5. COUGH: "Do you  have a cough?" If Yes, ask: "How bad is the cough?"       *No Answer* 6. FEVER: "Do you have a fever?" If Yes, ask: "What is your temperature, how was it measured, and when did it start?"     *No Answer* 7. RESPIRATORY STATUS: "Describe your breathing?" (e.g., shortness of breath, wheezing, unable to speak)      *No Answer* 8. BETTER-SAME-WORSE: "Are you getting better, staying the same or getting worse compared to yesterday?"  If getting worse, ask, "In what way?"     *No Answer* 9. HIGH RISK DISEASE: "Do you have any chronic medical problems?" (e.g., asthma, heart or lung disease, weak immune system, obesity, etc.)     *No Answer* 10. VACCINE: "Have you had the COVID-19 vaccine?" If Yes, ask: "Which one, how many shots, when did you get it?"       *No Answer* 11. BOOSTER: "Have you received your COVID-19 booster?" If Yes, ask: "Which one and when did you get it?"       *No Answer* 12. PREGNANCY: "Is there any chance you are pregnant?" "When was your last menstrual period?"       *No Answer* 13. OTHER SYMPTOMS: "Do you have any other symptoms?"  (e.g., chills, fatigue, headache, loss of smell or taste, muscle pain, sore throat)       *No Answer* 14. O2 SATURATION MONITOR:  "Do you use an oxygen saturation monitor (pulse oximeter) at home?" If Yes, ask "What is your reading (oxygen level) today?" "  What is your usual oxygen saturation reading?" (e.g., 95%)       *No Answer*  Protocols used: Coronavirus (COVID-19) Diagnosed or Suspected-A-AH

## 2020-12-08 NOTE — Telephone Encounter (Signed)
Please advise 

## 2020-12-08 NOTE — Telephone Encounter (Signed)
Left detailed message on vm. Rx was sent to pharmacy.

## 2020-12-17 ENCOUNTER — Other Ambulatory Visit: Payer: Self-pay

## 2020-12-20 DIAGNOSIS — D2272 Melanocytic nevi of left lower limb, including hip: Secondary | ICD-10-CM | POA: Diagnosis not present

## 2020-12-20 DIAGNOSIS — L821 Other seborrheic keratosis: Secondary | ICD-10-CM | POA: Diagnosis not present

## 2020-12-20 DIAGNOSIS — D0461 Carcinoma in situ of skin of right upper limb, including shoulder: Secondary | ICD-10-CM | POA: Diagnosis not present

## 2020-12-20 DIAGNOSIS — D2271 Melanocytic nevi of right lower limb, including hip: Secondary | ICD-10-CM | POA: Diagnosis not present

## 2020-12-20 DIAGNOSIS — X32XXXA Exposure to sunlight, initial encounter: Secondary | ICD-10-CM | POA: Diagnosis not present

## 2020-12-20 DIAGNOSIS — C4441 Basal cell carcinoma of skin of scalp and neck: Secondary | ICD-10-CM | POA: Diagnosis not present

## 2020-12-20 DIAGNOSIS — D2261 Melanocytic nevi of right upper limb, including shoulder: Secondary | ICD-10-CM | POA: Diagnosis not present

## 2020-12-20 DIAGNOSIS — C44612 Basal cell carcinoma of skin of right upper limb, including shoulder: Secondary | ICD-10-CM | POA: Diagnosis not present

## 2020-12-20 DIAGNOSIS — D225 Melanocytic nevi of trunk: Secondary | ICD-10-CM | POA: Diagnosis not present

## 2020-12-20 DIAGNOSIS — L570 Actinic keratosis: Secondary | ICD-10-CM | POA: Diagnosis not present

## 2020-12-20 DIAGNOSIS — D485 Neoplasm of uncertain behavior of skin: Secondary | ICD-10-CM | POA: Diagnosis not present

## 2020-12-20 DIAGNOSIS — D2262 Melanocytic nevi of left upper limb, including shoulder: Secondary | ICD-10-CM | POA: Diagnosis not present

## 2020-12-20 MED ORDER — METHYLPHENIDATE HCL ER (OSM) 36 MG PO TBCR: 36.0000 mg | EXTENDED_RELEASE_TABLET | Freq: Every day | ORAL | 0 refills | Status: DC

## 2020-12-20 NOTE — Telephone Encounter (Signed)
Please send refill last filled on 8/10 for #30, by Zella Ball.  Zella Ball is out of the office today). Thank you

## 2020-12-22 ENCOUNTER — Encounter: Payer: PPO | Attending: Physical Medicine & Rehabilitation | Admitting: Physical Medicine & Rehabilitation

## 2020-12-22 ENCOUNTER — Encounter: Payer: Self-pay | Admitting: Physical Medicine & Rehabilitation

## 2020-12-22 ENCOUNTER — Other Ambulatory Visit: Payer: Self-pay

## 2020-12-22 VITALS — BP 125/81 | HR 55 | Temp 98.0°F | Ht 73.5 in | Wt 207.0 lb

## 2020-12-22 DIAGNOSIS — S069X0S Unspecified intracranial injury without loss of consciousness, sequela: Secondary | ICD-10-CM

## 2020-12-22 DIAGNOSIS — F068 Other specified mental disorders due to known physiological condition: Secondary | ICD-10-CM | POA: Diagnosis not present

## 2020-12-22 MED ORDER — METHYLPHENIDATE HCL ER (OSM) 36 MG PO TBCR: 36.0000 mg | EXTENDED_RELEASE_TABLET | Freq: Every day | ORAL | 0 refills | Status: DC

## 2020-12-22 NOTE — Patient Instructions (Signed)
PLEASE FEEL FREE TO CALL OUR OFFICE WITH ANY PROBLEMS OR QUESTIONS ([phone])      

## 2020-12-22 NOTE — Progress Notes (Signed)
Subjective:    Patient ID: Justin Fox, male    DOB: 28-Aug-1960, 60 y.o.   MRN: [id number]  HPI Justin Fox is here in follow-up of his traumatic brain injury.  He has been doing fairly well over the last several months.  He remains active outdoors.  He leads a hiking group.  He does a lot of woodworking as well as showed me a couple projects he is done which were impressive.  He feels that his reading capabilities are continue to improve.  His mood remains very positive.  He is sleeping very well.  He is not having any pain at this point.  He remains on Concerta 36 mg p.o. daily.  Pain Inventory Average Pain 0 Pain Right Now 0 My pain is  No pain  LOCATION OF PAIN  No pain  BOWEL Number of stools per week: 7 or more Oral laxative use Yes  Type of laxative Fiber, OTC tabs Enema or suppository use No  History of colostomy No  Incontinent No   BLADDER Normal   Mobility walk without assistance ability to climb steps?  yes do you drive?  yes Do you have any goals in this area?  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  yes  Neuro/Psych No problems in this area  Prior Studies Any changes since last visit?  no  Physicians involved in your care Any changes since last visit?  no   Family History  Problem Relation Age of Onset   Anxiety disorder Mother    Thyroid disease Mother    Social History   Socioeconomic History   Marital status: Married    Spouse name: Not on file   Number of children: Not on file   Years of education: Not on file   Highest education level: Not on file  Occupational History   Not on file  Tobacco Use   Smoking status: Former    Packs/day: 1.50    Years: 35.00    Pack years: 52.50    Types: Cigarettes    Quit date: 04/03/2008    Years since quitting: 12.7   Smokeless tobacco: Never   Tobacco comments:    Has been quit  for 7-8 years  Vaping Use   Vaping Use: Never used  Substance and Sexual Activity   Alcohol use: No    Comment: Has had issues with this in the past. Has not drank in 7-8 years.   Drug use: No   Sexual activity: Never  Other Topics Concern   Not on file  Social History Narrative   Not on file   Social Determinants of Health   Financial Resource Strain: Not on file  Food Insecurity: Not on file  Transportation Needs: Not on file  Physical Activity: Not on file  Stress: Not on file  Social Connections: Not on file   Past Surgical History:  Procedure Laterality Date   COLONOSCOPY WITH PROPOFOL N/A 10/12/2015   Procedure: COLONOSCOPY WITH PROPOFOL;  Surgeon: Lucilla Lame, MD;  Location: [facility] ENDOSCOPY;  Service: Endoscopy;  Laterality: N/A;   NO PAST SURGERIES     Past Medical History:  Diagnosis Date   Hypertension    BP 125/81   Pulse (!) 55   Temp 98 F (36.7 C)   Ht 6' 1.5" (1.867 m)   Wt 207 lb (93.9 kg)  SpO2 98%   BMI 26.94 kg/m   Opioid Risk Score:   Fall Risk Score:  `1  Depression screen PHQ 2/9  Depression screen [facility] 2/9 06/03/2020 06/03/2019 05/28/2018 11/21/2017 07/25/2017 05/16/2017 05/24/2016  Decreased Interest 0 0 0 0 0 0 0  Down, Depressed, Hopeless 0 0 0 0 0 0 0  PHQ - 2 Score 0 0 0 0 0 0 0  Altered sleeping 0 0 - - - - -  Tired, decreased energy 0 0 - - - - -  Change in appetite 0 0 - - - - -  Feeling bad or failure about yourself  0 0 - - - - -  Trouble concentrating 0 0 - - - - -  Moving slowly or fidgety/restless 0 0 - - - - -  Suicidal thoughts 0 0 - - - - -  PHQ-9 Score 0 0 - - - - -  Difficult doing work/chores Not difficult at all Somewhat difficult - - - - -  Some recent data might be hidden    Review of Systems  Neurological:        Cognitive    All other systems reviewed and are negative.     Objective:   Physical Exam  General: No acute distress HEENT: NCAT, EOMI, oral membranes moist Cards: reg rate  Chest: normal  effort Abdomen: Soft, NT, ND Skin: dry, intact Extremities: no edema Psych: pleasant and appropriate  Neurological: He is alert and appropriate. Oriented x 3. Verbose, sometimes a little tangential.  Very appropriate otherwise Motor: 5/5  All 4's Sens: normal Musc  full ROM    Gait:  normal pattern     Assessment & Plan:      1. 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            -memory and cognitive issues are chronic but improving with compensatory strategies effectively and has seen a nice improvement in his functional memory.            -sleeping better, more of a routine,schedule 2. Mood/behavior           - can continue lamictal for mood control.  Stable at present                     -concerta 36mg  daily. RF today           We will continue the controlled substance monitoring program, this consists of regular clinic visits, examinations, routine drug screening, pill counts as well as use of [place] Controlled Substance Reporting System. [place] was [place] today.              continue melatonin  for sleep aditionally           -continue seroquel at 100mg  qhs.               3. HTN             per PCP  4. Right rotator cuff syndrome/adhesive capsulitis:               -functional ROM--improved   Fifteen minutes of face to face patient care time were spent during this visit. All questions were encouraged and answered.  Follow up with me in 6 mos .

## 2021-01-18 ENCOUNTER — Other Ambulatory Visit: Payer: Self-pay | Admitting: Family Medicine

## 2021-02-01 DIAGNOSIS — C4441 Basal cell carcinoma of skin of scalp and neck: Secondary | ICD-10-CM | POA: Diagnosis not present

## 2021-02-15 DIAGNOSIS — D0461 Carcinoma in situ of skin of right upper limb, including shoulder: Secondary | ICD-10-CM | POA: Diagnosis not present

## 2021-02-15 DIAGNOSIS — C44612 Basal cell carcinoma of skin of right upper limb, including shoulder: Secondary | ICD-10-CM | POA: Diagnosis not present

## 2021-02-16 ENCOUNTER — Other Ambulatory Visit: Payer: Self-pay

## 2021-02-16 DIAGNOSIS — F068 Other specified mental disorders due to known physiological condition: Secondary | ICD-10-CM

## 2021-02-16 DIAGNOSIS — S069X0S Unspecified intracranial injury without loss of consciousness, sequela: Secondary | ICD-10-CM

## 2021-02-16 MED ORDER — METHYLPHENIDATE HCL ER (OSM) 36 MG PO TBCR: 36.0000 mg | EXTENDED_RELEASE_TABLET | Freq: Every day | ORAL | 0 refills | Status: DC

## 2021-03-09 ENCOUNTER — Other Ambulatory Visit: Payer: Self-pay | Admitting: Physical Medicine & Rehabilitation

## 2021-03-09 DIAGNOSIS — F068 Other specified mental disorders due to known physiological condition: Secondary | ICD-10-CM

## 2021-03-09 DIAGNOSIS — S069X0S Unspecified intracranial injury without loss of consciousness, sequela: Secondary | ICD-10-CM

## 2021-03-11 ENCOUNTER — Telehealth: Payer: Self-pay | Admitting: *Deleted

## 2021-03-11 DIAGNOSIS — F068 Other specified mental disorders due to known physiological condition: Secondary | ICD-10-CM

## 2021-03-11 DIAGNOSIS — S069X0S Unspecified intracranial injury without loss of consciousness, sequela: Secondary | ICD-10-CM

## 2021-03-11 MED ORDER — METHYLPHENIDATE HCL ER (OSM) 36 MG PO TBCR: 36.0000 mg | EXTENDED_RELEASE_TABLET | Freq: Every day | ORAL | 0 refills | Status: DC

## 2021-03-11 NOTE — Telephone Encounter (Signed)
Mr and Mrs Catoe have both called about his Concerta refill. He is out and did not have any for this morning. (There was a refill request sent from the pharmacy and should be in your inbox ). Can you send in for them?

## 2021-03-11 NOTE — Telephone Encounter (Signed)
I thought I had but I didn't. Sent rx for 2 mos.

## 2021-03-29 ENCOUNTER — Encounter: Payer: Self-pay | Admitting: Physical Medicine & Rehabilitation

## 2021-03-31 ENCOUNTER — Telehealth: Payer: Self-pay

## 2021-03-31 NOTE — Telephone Encounter (Signed)
PA for Concerta faxed to [facility]

## 2021-04-01 NOTE — Telephone Encounter (Signed)
[facility] faxed back approval: Concerta ER 36 MG will be approved through 04/02/2022.

## 2021-04-25 DIAGNOSIS — F068 Other specified mental disorders due to known physiological condition: Secondary | ICD-10-CM | POA: Diagnosis not present

## 2021-04-25 DIAGNOSIS — S069XAS Unspecified intracranial injury with loss of consciousness status unknown, sequela: Secondary | ICD-10-CM | POA: Diagnosis not present

## 2021-05-04 ENCOUNTER — Other Ambulatory Visit: Payer: Self-pay | Admitting: Physical Medicine & Rehabilitation

## 2021-05-04 DIAGNOSIS — S069X0S Unspecified intracranial injury without loss of consciousness, sequela: Secondary | ICD-10-CM

## 2021-05-04 DIAGNOSIS — F068 Other specified mental disorders due to known physiological condition: Secondary | ICD-10-CM

## 2021-05-06 MED ORDER — METHYLPHENIDATE HCL ER (OSM) 36 MG PO TBCR: 36.0000 mg | EXTENDED_RELEASE_TABLET | Freq: Every day | ORAL | 0 refills | Status: DC

## 2021-05-06 NOTE — Telephone Encounter (Signed)
Concerta filled for 2 mos

## 2021-05-09 ENCOUNTER — Encounter: Payer: Self-pay | Admitting: Physical Medicine & Rehabilitation

## 2021-06-06 ENCOUNTER — Other Ambulatory Visit: Payer: Self-pay

## 2021-06-06 ENCOUNTER — Ambulatory Visit (INDEPENDENT_AMBULATORY_CARE_PROVIDER_SITE_OTHER): Payer: PPO | Admitting: Family Medicine

## 2021-06-06 ENCOUNTER — Encounter: Payer: Self-pay | Admitting: Family Medicine

## 2021-06-06 VITALS — BP 129/81 | HR 60 | Temp 98.3°F | Resp 16 | Ht 74.5 in | Wt 215.0 lb

## 2021-06-06 DIAGNOSIS — Z125 Encounter for screening for malignant neoplasm of prostate: Secondary | ICD-10-CM | POA: Diagnosis not present

## 2021-06-06 DIAGNOSIS — S069X0S Unspecified intracranial injury without loss of consciousness, sequela: Secondary | ICD-10-CM | POA: Diagnosis not present

## 2021-06-06 DIAGNOSIS — Z0000 Encounter for general adult medical examination without abnormal findings: Secondary | ICD-10-CM

## 2021-06-06 DIAGNOSIS — F068 Other specified mental disorders due to known physiological condition: Secondary | ICD-10-CM | POA: Diagnosis not present

## 2021-06-06 DIAGNOSIS — Z1389 Encounter for screening for other disorder: Secondary | ICD-10-CM | POA: Diagnosis not present

## 2021-06-06 DIAGNOSIS — I10 Essential (primary) hypertension: Secondary | ICD-10-CM

## 2021-06-06 DIAGNOSIS — F1021 Alcohol dependence, in remission: Secondary | ICD-10-CM

## 2021-06-06 LAB — POCT URINALYSIS DIPSTICK
Bilirubin, UA: NEGATIVE
Blood, UA: NEGATIVE
Glucose, UA: NEGATIVE
Ketones, UA: NEGATIVE
Leukocytes, UA: NEGATIVE
Nitrite, UA: NEGATIVE
Protein, UA: NEGATIVE
Spec Grav, UA: 1.02 (ref 1.010–1.025)
Urobilinogen, UA: 0.2 E.U./dL
pH, UA: 6 (ref 5.0–8.0)
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
## 2021-06-07 LAB — CBC WITH DIFFERENTIAL/PLATELET
Basophils Absolute: 0.1 10*3/uL (ref 0.0–0.2)
Basos: 1 %
EOS (ABSOLUTE): 0.5 10*3/uL — ABNORMAL HIGH (ref 0.0–0.4)
Eos: 8 %
Hematocrit: 44.2 % (ref 37.5–51.0)
Hemoglobin: 15.3 g/dL (ref 13.0–17.7)
Immature Grans (Abs): 0 10*3/uL (ref 0.0–0.1)
Immature Granulocytes: 0 %
Lymphocytes Absolute: 2.4 10*3/uL (ref 0.7–3.1)
Lymphs: 34 %
MCH: 31.3 pg (ref 26.6–33.0)
MCHC: 34.6 g/dL (ref 31.5–35.7)
MCV: 90 fL (ref 79–97)
Monocytes Absolute: 0.6 10*3/uL (ref 0.1–0.9)
Monocytes: 8 %
Neutrophils Absolute: 3.4 10*3/uL (ref 1.4–7.0)
Neutrophils: 49 %
Platelets: 286 10*3/uL (ref 150–450)
RBC: 4.89 x10E6/uL (ref 4.14–5.80)
RDW: 11.7 % (ref 11.6–15.4)
WBC: 6.9 10*3/uL (ref 3.4–10.8)

## 2021-06-07 LAB — COMPREHENSIVE METABOLIC PANEL
ALT: 22 IU/L (ref 0–44)
AST: 19 IU/L (ref 0–40)
Albumin/Globulin Ratio: 2.1 (ref 1.2–2.2)
Albumin: 4.9 g/dL — ABNORMAL HIGH (ref 3.8–4.8)
Alkaline Phosphatase: 68 IU/L (ref 44–121)
BUN/Creatinine Ratio: 18 (ref 10–24)
BUN: 19 mg/dL (ref 8–27)
Bilirubin Total: 0.4 mg/dL (ref 0.0–1.2)
CO2: 24 mmol/L (ref 20–29)
Calcium: 9.4 mg/dL (ref 8.6–10.2)
Chloride: 103 mmol/L (ref 96–106)
Creatinine, Ser: 1.06 mg/dL (ref 0.76–1.27)
Globulin, Total: 2.3 g/dL (ref 1.5–4.5)
Glucose: 99 mg/dL (ref 70–99)
Potassium: 4.7 mmol/L (ref 3.5–5.2)
Sodium: 142 mmol/L (ref 134–144)
Total Protein: 7.2 g/dL (ref 6.0–8.5)
eGFR: 80 mL/min/{1.73_m2} (ref >59)

## 2021-06-07 LAB — LIPID PANEL
Chol/HDL Ratio: 4.7 ratio (ref 0.0–5.0)
Cholesterol, Total: 202 mg/dL — ABNORMAL HIGH (ref 100–199)
HDL: 43 mg/dL (ref >39)
LDL Chol Calc (NIH): 140 mg/dL — ABNORMAL HIGH (ref 0–99)
Triglycerides: 103 mg/dL (ref 0–149)
VLDL Cholesterol Cal: 19 mg/dL (ref 5–40)

## 2021-06-07 LAB — TSH: TSH: 1.45 u[IU]/mL (ref 0.450–4.500)

## 2021-06-07 LAB — PSA: Prostate Specific Ag, Serum: 0.7 ng/mL (ref 0.0–4.0)

## 2021-06-21 DIAGNOSIS — D2272 Melanocytic nevi of left lower limb, including hip: Secondary | ICD-10-CM | POA: Diagnosis not present

## 2021-06-21 DIAGNOSIS — Z85828 Personal history of other malignant neoplasm of skin: Secondary | ICD-10-CM | POA: Diagnosis not present

## 2021-06-21 DIAGNOSIS — L570 Actinic keratosis: Secondary | ICD-10-CM | POA: Diagnosis not present

## 2021-06-21 DIAGNOSIS — D2261 Melanocytic nevi of right upper limb, including shoulder: Secondary | ICD-10-CM | POA: Diagnosis not present

## 2021-06-21 DIAGNOSIS — X32XXXA Exposure to sunlight, initial encounter: Secondary | ICD-10-CM | POA: Diagnosis not present

## 2021-06-21 DIAGNOSIS — D225 Melanocytic nevi of trunk: Secondary | ICD-10-CM | POA: Diagnosis not present

## 2021-06-21 DIAGNOSIS — D2262 Melanocytic nevi of left upper limb, including shoulder: Secondary | ICD-10-CM | POA: Diagnosis not present

## 2021-06-22 ENCOUNTER — Encounter: Payer: PPO | Attending: Physical Medicine & Rehabilitation | Admitting: Physical Medicine & Rehabilitation

## 2021-06-22 ENCOUNTER — Encounter: Payer: Self-pay | Admitting: Physical Medicine & Rehabilitation

## 2021-06-22 ENCOUNTER — Other Ambulatory Visit: Payer: Self-pay

## 2021-06-22 VITALS — BP 121/79 | HR 63 | Ht 74.5 in | Wt 217.0 lb

## 2021-06-22 DIAGNOSIS — S069X0S Unspecified intracranial injury without loss of consciousness, sequela: Secondary | ICD-10-CM | POA: Insufficient documentation

## 2021-06-22 DIAGNOSIS — S069X3S Unspecified intracranial injury with loss of consciousness of 1 hour to 5 hours 59 minutes, sequela: Secondary | ICD-10-CM | POA: Diagnosis not present

## 2021-06-22 DIAGNOSIS — F068 Other specified mental disorders due to known physiological condition: Secondary | ICD-10-CM | POA: Insufficient documentation

## 2021-06-22 MED ORDER — METHYLPHENIDATE HCL ER (OSM) 36 MG PO TBCR: 36.0000 mg | EXTENDED_RELEASE_TABLET | Freq: Every day | ORAL | 0 refills | Status: DC

## 2021-06-22 NOTE — Patient Instructions (Signed)
PLEASE FEEL FREE TO CALL OUR OFFICE WITH ANY PROBLEMS OR QUESTIONS ([phone])      
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
## 2021-08-01 ENCOUNTER — Ambulatory Visit (INDEPENDENT_AMBULATORY_CARE_PROVIDER_SITE_OTHER): Payer: PPO

## 2021-08-01 VITALS — Ht 73.25 in | Wt 217.9 lb

## 2021-08-01 DIAGNOSIS — Z0000 Encounter for general adult medical examination without abnormal findings: Secondary | ICD-10-CM | POA: Diagnosis not present

## 2021-08-01 NOTE — Progress Notes (Signed)
Subjective:   Justin Fox is a 61 y.o. male who presents for an Initial Medicare Annual Wellness Visit.  Review of Systems           Objective:    There were no vitals filed for this visit. There is no height or weight on file to calculate BMI.     11/22/2016   10:57 AM 09/26/2016    1:05 PM 09/20/2016    9:21 AM 09/20/2016    9:18 AM 08/23/2016    1:10 PM 05/29/2016    8:58 AM 05/24/2016    1:13 PM  Advanced Directives  Does Patient Have a Medical Advance Directive? Yes Yes No No No No No  Type of Paramedic of [place];Living will        Copy of [place] in Chart? No - copy requested        Would patient like information on creating a medical advance directive?       Yes (MAU/Ambulatory/Procedural Areas - Information given)    Current Medications (verified) Outpatient Encounter Medications as of 08/01/2021  Medication Sig   amLODipine (NORVASC) 10 MG tablet Take 1 tablet by mouth daily.   ascorbic acid (VITAMIN C) 100 MG tablet Take 100 mg by mouth daily. Reported on 08/11/2015   B Complex-C (SUPER B COMPLEX PO) Take 1 tablet by mouth daily.   carvedilol (COREG) 12.5 MG tablet TAKE 1 TABLET BY MOUTH TWICE DAILY WITH MEALS   fluoruracil (CARAC) 0.5 % cream Apply twice daily to forehead, temples, cheeks, nose for 4-5 days (Patient not taking: Reported on 12/22/2020)   fluticasone (FLONASE) 50 MCG/ACT nasal spray    Garlic 9518 MG CAPS Take 1 capsule by mouth daily.    lamoTRIgine (LAMICTAL) 25 MG tablet Take by mouth.   Melatonin 10 MG CAPS Take by mouth.   Methylcellulose, Laxative, 500 MG TABS Take 1 tablet by mouth every morning.   methylphenidate (CONCERTA) 36 MG PO CR tablet Take 1 tablet (36 mg total) by mouth daily.   methylphenidate (CONCERTA) 36 MG PO CR tablet Take 1 tablet (36 mg total) by mouth daily.   Misc Natural Products (GLUCOSAMINE CHONDROITIN TRIPLE) TABS Take 2 tablets by mouth daily.   Multiple Vitamin tablet  Take 1 tablet by mouth daily.    Omega-3 Fatty Acids (FISH OIL ULTRA) 1400 MG CAPS Take 1 capsule by mouth daily.   QUEtiapine (SEROQUEL) 100 MG tablet Take 1 tablet by mouth at bedtime   vitamin E 400 UNIT capsule Take 400 Units by mouth daily.   No facility-administered encounter medications on file as of 08/01/2021.    Allergies (verified) Patient has no known allergies.   History: Past Medical History:  Diagnosis Date   Hypertension    Past Surgical History:  Procedure Laterality Date   COLONOSCOPY WITH PROPOFOL N/A 10/12/2015   Procedure: COLONOSCOPY WITH PROPOFOL;  Surgeon: Lucilla Lame, MD;  Location: [facility] ENDOSCOPY;  Service: Endoscopy;  Laterality: N/A;   NO PAST SURGERIES     Family History  Problem Relation Age of Onset   Anxiety disorder Mother    Thyroid disease Mother    Social History   Socioeconomic History   Marital status: Married    Spouse name: Not on file   Number of children: Not on file   Years of education: Not on file   Highest education level: Not on file  Occupational History   Not on file  Tobacco Use  Smoking status: Former    Packs/day: 1.50    Years: 35.00    Pack years: 52.50    Types: Cigarettes    Quit date: 04/03/2008    Years since quitting: 13.3   Smokeless tobacco: Never   Tobacco comments:    Has been quit for 7-8 years  Vaping Use   Vaping Use: Never used  Substance and Sexual Activity   Alcohol use: No    Comment: Has had issues with this in the past. Has not drank in 7-8 years.   Drug use: No   Sexual activity: Never  Other Topics Concern   Not on file  Social History Narrative   Not on file   Social Determinants of Health   Financial Resource Strain: Not on file  Food Insecurity: Not on file  Transportation Needs: Not on file  Physical Activity: Not on file  Stress: Not on file  Social Connections: Not on file    Tobacco Counseling Counseling given: Not Answered Tobacco comments: Has been quit for 7-8  years   Clinical Intake:  Pre-visit preparation completed: Yes  Pain : No/denies pain     Nutritional Risks: None Diabetes: No  How often do you need to have someone help you when you read instructions, pamphlets, or other written materials from your doctor or pharmacy?: 1 - Never  Diabetic?no  Interpreter Needed?: No  Information entered by :: Kirke Shaggy, LPN   Activities of Daily Living    07/28/2021    3:55 PM 06/06/2021   10:25 AM  In your present state of health, do you have any difficulty performing the following activities:  Hearing? 1 0  Vision? 1 0  Difficulty concentrating or making decisions? 1 0  Walking or climbing stairs? 0 0  Dressing or bathing? 0 0  Doing errands, shopping? 0 0  Preparing Food and eating ? Y   Using the Toilet? N   In the past six months, have you accidently leaked urine? N   Do you have problems with loss of bowel control? N   Managing your Medications? Y   Managing your Finances? Y   Housekeeping or managing your Housekeeping? N     Patient Care Team: Jerrol Banana., MD as PCP - General (Family Medicine)  Indicate any recent Medical Services you may have received from other than [facility] providers in the past year (date may be approximate).     Assessment:   This is a routine wellness examination for [facility].  Hearing/Vision screen No results found.  Dietary issues and exercise activities discussed:     Goals Addressed   None    Depression Screen    06/22/2021    9:09 AM 06/06/2021   10:24 AM 12/22/2020    9:11 AM 06/03/2020    9:16 AM 06/03/2019   11:08 AM 05/28/2018    2:26 PM 11/21/2017    9:31 AM  PHQ 2/9 Scores  PHQ - 2 Score 0 0 0 0 0 0 0  PHQ- 9 Score  1  0 0      Fall Risk    07/28/2021    3:55 PM 06/22/2021    9:09 AM 06/06/2021   10:24 AM 06/23/2020    9:24 AM 06/03/2020    9:15 AM  Fall Risk   Falls in the past year? 0 0 0 1 0  Number falls in past yr: 0 0 0  0  Injury with Fall? 0  0  0  Risk  for fall due to :   No Fall Risks  No Fall Risks  Follow up   Falls evaluation completed  Falls evaluation completed    [place]:  Any stairs in or around the home? No  If so, are there any without handrails? No  Home free of loose throw rugs in walkways, pet beds, electrical cords, etc? Yes  Adequate lighting in your home to reduce risk of falls? Yes   ASSISTIVE DEVICES UTILIZED TO PREVENT FALLS:  Life alert? No  Use of a cane, walker or w/c? No  Grab bars in the bathroom? No  Shower chair or bench in shower? No  Elevated toilet seat or a handicapped toilet? No   TIMED UP AND GO:  Was the test performed? Yes .  Length of time to ambulate 10 feet: 4 sec.   Gait steady and fast without use of assistive device  Cognitive Function:        Immunizations Immunization History  Administered Date(s) Administered   Influenza,inj,Quad PF,6+ Mos 04/05/2016, 11/26/2018   Moderna Sars-Covid-2 Vaccination 06/21/2019, 07/19/2019   Tdap 07/10/2016    TDAP status: Up to date  Flu Vaccine status: Declined, Education has been provided regarding the importance of this vaccine but patient still declined. Advised may receive this vaccine at local pharmacy or Health Dept. Aware to provide a copy of the vaccination record if obtained from local pharmacy or Health Dept. Verbalized acceptance and understanding.  Pneumococcal vaccine status: Declined,  Education has been provided regarding the importance of this vaccine but patient still declined. Advised may receive this vaccine at local pharmacy or Health Dept. Aware to provide a copy of the vaccination record if obtained from local pharmacy or Health Dept. Verbalized acceptance and understanding.   Covid-19 vaccine status: Completed vaccines  Qualifies for Shingles Vaccine? No   Zostavax completed No   Shingrix Completed?: No.    Education has been provided regarding the importance of this vaccine. Patient  has been advised to call insurance company to determine out of pocket expense if they have not yet received this vaccine. Advised may also receive vaccine at local pharmacy or Health Dept. Verbalized acceptance and understanding.  Screening Tests Health Maintenance  Topic Date Due   Zoster Vaccines- Shingrix (1 of 2) Never done   COVID-19 Vaccine (3 - Moderna risk series) 08/16/2019   INFLUENZA VACCINE  11/01/2021   COLONOSCOPY (Pts 45-11yr Insurance coverage will need to be confirmed)  10/11/2025   TETANUS/TDAP  07/11/2026   Hepatitis C Screening  Completed   HIV Screening  Completed   HPV VACCINES  Aged Out    Health Maintenance  Health Maintenance Due  Topic Date Due   Zoster Vaccines- Shingrix (1 of 2) Never done   COVID-19 Vaccine (3 - Moderna risk series) 08/16/2019    Colorectal cancer screening: Type of screening: Colonoscopy. Completed 10/12/15. Repeat every 10 years  Lung Cancer Screening: (Low Dose CT Chest recommended if Age 62-80years, 30 pack-year currently smoking OR have quit w/in 15years.) does not qualify.    Additional Screening:  Hepatitis C Screening: does qualify; Completed 06/03/19  Vision Screening: Recommended annual ophthalmology exams for early detection of glaucoma and other disorders of the eye. Is the patient up to date with their annual eye exam?  No  Who is the provider or what is the name of the office in which the patient attends annual eye exams? No one If pt is  not established with a provider, would they like to be referred to a provider to establish care? No .   Dental Screening: Recommended annual dental exams for proper oral hygiene  Community Resource Referral / Chronic Care Management: CRR required this visit?  No   CCM required this visit?  No      Plan:     I have personally reviewed and noted the following in the patient's chart:   Medical and social history Use of alcohol, tobacco or illicit drugs  Current medications  and supplements including opioid prescriptions. Patient is not currently taking opioid prescriptions. Functional ability and status Nutritional status Physical activity Advanced directives List of other physicians Hospitalizations, surgeries, and ER visits in previous 12 months Vitals Screenings to include cognitive, depression, and falls Referrals and appointments  In addition, I have reviewed and discussed with patient certain preventive protocols, quality metrics, and best practice recommendations. A written personalized care plan for preventive services as well as general preventive health recommendations were provided to patient.     Dionisio David, LPN   1/0/3128   Nurse Notes: none

## 2021-08-01 NOTE — Patient Instructions (Signed)
Justin Fox , ?Thank you for taking time to come for your Medicare Wellness Visit. I appreciate your ongoing commitment to your health goals. Please review the following plan we discussed and let me know if I can assist you in the future.  ? ?Screening recommendations/referrals: ?Colonoscopy: 10/12/15 ?Recommended yearly ophthalmology/optometry visit for glaucoma screening and checkup ?Recommended yearly dental visit for hygiene and checkup ? ?Vaccinations: ?Influenza vaccine: n/d ?Pneumococcal vaccine: n/d ?Tdap vaccine: 07/10/16  ?Shingles vaccine: n/d   ?Covid-19: 06/21/19, 07/19/19 ? ?Advanced directives: no ? ?Conditions/risks identified: TBI ? ?Next appointment: Follow up in one year for your annual wellness visit. 08/03/22 @ 8:15am in person ? ?Preventive Care 62 Years and Older, Male ?Preventive care refers to lifestyle choices and visits with your health care provider that can promote health and wellness. ?What does preventive care include? ?A yearly physical exam. This is also called an annual well check. ?Dental exams once or twice a year. ?Routine eye exams. Ask your health care provider how often you should have your eyes checked. ?Personal lifestyle choices, including: ?Daily care of your teeth and gums. ?Regular physical activity. ?Eating a healthy diet. ?Avoiding tobacco and drug use. ?Limiting alcohol use. ?Practicing safe sex. ?Taking low doses of aspirin every day. ?Taking vitamin and mineral supplements as recommended by your health care provider. ?What happens during an annual well check? ?The services and screenings done by your health care provider during your annual well check will depend on your age, overall health, lifestyle risk factors, and family history of disease. ?Counseling  ?Your health care provider may ask you questions about your: ?Alcohol use. ?Tobacco use. ?Drug use. ?Emotional well-being. ?Home and relationship well-being. ?Sexual activity. ?Eating habits. ?History of falls. ?Memory  and ability to understand (cognition). ?Work and work Statistician. ?Screening  ?You may have the following tests or measurements: ?Height, weight, and BMI. ?Blood pressure. ?Lipid and cholesterol levels. These may be checked every 5 years, or more frequently if you are over 74 years old. ?Skin check. ?Lung cancer screening. You may have this screening every year starting at age 53 if you have a 30-pack-year history of smoking and currently smoke or have quit within the past 15 years. ?Fecal occult blood test (FOBT) of the stool. You may have this test every year starting at age 38. ?Flexible sigmoidoscopy or colonoscopy. You may have a sigmoidoscopy every 5 years or a colonoscopy every 10 years starting at age 42. ?Prostate cancer screening. Recommendations will vary depending on your family history and other risks. ?Hepatitis C blood test. ?Hepatitis B blood test. ?Sexually transmitted disease (STD) testing. ?Diabetes screening. This is done by checking your blood sugar (glucose) after you have not eaten for a while (fasting). You may have this done every 1-3 years. ?Abdominal aortic aneurysm (AAA) screening. You may need this if you are a current or former smoker. ?Osteoporosis. You may be screened starting at age 48 if you are at high risk. ?Talk with your health care provider about your test results, treatment options, and if necessary, the need for more tests. ?Vaccines  ?Your health care provider may recommend certain vaccines, such as: ?Influenza vaccine. This is recommended every year. ?Tetanus, diphtheria, and acellular pertussis (Tdap, Td) vaccine. You may need a Td booster every 10 years. ?Zoster vaccine. You may need this after age 21. ?Pneumococcal 13-valent conjugate (PCV13) vaccine. One dose is recommended after age 53. ?Pneumococcal polysaccharide (PPSV23) vaccine. One dose is recommended after age 42. ?Talk to your health care provider  about which screenings and vaccines you need and how often you  need them. ?This information is not intended to replace advice given to you by your health care provider. Make sure you discuss any questions you have with your health care provider. ?Document Released: 04/16/2015 Document Revised: 12/08/2015 Document Reviewed: 01/19/2015 ?Elsevier Interactive Patient Education ? 2017 [place]. ? ?Fall Prevention in the Home ?Falls can cause injuries. They can happen to people of all ages. There are many things you can do to make your home safe and to help prevent falls. ?What can I do on the outside of my home? ?Regularly fix the edges of walkways and driveways and fix any cracks. ?Remove anything that might make you trip as you walk through a door, such as a raised step or threshold. ?Trim any bushes or trees on the path to your home. ?Use bright outdoor lighting. ?Clear any walking paths of anything that might make someone trip, such as rocks or tools. ?Regularly check to see if handrails are loose or broken. Make sure that both sides of any steps have handrails. ?Any raised decks and porches should have guardrails on the edges. ?Have any leaves, snow, or ice cleared regularly. ?Use sand or salt on walking paths during winter. ?Clean up any spills in your garage right away. This includes oil or grease spills. ?What can I do in the bathroom? ?Use night lights. ?Install grab bars by the toilet and in the tub and shower. Do not use towel bars as grab bars. ?Use non-skid mats or decals in the tub or shower. ?If you need to sit down in the shower, use a plastic, non-slip stool. ?Keep the floor dry. Clean up any water that spills on the floor as soon as it happens. ?Remove soap buildup in the tub or shower regularly. ?Attach bath mats securely with double-sided non-slip rug tape. ?Do not have throw rugs and other things on the floor that can make you trip. ?What can I do in the bedroom? ?Use night lights. ?Make sure that you have a light by your bed that is easy to reach. ?Do not  use any sheets or blankets that are too big for your bed. They should not hang down onto the floor. ?Have a firm chair that has side arms. You can use this for support while you get dressed. ?Do not have throw rugs and other things on the floor that can make you trip. ?What can I do in the kitchen? ?Clean up any spills right away. ?Avoid walking on wet floors. ?Keep items that you use a lot in easy-to-reach places. ?If you need to reach something above you, use a strong step stool that has a grab bar. ?Keep electrical cords out of the way. ?Do not use floor polish or wax that makes floors slippery. If you must use wax, use non-skid floor wax. ?Do not have throw rugs and other things on the floor that can make you trip. ?What can I do with my stairs? ?Do not leave any items on the stairs. ?Make sure that there are handrails on both sides of the stairs and use them. Fix handrails that are broken or loose. Make sure that handrails are as long as the stairways. ?Check any carpeting to make sure that it is firmly attached to the stairs. Fix any carpet that is loose or worn. ?Avoid having throw rugs at the top or bottom of the stairs. If you do have throw rugs, attach them to the floor  with carpet tape. ?Make sure that you have a light switch at the top of the stairs and the bottom of the stairs. If you do not have them, ask someone to add them for you. ?What else can I do to help prevent falls? ?Wear shoes that: ?Do not have high heels. ?Have rubber bottoms. ?Are comfortable and fit you well. ?Are closed at the toe. Do not wear sandals. ?If you use a stepladder: ?Make sure that it is fully opened. Do not climb a closed stepladder. ?Make sure that both sides of the stepladder are locked into place. ?Ask someone to hold it for you, if possible. ?Clearly mark and make sure that you can see: ?Any grab bars or handrails. ?First and last steps. ?Where the edge of each step is. ?Use tools that help you move around (mobility  aids) if they are needed. These include: ?Canes. ?Walkers. ?Scooters. ?Crutches. ?Turn on the lights when you go into a dark area. Replace any light bulbs as soon as they burn out. ?Set up your furniture s

## 2021-08-03 ENCOUNTER — Telehealth: Payer: Self-pay | Admitting: *Deleted

## 2021-08-03 DIAGNOSIS — F068 Other specified mental disorders due to known physiological condition: Secondary | ICD-10-CM

## 2021-08-03 MED ORDER — METHYLPHENIDATE HCL ER (OSM) 36 MG PO TBCR
36.0000 mg | EXTENDED_RELEASE_TABLET | Freq: Every day | ORAL | 0 refills | Status: DC
Start: 2021-08-03 — End: 2021-09-29

## 2021-08-03 MED ORDER — METHYLPHENIDATE HCL ER (OSM) 36 MG PO TBCR: 36.0000 mg | EXTENDED_RELEASE_TABLET | Freq: Every day | ORAL | 0 refills | Status: DC

## 2021-08-03 NOTE — Telephone Encounter (Signed)
Refills sent.  I want both active in the chart so that I can keep one "post-dated" please.  thanks ?

## 2021-08-03 NOTE — Telephone Encounter (Signed)
Justin Fox called for a 2 month refill on Justin Fox's Concerta. Next appt is 12/28/21. ?

## 2021-08-04 NOTE — Telephone Encounter (Signed)
Notified. 

## 2021-09-29 ENCOUNTER — Other Ambulatory Visit: Payer: Self-pay | Admitting: Physical Medicine & Rehabilitation

## 2021-09-30 MED ORDER — METHYLPHENIDATE HCL ER (OSM) 36 MG PO TBCR: 36.0000 mg | EXTENDED_RELEASE_TABLET | Freq: Every day | ORAL | 0 refills | Status: DC

## 2021-10-11 DIAGNOSIS — H468 Other optic neuritis: Secondary | ICD-10-CM | POA: Diagnosis not present

## 2021-11-05 ENCOUNTER — Other Ambulatory Visit: Payer: Self-pay | Admitting: Physical Medicine & Rehabilitation

## 2021-11-07 ENCOUNTER — Telehealth: Payer: Self-pay | Admitting: *Deleted

## 2021-11-07 MED ORDER — METHYLPHENIDATE HCL ER (OSM) 36 MG PO TBCR: 36.0000 mg | EXTENDED_RELEASE_TABLET | Freq: Every day | ORAL | 0 refills | Status: DC

## 2021-11-07 NOTE — Telephone Encounter (Signed)
Mrs Justin Fox called again about Cormick's Concerta. He has two days left.

## 2021-12-05 ENCOUNTER — Other Ambulatory Visit: Payer: Self-pay | Admitting: Physical Medicine & Rehabilitation

## 2021-12-06 MED ORDER — METHYLPHENIDATE HCL ER (OSM) 36 MG PO TBCR: 36.0000 mg | EXTENDED_RELEASE_TABLET | Freq: Every day | ORAL | 0 refills | Status: DC

## 2021-12-20 DIAGNOSIS — M25559 Pain in unspecified hip: Secondary | ICD-10-CM | POA: Insufficient documentation

## 2021-12-26 DIAGNOSIS — C44311 Basal cell carcinoma of skin of nose: Secondary | ICD-10-CM | POA: Diagnosis not present

## 2021-12-26 DIAGNOSIS — D2272 Melanocytic nevi of left lower limb, including hip: Secondary | ICD-10-CM | POA: Diagnosis not present

## 2021-12-26 DIAGNOSIS — D0461 Carcinoma in situ of skin of right upper limb, including shoulder: Secondary | ICD-10-CM | POA: Diagnosis not present

## 2021-12-26 DIAGNOSIS — D2261 Melanocytic nevi of right upper limb, including shoulder: Secondary | ICD-10-CM | POA: Diagnosis not present

## 2021-12-26 DIAGNOSIS — D2262 Melanocytic nevi of left upper limb, including shoulder: Secondary | ICD-10-CM | POA: Diagnosis not present

## 2021-12-26 DIAGNOSIS — Z85828 Personal history of other malignant neoplasm of skin: Secondary | ICD-10-CM | POA: Diagnosis not present

## 2021-12-26 DIAGNOSIS — L570 Actinic keratosis: Secondary | ICD-10-CM | POA: Diagnosis not present

## 2021-12-26 DIAGNOSIS — D485 Neoplasm of uncertain behavior of skin: Secondary | ICD-10-CM | POA: Diagnosis not present

## 2021-12-26 DIAGNOSIS — X32XXXA Exposure to sunlight, initial encounter: Secondary | ICD-10-CM | POA: Diagnosis not present

## 2021-12-28 ENCOUNTER — Ambulatory Visit: Payer: Self-pay

## 2021-12-28 ENCOUNTER — Encounter: Payer: PPO | Attending: Physical Medicine & Rehabilitation | Admitting: Physical Medicine & Rehabilitation

## 2021-12-28 ENCOUNTER — Encounter: Payer: Self-pay | Admitting: Physical Medicine & Rehabilitation

## 2021-12-28 VITALS — BP 139/82 | HR 60 | Ht 73.0 in | Wt 225.0 lb

## 2021-12-28 DIAGNOSIS — S069X3S Unspecified intracranial injury with loss of consciousness of 1 hour to 5 hours 59 minutes, sequela: Secondary | ICD-10-CM | POA: Insufficient documentation

## 2021-12-28 DIAGNOSIS — F068 Other specified mental disorders due to known physiological condition: Secondary | ICD-10-CM | POA: Insufficient documentation

## 2021-12-28 DIAGNOSIS — S069X0S Unspecified intracranial injury without loss of consciousness, sequela: Secondary | ICD-10-CM | POA: Insufficient documentation

## 2021-12-28 MED ORDER — METHYLPHENIDATE HCL ER (OSM) 36 MG PO TBCR: 36.0000 mg | EXTENDED_RELEASE_TABLET | Freq: Every day | ORAL | 0 refills | Status: DC

## 2021-12-28 NOTE — Telephone Encounter (Signed)
  Chief Complaint: right hip pain Symptoms: stabbing pain with sitting standing and walking Frequency: 1.5 weeks ago Pertinent Negatives: Patient denies  back pain, pain shooting down leg,  fever, rash Disposition: '[]'$ ED /'[]'$ Urgent Care (no appt availability in office) / '[x]'$ Appointment(In office/virtual)/ '[]'$  [place] [facility]/ '[]'$ Home Care/ '[]'$ Refused Recommended Disposition /'[]'$  Mobile Bus/ '[]'$  Follow-up with PCP Additional Notes: n/a Reason for Disposition  [1] MODERATE pain (e.g., interferes with normal activities, limping) AND [2] present > 3 days  Answer Assessment - Initial Assessment Questions 1. LOCATION and RADIATION: "Where is the pain located?"      Hip  right 2. QUALITY: "What does the pain feel like?"  (e.g., sharp, dull, aching, burning)     Poking him hurts to touch 3. SEVERITY: "How bad is the pain?" "What does it keep you from doing?"   (Scale 1-10; or mild, moderate, severe)   -  MILD (1-3): doesn't interfere with normal activities    -  MODERATE (4-7): interferes with normal activities (e.g., work or school) or awakens from sleep, limping    -  SEVERE (8-10): excruciating pain, unable to do any normal activities, unable to walk     moderate 4. ONSET: "When did the pain start?" "Does it come and go, or is it there all the time?"     1.5 weeks 5. WORK OR EXERCISE: "Has there been any recent work or exercise that involved this part of the body?"      no 6. CAUSE: "What do you think is causing the hip pain?"      unsure 7. AGGRAVATING FACTORS: "What makes the hip pain worse?" (e.g., walking, climbing stairs, running)     Standing, walking or sitting 8. OTHER SYMPTOMS: "Do you have any other symptoms?" (e.g., back pain, pain shooting down leg,  fever, rash)     no  Protocols used: Hip Pain-A-AH

## 2021-12-28 NOTE — Patient Instructions (Signed)
PLEASE FEEL FREE TO CALL OUR OFFICE WITH ANY PROBLEMS OR QUESTIONS ([phone])      

## 2021-12-28 NOTE — Progress Notes (Signed)
Subjective:     Patient ID: Justin Fox, male   DOB: [birth year], 61 y.o.   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 He feels that his memory is improving. He has noticed that he remembers names better. He was pretty emotional last month on the anniversary of his injury. He was also a little emotional with the loss of his dog.   Justin Fox keeps active with his trail walking and building. He's a vertiable [place]!  He remains on ritalin for his attention which allows him to be functional at home and for his hobbies.     Pain Inventory Average Pain 0 Pain Right Now 0 My pain is intermittent  LOCATION OF PAIN  RIGHT HIP PAN  BOWEL Number of stools per week: 7  Oral laxative use No  Type of laxative OTC   BLADDER Normal  Mobility walk without assistance ability to climb steps?  yes do you drive?  yes Do you have any goals in this area?  no  Function disabled: date disabled 2017  Neuro/Psych No problems in this area  Prior Studies Any changes since last visit?  no  Physicians involved in your care Any changes since last visit?  no   Family History  Problem Relation Age of Onset   Anxiety disorder Mother    Thyroid disease Mother    Social History   Socioeconomic History   Marital status: Married    Spouse name: Not on file   Number of children: Not on file   Years of education: Not on file   Highest education level: Not on file  Occupational History   Not on file  Tobacco Use   Smoking status: Former    Packs/day: 1.50    Years: 35.00    Total pack years: 52.50    Types: Cigarettes    Quit date: 04/03/2008    Years since quitting: 13.7   Smokeless tobacco: Never   Tobacco comments:    Has been quit for 7-8 years  Vaping Use   Vaping Use: Never used  Substance and Sexual Activity   Alcohol use: No    Comment: Has had issues with this in the past. Has not drank in 7-8 years.   Drug use: No   Sexual  activity: Never  Other Topics Concern   Not on file  Social History Narrative   Not on file   Social Determinants of Health   Financial Resource Strain: Low Risk  (08/01/2021)   Overall Financial Resource Strain (CARDIA)    Difficulty of Paying Living Expenses: Not hard at all  Food Insecurity: No Food Insecurity (08/01/2021)   Hunger Vital Sign    Worried About Running Out of Food in the Last Year: Never true    [place] in the Last Year: Never true  Transportation Needs: No Transportation Needs (08/01/2021)   PRAPARE - Hydrologist (Medical): No    Lack of Transportation (Non-Medical): No  Physical Activity: Sufficiently Active (08/01/2021)   Exercise Vital Sign    Days of Exercise per Week: 4 days    Minutes of Exercise per Session: 40 min  Stress: No Stress Concern Present (08/01/2021)   [name]    Feeling of Stress : Not at all  Social Connections: Moderately Isolated (08/01/2021)   Social Connection and Isolation Panel [NHANES]    Frequency of  Communication with Friends and Family: Never    Frequency of Social Gatherings with Friends and Family: Once a week    Attends Religious Services: More than 4 times per year    Active Member of Clubs or Organizations: No    Attends Archivist Meetings: Never    Marital Status: Married   Past Surgical History:  Procedure Laterality Date   COLONOSCOPY WITH PROPOFOL N/A 10/12/2015   Procedure: COLONOSCOPY WITH PROPOFOL;  Surgeon: Lucilla Lame, MD;  Location: [facility] ENDOSCOPY;  Service: Endoscopy;  Laterality: N/A;   NO PAST SURGERIES     Past Medical History:  Diagnosis Date   Hypertension    There were no vitals taken for this visit.  Opioid Risk Score:   Fall Risk Score:  `1  Depression screen [facility] 2/9     08/01/2021    8:28 AM 06/22/2021    9:09 AM 06/06/2021   10:24 AM 12/22/2020    9:11 AM 06/03/2020    9:16 AM 06/03/2019    11:08 AM 05/28/2018    2:26 PM  Depression screen PHQ 2/9  Decreased Interest 0 0 0 0 0 0 0  Down, Depressed, Hopeless 0 0 0 0 0 0 0  PHQ - 2 Score 0 0 0 0 0 0 0  Altered sleeping 0  1  0 0   Tired, decreased energy 0  0  0 0   Change in appetite 0  0  0 0   Feeling bad or failure about yourself  0  0  0 0   Trouble concentrating 0  0  0 0   Moving slowly or fidgety/restless 0  0  0 0   Suicidal thoughts 0  0  0 0   PHQ-9 Score 0  1  0 0   Difficult doing work/chores Not difficult at all  Not difficult at all  Not difficult at all Somewhat difficult     Review of Systems  All other systems reviewed and are negative.      Objective:   Physical Exam General: No acute distress HEENT: NCAT, EOMI, oral membranes moist Cards: reg rate  Chest: normal effort Abdomen: Soft, NT, ND Skin: dry, intact Extremities: no edema Psych: pleasant and appropriate  Neurological: oriented x 3. Alert. . Remains tangential and sl impulsive. Improved insight and awareness. Motor: 5/5  All 4's. Balance stable Sens: normal Musc  full ROM , no pain.      Assessment & Plan:      1. 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            -using compensatory strategies,             -sleep is steady 2. Mood/behavior           - can continue lamictal for mood control.  Stable at present                     -concerta '36mg'$  daily. RF today           We will continue the controlled substance monitoring program, this consists of regular clinic visits, examinations, routine drug screening, pill counts as well as use of [place] Controlled Substance Reporting System. [place] was [place] today.              continue melatonin  for sleep aditionally           -continue seroquel at  $'100mg'N$  qhs.               3. HTN- controlled             per PCP  4. Right rotator cuff syndrome/adhesive capsulitis:               -functional ROM--improved    Twenty minutes  of face to face patient care time were spent during this visit. All questions were encouraged and answered.  Follow up with me in 6 mos.

## 2021-12-29 NOTE — Progress Notes (Signed)
Established patient visit  I,Justin Fox,acting as a scribe for [name], Justin Fox.,have documented all relevant documentation on the behalf of Justin Foster, Justin Fox,as directed by  Justin Foster, Justin Fox while in the presence of Justin Foster, Justin Fox.   Patient: Justin Fox   DOB: 1960-08-09   61 y.o. Male  MRN: [id number] Visit Date: 12/30/2021  Today's healthcare provider: [name], Justin Fox   Chief Complaint  Patient presents with   Hip Pain   Subjective    Patient denies any recent trauma including falls or direct blows to the area of pain He and his wife report that he walks anywhere from 15,000-20,000 steps per day to help deal with "adrenaline" Patient denies having significant pain while lying on his right side during the night sleep He denies having pain at radiates into his groin He denies numbness or paresthesias in the right leg   Hip Pain  The incident occurred more than 1 week ago (for a month). There was no injury mechanism. The pain is present in the right hip. The quality of the pain is described as shooting. The pain is at a severity of 4/10. The pain is moderate. The pain has been Fluctuating since onset. Associated symptoms include an inability to bear weight. Pertinent negatives include no numbness or tingling. He reports no foreign bodies present. The symptoms are aggravated by movement, palpation and weight bearing. He has tried rest and heat (Naproxen) for the symptoms. The treatment provided mild relief.     Medications: Outpatient Medications Prior to Visit  Medication Sig   ascorbic acid (VITAMIN C) 100 MG tablet Take 100 mg by mouth daily. Reported on 08/11/2015   B Complex-C (SUPER B COMPLEX PO) Take 1 tablet by mouth daily.   carvedilol (COREG) 12.5 MG tablet TAKE 1 TABLET BY MOUTH TWICE DAILY WITH MEALS   fluticasone (FLONASE) 50 MCG/ACT nasal spray    Garlic 2585 MG CAPS Take 1 capsule by mouth  daily.    lamoTRIgine (LAMICTAL) 25 MG tablet Take by mouth.   Melatonin 10 MG CAPS Take by mouth. Taking 15 mg   Methylcellulose, Laxative, 500 MG TABS Take 1 tablet by mouth every morning.   methylphenidate (CONCERTA) 36 MG PO CR tablet Take 1 tablet (36 mg total) by mouth daily.   Misc Natural Products (GLUCOSAMINE CHONDROITIN TRIPLE) TABS Take 2 tablets by mouth daily.   Multiple Vitamin tablet Take 1 tablet by mouth daily.    Omega-3 Fatty Acids (FISH OIL ULTRA) 1400 MG CAPS Take 1 capsule by mouth daily.   QUEtiapine (SEROQUEL) 100 MG tablet Take 1 tablet by mouth at bedtime.   vitamin E 400 UNIT capsule Take 400 Units by mouth daily.   methylphenidate (CONCERTA) 36 MG PO CR tablet Take 1 tablet (36 mg total) by mouth daily.   No facility-administered medications prior to visit.    Review of Systems  Musculoskeletal:        Hip pain  Neurological:  Negative for tingling and numbness.       Objective    BP 136/89 (BP Location: Left Arm, Patient Position: Sitting, Cuff Size: Large)   Pulse (!) 55   Temp 98 F (36.7 C) (Oral)   Resp 16   Ht '6\' 2"'$  (1.88 m)   Wt 226 lb 1.6 oz (102.6 kg)   BMI 29.03 kg/m    Physical Exam Constitutional:      General: He is not in acute distress.  Appearance: Normal appearance. He is not ill-appearing, toxic-appearing or diaphoretic.  Musculoskeletal:        General: Tenderness present. No signs of injury. Normal range of motion.     Right hip: Tenderness present. Normal range of motion.     Left hip: Normal.     Right lower leg: No edema.     Left lower leg: No edema.       Legs:     Comments: Hip:  - Inspection: No gross deformity - Palpation: TTP over greater trochanter - ROM: Normal range of motion on Flexion, extension, abduction, internal and external rotation - Strength: Normal strength. - Neuro/vasc: NV intact distally - Special Tests: Negative FABER and FADIR.    Neurological:     Mental Status: He is alert.     No  results found for any visits on 12/30/21.  Assessment & Plan     Problem List Items Addressed This Visit       Other   Acute right hip pain - Primary    Acute, new problem Pain on lateral right hip consider diagnosis of greater trochanter bursitis, osteoarthritis in the setting of no acute trauma have low suspicion for fracture at this time We will prescribe meloxicam 7.5 daily with 30-day supply We will send patient for x-ray of right hip Discussed that if x-rays are normal and pain persists despite medical treatment, we can consider treatment for bursitis with an in office injection and if still no improvement we will submit referral to orthopedics for further evaluation and consideration for more advanced imaging, both the patient and his spouse were in agreement with this plan Patient will follow-up in 1 week       Relevant Orders   DG HIP UNILAT WITH PELVIS 2-3 VIEWS RIGHT     No follow-ups on file.     I, Justin Foster, Justin Fox, have reviewed all documentation for this visit. The documentation on 12/30/21 for the exam, diagnosis, procedures, and orders are all accurate and complete.    Justin Foster, Justin Fox  [facility] [phone] (phone) [phone] (fax)  [place]

## 2021-12-30 ENCOUNTER — Ambulatory Visit
Admission: RE | Admit: 2021-12-30 | Discharge: 2021-12-30 | Disposition: A | Discharge status: Home / Self Care | Payer: PPO | Source: Ambulatory Visit | Attending: Family Medicine | Admitting: Family Medicine

## 2021-12-30 ENCOUNTER — Ambulatory Visit (INDEPENDENT_AMBULATORY_CARE_PROVIDER_SITE_OTHER): Payer: PPO | Admitting: Family Medicine

## 2021-12-30 ENCOUNTER — Ambulatory Visit
Admission: RE | Admit: 2021-12-30 | Discharge: 2021-12-30 | Disposition: A | Discharge status: Home / Self Care | Payer: PPO | Attending: Family Medicine | Admitting: Family Medicine

## 2021-12-30 ENCOUNTER — Encounter: Payer: Self-pay | Admitting: Family Medicine

## 2021-12-30 VITALS — BP 136/89 | HR 55 | Temp 98.0°F | Resp 16 | Ht 74.0 in | Wt 226.1 lb

## 2021-12-30 DIAGNOSIS — M25551 Pain in right hip: Secondary | ICD-10-CM | POA: Insufficient documentation

## 2021-12-30 MED ORDER — MELOXICAM 7.5 MG PO TABS: 7.5000 mg | ORAL_TABLET | Freq: Every day | ORAL | 0 refills | Status: DC

## 2021-12-30 NOTE — Assessment & Plan Note (Addendum)
Acute, new problem Pain on lateral right hip consider diagnosis of greater trochanter bursitis, osteoarthritis in the setting of no acute trauma have low suspicion for fracture at this time We will prescribe meloxicam 7.5 daily with 30-day supply We will send patient for x-ray of right hip Discussed that if x-rays are normal and pain persists despite medical treatment, we can consider treatment for bursitis with an in office injection and if still no improvement we will submit referral to orthopedics for further evaluation and consideration for more advanced imaging, both the patient and his spouse were in agreement with this plan Patient will follow-up in 1 week

## 2021-12-30 NOTE — Patient Instructions (Addendum)
It was a pleasure to see you today!  Thank you for choosing [facility] for your primary care.   Justin Fox was seen for right hip pain.   Our plans for today were: I have sent a prescription for meloxicam for you to take daily over the next few weeks.  Please do not take any ibuprofen, Aleve, or Advil while taking this medication. Please go to the [facility] outpatient imaging center to have x-rays taken of your right hip.   Imaging center: [id number], [place], [place] [address]  Please plan follow-up in 1 week to see how your hip pain is doing.  To keep you healthy, please keep in mind the following health maintenance items that you are due for:   COVID-vaccine Shingles vaccine   Best Wishes,   Dr. Quentin Cornwall

## 2022-01-02 DIAGNOSIS — C44311 Basal cell carcinoma of skin of nose: Secondary | ICD-10-CM | POA: Diagnosis not present

## 2022-01-02 DIAGNOSIS — D0461 Carcinoma in situ of skin of right upper limb, including shoulder: Secondary | ICD-10-CM | POA: Diagnosis not present

## 2022-01-06 ENCOUNTER — Encounter: Payer: Self-pay | Admitting: Family Medicine

## 2022-01-06 ENCOUNTER — Ambulatory Visit (INDEPENDENT_AMBULATORY_CARE_PROVIDER_SITE_OTHER): Payer: PPO | Admitting: Family Medicine

## 2022-01-06 VITALS — BP 117/80 | HR 58 | Resp 16 | Ht 74.0 in | Wt 225.0 lb

## 2022-01-06 DIAGNOSIS — M25551 Pain in right hip: Secondary | ICD-10-CM

## 2022-01-06 NOTE — Progress Notes (Signed)
I,Justin Fox,acting as a scribe for [name], MD.,have documented all relevant documentation on the behalf of Justin Foster, MD,as directed by  Justin Foster, MD while in the presence of Justin Foster, MD.   Established patient visit   Patient: Justin Fox   DOB: 04/28/1960   61 y.o. Male  MRN: [id number] Visit Date: 01/06/2022  Today's healthcare provider: [name], MD   Chief Complaint  Patient presents with   Hip Pain   Subjective    HPI  Patient presents for follow-up right hip pain She states that since starting the pain resting, he has noticed he reports that he did have mild pain yesterday after attempting to walk He states that he normally walks 12 miles a day Patient states that he recently purchased casual shoes at department store prior to the onset of hip pain He denies any numbness, weakness in his lower extremities Denies any shooting pains in his lower extremities Falls or injuries to his legs since initial visit   Medications: Outpatient Medications Prior to Visit  Medication Sig   ascorbic acid (VITAMIN C) 100 MG tablet Take 100 mg by mouth daily. Reported on 08/11/2015   B Complex-C (SUPER B COMPLEX PO) Take 1 tablet by mouth daily.   carvedilol (COREG) 12.5 MG tablet TAKE 1 TABLET BY MOUTH TWICE DAILY WITH MEALS   fluticasone (FLONASE) 50 MCG/ACT nasal spray    Garlic 5093 MG CAPS Take 1 capsule by mouth daily.    lamoTRIgine (LAMICTAL) 25 MG tablet Take by mouth.   Melatonin 10 MG CAPS Take by mouth. Taking 15 mg   meloxicam (MOBIC) 7.5 MG tablet Take 1 tablet (7.5 mg total) by mouth daily.   Methylcellulose, Laxative, 500 MG TABS Take 1 tablet by mouth every morning.   methylphenidate (CONCERTA) 36 MG PO CR tablet Take 1 tablet (36 mg total) by mouth daily.   Misc Natural Products (GLUCOSAMINE CHONDROITIN TRIPLE) TABS Take 2 tablets by mouth daily.   Multiple Vitamin tablet Take 1  tablet by mouth daily.    Omega-3 Fatty Acids (FISH OIL ULTRA) 1400 MG CAPS Take 1 capsule by mouth daily.   QUEtiapine (SEROQUEL) 100 MG tablet Take 1 tablet by mouth at bedtime.   vitamin E 400 UNIT capsule Take 400 Units by mouth daily.   No facility-administered medications prior to visit.    Review of Systems  Constitutional:  Negative for appetite change, chills and fever.  Respiratory:  Negative for chest tightness, shortness of breath and wheezing.   Cardiovascular:  Negative for chest pain and palpitations.  Gastrointestinal:  Negative for abdominal pain, nausea and vomiting.  Musculoskeletal:  Positive for arthralgias.       Objective    BP 117/80 (BP Location: Left Arm, Patient Position: Sitting, Cuff Size: Large)   Pulse (!) 58   Resp 16   Ht '6\' 2"'$  (1.88 m)   Wt 225 lb (102.1 kg)   SpO2 95%   BMI 28.89 kg/m    Physical Exam Constitutional:      General: He is not in acute distress.    Appearance: He is not ill-appearing, toxic-appearing or diaphoretic.  Pulmonary:     Effort: Pulmonary effort is normal. No respiratory distress.  Musculoskeletal:     Right hip: Tenderness present. No deformity, bony tenderness or crepitus. Normal range of motion. Normal strength.     Left hip: Normal.     Comments: 5/5 strength of lower extremities bilaterally Patient has normal  gait walking into clinic  Neurological:     Mental Status: He is alert.       No results found for any visits on 01/06/22.  Assessment & Plan     Problem List Items Addressed This Visit       Other   Acute right hip pain - Primary    This is improving He will continue meloxicam for another 3 weeks  Recommended not using the causal shoes for walking any longer  Recommended returning to previous sneakers for walking long distances  Recommended adding shoes inserts if hip pain is reproduced with walking  He normally walks 12 miles per day, recommended trying to gradually return to that  distance and starting with goal of 1-2 miles per day  Advised that if pain returns despite anti-inflammatory meds, inserts and gradual return to walking distance, that we refer to sports med He will follow up with me in 1 month         No follow-ups on file.      I, Justin Foster, MD, have reviewed all documentation for this visit. The documentation on 01/06/22 for the exam, diagnosis, procedures, and orders are all accurate and complete.    Justin Foster, MD  [facility] [phone] (phone) [phone] (fax)  [place]

## 2022-01-06 NOTE — Assessment & Plan Note (Signed)
This is improving He will continue meloxicam for another 3 weeks  Recommended not using the causal shoes for walking any longer  Recommended returning to previous sneakers for walking long distances  Recommended adding shoes inserts if hip pain is reproduced with walking  He normally walks 12 miles per day, recommended trying to gradually return to that distance and starting with goal of 1-2 miles per day  Advised that if pain returns despite anti-inflammatory meds, inserts and gradual return to walking distance, that we refer to sports med He will follow up with me in 1 month

## 2022-01-07 ENCOUNTER — Other Ambulatory Visit: Payer: Self-pay | Admitting: Family Medicine

## 2022-01-09 NOTE — Telephone Encounter (Signed)
Requested Prescriptions  Pending Prescriptions Disp Refills  . carvedilol (COREG) 12.5 MG tablet [Pharmacy Med Name: Carvedilol 12.5 MG Oral Tablet] 180 tablet 0    Sig: TAKE 1 TABLET BY MOUTH TWICE DAILY WITH MEALS     Cardiovascular: Beta Blockers 3 Passed - 01/07/2022  5:22 PM      Passed - Cr in normal range and within 360 days    Creatinine, Ser  Date Value Ref Range Status  06/06/2021 1.06 0.76 - 1.27 mg/dL Final         Passed - AST in normal range and within 360 days    AST  Date Value Ref Range Status  06/06/2021 19 0 - 40 IU/L Final         Passed - ALT in normal range and within 360 days    ALT  Date Value Ref Range Status  06/06/2021 22 0 - 44 IU/L Final         Passed - Last BP in normal range    BP Readings from Last 1 Encounters:  01/06/22 117/80         Passed - Last Heart Rate in normal range    Pulse Readings from Last 1 Encounters:  01/06/22 (!) 58         Passed - Valid encounter within last 6 months    Recent Outpatient Visits          3 days ago Acute right hip pain   [facility], [place], MD   1 week ago Acute right hip pain   [place], [place], MD   7 months ago Annual physical exam   [facility] Jerrol Banana., MD   1 year ago Annual physical exam   [facility] Jerrol Banana., MD   2 years ago Benign essential HTN   [facility] Jerrol Banana., MD      Future Appointments            In 1 month Simmons-Robinson, Riki Sheer, MD [facility], [place]   In 5 months Jerrol Banana., MD [facility], [place]

## 2022-02-02 ENCOUNTER — Other Ambulatory Visit: Payer: Self-pay | Admitting: Family Medicine

## 2022-02-02 MED ORDER — MELOXICAM 7.5 MG PO TABS: 7.5000 mg | ORAL_TABLET | Freq: Every day | ORAL | 0 refills | Status: DC

## 2022-02-03 ENCOUNTER — Encounter: Payer: Self-pay | Admitting: Family Medicine

## 2022-02-03 ENCOUNTER — Ambulatory Visit (INDEPENDENT_AMBULATORY_CARE_PROVIDER_SITE_OTHER): Payer: PPO | Admitting: Family Medicine

## 2022-02-03 VITALS — BP 136/86 | HR 62 | Temp 98.3°F | Resp 16 | Ht 72.0 in | Wt 220.1 lb

## 2022-02-03 DIAGNOSIS — M25551 Pain in right hip: Secondary | ICD-10-CM

## 2022-02-03 NOTE — Assessment & Plan Note (Signed)
Pain intermittently improved with Meloxicam but now specifically tender over greater trochanter increasing suspicion for bursitis  As discussed at last visit, recommended sports medicine evaluation if pain increased or persisted  Patient recently picked up refill for meloxicam 7.'5mg'$  daily and continued activity as tolerated  He will follow up with me in 1 month for BMP  Referral submitted to [facility] today

## 2022-02-03 NOTE — Patient Instructions (Signed)
We have refilled your medication for the Meloxicam for another month.   Remember to avoid extra NSAIDS such as Advil, iburprofen or Aleeve   We have submitted a referral to sports medicine to evaluate the hip pain further   I am wondering if you may have a bursitis and they would be able to inject it for some pain relief.   Please follow up with me in 1 month and we will obtain labs at that time to assess kidney function.  Continue to do activities as tolerated and take it slow getting back to your normal walking distance   See you soon,   Dr. Quentin Cornwall

## 2022-02-03 NOTE — Progress Notes (Signed)
I,[name],acting as a scribe for [name], MD.,have documented all relevant documentation on the behalf of [name], MD,as directed by  [name], MD while in the presence of [name], MD.   Established patient visit   Patient: Justin Fox   DOB: September 13, 1960   61 y.o. Male  MRN: [id number] Visit Date: 02/03/2022  Today's healthcare provider: [name], MD   Chief Complaint  Patient presents with   Follow-Up Hip Pain   Subjective    HPI   Hip Pain Follow Up  Hip Pain: Patient complains of right hip pain.  Current symptoms include is aggravated by walking and is worse with activity . Associated symptoms: none. Aggravating symptoms: walking. Patient's symptoms overall are better with the Meloxicam. Previous visits for this problem: yes, last seen 1 month ago by the patient's PCP. Evaluation to date: plain films, which were normal.  Treatment to date:  meloxicam 7.'5mg'$  daily, gradual increase to normal activities as tolerated as well as using more supportive footwear .   Medications: Outpatient Medications Prior to Visit  Medication Sig   ascorbic acid (VITAMIN C) 100 MG tablet Take 100 mg by mouth daily. Reported on 08/11/2015   B Complex-C (SUPER B COMPLEX PO) Take 1 tablet by mouth daily.   carvedilol (COREG) 12.5 MG tablet TAKE 1 TABLET BY MOUTH TWICE DAILY WITH MEALS   fluticasone (FLONASE) 50 MCG/ACT nasal spray    Garlic 2979 MG CAPS Take 1 capsule by mouth daily.    lamoTRIgine (LAMICTAL) 25 MG tablet Take by mouth.   Melatonin 10 MG CAPS Take by mouth. Taking 15 mg   meloxicam (MOBIC) 7.5 MG tablet Take 1 tablet (7.5 mg total) by mouth daily.   Methylcellulose, Laxative, 500 MG TABS Take 1 tablet by mouth every morning.   methylphenidate (CONCERTA) 36 MG PO CR tablet Take 1 tablet (36 mg total) by mouth daily.   Misc Natural Products (GLUCOSAMINE CHONDROITIN TRIPLE) TABS Take 2  tablets by mouth daily.   Multiple Vitamin tablet Take 1 tablet by mouth daily.    Omega-3 Fatty Acids (FISH OIL ULTRA) 1400 MG CAPS Take 1 capsule by mouth daily.   QUEtiapine (SEROQUEL) 100 MG tablet Take 1 tablet by mouth at bedtime.   vitamin E 400 UNIT capsule Take 400 Units by mouth daily.   No facility-administered medications prior to visit.    Review of Systems     Objective    BP 136/86 (BP Location: Left Arm, Patient Position: Sitting, Cuff Size: Large)   Pulse 62   Temp 98.3 F (36.8 C) (Oral)   Resp 16   Ht 6' (1.829 m)   Wt 220 lb 1.6 oz (99.8 kg)   BMI 29.85 kg/m    Physical Exam  Hip:  - Inspection: No gross deformity, no swelling - Palpation:  TTP over right greater trochanter - ROM: Normal range of motion on Flexion, extension, abduction, internal and external rotation - Strength: Normal strength. 5/5 - Neuro/vasc: NV intact distally - Special Tests: Negative FABER and FADIR.   No results found for any visits on 02/03/22.  Assessment & Plan     Problem List Items Addressed This Visit       Other   Acute right hip pain - Primary    Pain intermittently improved with Meloxicam but now specifically tender over greater trochanter increasing suspicion for bursitis  As discussed at last visit, recommended sports medicine evaluation if pain increased or persisted  Patient recently picked up refill for meloxicam 7.'5mg'$  daily and continued activity as tolerated  He will follow up with me in 1 month for BMP  Referral submitted to [facility] today        Relevant Orders   Ambulatory referral to Sports Medicine     Return in about 1 month (around 03/05/2022) for hip pain .      I, [name], MD, have reviewed all documentation for this visit. The documentation on 02/03/22 for the exam, diagnosis, procedures, and orders are all accurate and complete.  Portions of this information were initially documented by the CMA and reviewed by me for  thoroughness and accuracy.      [name], MD  [facility] [phone] (phone) [phone] (fax)  [place]

## 2022-02-08 ENCOUNTER — Ambulatory Visit: Payer: PPO | Admitting: Family Medicine

## 2022-02-13 DIAGNOSIS — M7061 Trochanteric bursitis, right hip: Secondary | ICD-10-CM | POA: Diagnosis not present

## 2022-03-02 ENCOUNTER — Other Ambulatory Visit: Payer: Self-pay | Admitting: Physical Medicine & Rehabilitation

## 2022-03-02 DIAGNOSIS — S069X0S Unspecified intracranial injury without loss of consciousness, sequela: Secondary | ICD-10-CM

## 2022-03-03 MED ORDER — METHYLPHENIDATE HCL ER (OSM) 36 MG PO TBCR: 36.0000 mg | EXTENDED_RELEASE_TABLET | Freq: Every day | ORAL | 0 refills | Status: DC

## 2022-03-07 ENCOUNTER — Ambulatory Visit (INDEPENDENT_AMBULATORY_CARE_PROVIDER_SITE_OTHER): Payer: PPO | Admitting: Family Medicine

## 2022-03-07 ENCOUNTER — Encounter: Payer: Self-pay | Admitting: Family Medicine

## 2022-03-07 VITALS — BP 140/89 | HR 60 | Temp 98.4°F | Resp 16 | Wt 219.4 lb

## 2022-03-07 DIAGNOSIS — G8929 Other chronic pain: Secondary | ICD-10-CM | POA: Diagnosis not present

## 2022-03-07 DIAGNOSIS — M25551 Pain in right hip: Secondary | ICD-10-CM | POA: Diagnosis not present

## 2022-03-07 DIAGNOSIS — Z791 Long term (current) use of non-steroidal anti-inflammatories (NSAID): Secondary | ICD-10-CM

## 2022-03-07 NOTE — Progress Notes (Signed)
I,[name],acting as a scribe for [name], MD.,have documented all relevant documentation on the behalf of [name], MD,as directed by  [name], MD while in the presence of [name], MD.   Established patient visit   Patient: Justin Fox   DOB: 1960-11-06   61 y.o. Male  MRN: [id number] Visit Date: 03/07/2022  Today's healthcare provider: [name], MD   Chief Complaint  Patient presents with   Follow-up   Subjective    HPI  Follow up for acute hip pain  The patient was last seen for this 1 months ago. Changes made at last visit include continue meloxicam 7.61m daily and continued activity as tolerated  follow up in 1 month for BMP  Referral submitted to [facility].  Today patient reports that his hip pain is doing well.   Medications: Outpatient Medications Prior to Visit  Medication Sig   ascorbic acid (VITAMIN C) 100 MG tablet Take 100 mg by mouth daily. Reported on 08/11/2015   B Complex-C (SUPER B COMPLEX PO) Take 1 tablet by mouth daily.   carvedilol (COREG) 12.5 MG tablet TAKE 1 TABLET BY MOUTH TWICE DAILY WITH MEALS   fluticasone (FLONASE) 50 MCG/ACT nasal spray    Garlic 11660MG CAPS Take 1 capsule by mouth daily.    lamoTRIgine (LAMICTAL) 25 MG tablet Take by mouth.   Melatonin 10 MG CAPS Take by mouth. Taking 15 mg   meloxicam (MOBIC) 7.5 MG tablet Take 1 tablet (7.5 mg total) by mouth daily.   Methylcellulose, Laxative, 500 MG TABS Take 1 tablet by mouth every morning.   methylphenidate (CONCERTA) 36 MG PO CR tablet Take 1 tablet (36 mg total) by mouth daily.   Misc Natural Products (GLUCOSAMINE CHONDROITIN TRIPLE) TABS Take 2 tablets by mouth daily.   Multiple Vitamin tablet Take 1 tablet by mouth daily.    Omega-3 Fatty Acids (FISH OIL ULTRA) 1400 MG CAPS Take 1 capsule by mouth daily.   QUEtiapine (SEROQUEL) 100 MG tablet Take 1 tablet by mouth at bedtime.   vitamin E 400  UNIT capsule Take 400 Units by mouth daily.   No facility-administered medications prior to visit.    Review of Systems     Objective    BP (!) 140/89 (BP Location: Left Arm, Patient Position: Sitting, Cuff Size: Large)   Pulse 60   Temp 98.4 F (36.9 C) (Oral)   Resp 16   Wt 219 lb 6.4 oz (99.5 kg)   BMI 29.76 kg/m    Physical Exam Vitals reviewed.  Constitutional:      General: He is not in acute distress.    Appearance: Normal appearance. He is not ill-appearing, toxic-appearing or diaphoretic.  Eyes:     Conjunctiva/sclera: Conjunctivae normal.  Cardiovascular:     Rate and Rhythm: Normal rate and regular rhythm.     Pulses: Normal pulses.     Heart sounds: Normal heart sounds. No murmur heard.    No friction rub. No gallop.  Pulmonary:     Effort: Pulmonary effort is normal. No respiratory distress.     Breath sounds: Normal breath sounds. No stridor. No wheezing, rhonchi or rales.  Abdominal:     General: Bowel sounds are normal. There is no distension.     Palpations: Abdomen is soft.     Tenderness: There is no abdominal tenderness.  Musculoskeletal:     Right lower leg: No edema.     Left lower leg: No  edema.  Skin:    Findings: No erythema or rash.  Neurological:     Mental Status: He is alert and oriented to person, place, and time.     Results for orders placed or performed in visit on 16/10/96  Basic Metabolic Panel (BMET)  Result Value Ref Range   Glucose 96 70 - 99 mg/dL   BUN 15 8 - 27 mg/dL   Creatinine, Ser 1.16 0.76 - 1.27 mg/dL   eGFR 71 >59 mL/min/1.73   BUN/Creatinine Ratio 13 10 - 24   Sodium 140 134 - 144 mmol/L   Potassium 4.5 3.5 - 5.2 mmol/L   Chloride 101 96 - 106 mmol/L   CO2 24 20 - 29 mmol/L   Calcium 10.2 8.6 - 10.2 mg/dL     Assessment & Plan     Problem List Items Addressed This Visit       Other   Acute right hip pain    Hip pain becoming chronic in nature Overall improved He has completed course of  meloxicam He is enrolled in physical therapy  we will complete basic metabolic panel today to monitor creatinine Patient has been able to slowly and progressively return to his walking routine Denies tenderness on exam today Has improved range of motion       NSAID long-term use - Primary    BMP collected today for long-term meloxicam use      Relevant Orders   Basic Metabolic Panel (BMET) (Completed)     Return in about 3 months (around 06/06/2022).      I, [name], MD, have reviewed all documentation for this visit. Portions of this information were initially documented by the CMA and reviewed by me for thoroughness and accuracy.    [name], MD  [facility] [phone] (phone) [phone] (fax)  [place]

## 2022-03-08 LAB — BASIC METABOLIC PANEL
BUN/Creatinine Ratio: 13 (ref 10–24)
BUN: 15 mg/dL (ref 8–27)
CO2: 24 mmol/L (ref 20–29)
Calcium: 10.2 mg/dL (ref 8.6–10.2)
Chloride: 101 mmol/L (ref 96–106)
Creatinine, Ser: 1.16 mg/dL (ref 0.76–1.27)
Glucose: 96 mg/dL (ref 70–99)
Potassium: 4.5 mmol/L (ref 3.5–5.2)
Sodium: 140 mmol/L (ref 134–144)
eGFR: 71 mL/min/{1.73_m2} (ref >59)

## 2022-03-15 ENCOUNTER — Encounter: Payer: Self-pay | Admitting: Family Medicine

## 2022-03-15 DIAGNOSIS — M7061 Trochanteric bursitis, right hip: Secondary | ICD-10-CM | POA: Diagnosis not present

## 2022-03-15 DIAGNOSIS — Z791 Long term (current) use of non-steroidal anti-inflammatories (NSAID): Secondary | ICD-10-CM | POA: Insufficient documentation

## 2022-03-15 NOTE — Assessment & Plan Note (Signed)
BMP collected today for long-term meloxicam use

## 2022-03-15 NOTE — Assessment & Plan Note (Signed)
Hip pain becoming chronic in nature Overall improved He has completed course of meloxicam He is enrolled in physical therapy  we will complete basic metabolic panel today to monitor creatinine Patient has been able to slowly and progressively return to his walking routine Denies tenderness on exam today Has improved range of motion

## 2022-03-23 DIAGNOSIS — M7061 Trochanteric bursitis, right hip: Secondary | ICD-10-CM | POA: Diagnosis not present

## 2022-03-30 DIAGNOSIS — M7061 Trochanteric bursitis, right hip: Secondary | ICD-10-CM | POA: Diagnosis not present

## 2022-03-31 ENCOUNTER — Other Ambulatory Visit: Payer: Self-pay | Admitting: Physical Medicine & Rehabilitation

## 2022-03-31 DIAGNOSIS — F068 Other specified mental disorders due to known physiological condition: Secondary | ICD-10-CM

## 2022-04-04 ENCOUNTER — Other Ambulatory Visit: Payer: Self-pay | Admitting: Family Medicine

## 2022-04-05 ENCOUNTER — Other Ambulatory Visit: Payer: Self-pay

## 2022-04-05 DIAGNOSIS — F068 Other specified mental disorders due to known physiological condition: Secondary | ICD-10-CM

## 2022-04-05 MED ORDER — METHYLPHENIDATE HCL ER (OSM) 36 MG PO TBCR: 36.0000 mg | EXTENDED_RELEASE_TABLET | Freq: Every day | ORAL | 0 refills | Status: DC

## 2022-04-05 NOTE — Telephone Encounter (Signed)
Concerta 36 refill request:  Filled  Written  ID  Drug  QTY  Days  Prescriber  RX #  Dispenser  Refill  Daily Dose*  Pymt Type  PMP  03/03/2022 03/03/2022 2  Methylphenidate Er 36 Mg Tab 30.00 30 Za Swa [id number] [facility] ([id number]) 0/0  Private Pay [place] 02/03/2022 85/05/7739 2  Concerta Er 36 Mg Tablet 30.00 30 Za Swa [id number] [facility] ([id number]) 0/0  Medicare [place]

## 2022-04-06 ENCOUNTER — Telehealth: Payer: Self-pay | Admitting: Physical Medicine & Rehabilitation

## 2022-04-06 NOTE — Telephone Encounter (Signed)
Error

## 2022-04-07 MED ORDER — METHYLPHENIDATE HCL ER (OSM) 36 MG PO TBCR: 36.0000 mg | EXTENDED_RELEASE_TABLET | Freq: Every day | ORAL | 0 refills | Status: DC

## 2022-04-07 NOTE — Telephone Encounter (Signed)
Please let the [facility] know that if they want more than one rx for concerta at a time, they need to request through my chart or the office phone line. I can't fill 2 rx'es if it comes through the pharmacy request portal.

## 2022-04-13 DIAGNOSIS — M7061 Trochanteric bursitis, right hip: Secondary | ICD-10-CM | POA: Diagnosis not present

## 2022-04-19 DIAGNOSIS — M7061 Trochanteric bursitis, right hip: Secondary | ICD-10-CM | POA: Diagnosis not present

## 2022-04-25 DIAGNOSIS — F068 Other specified mental disorders due to known physiological condition: Secondary | ICD-10-CM | POA: Diagnosis not present

## 2022-04-25 DIAGNOSIS — S069XAS Unspecified intracranial injury with loss of consciousness status unknown, sequela: Secondary | ICD-10-CM | POA: Diagnosis not present

## 2022-04-25 DIAGNOSIS — F5105 Insomnia due to other mental disorder: Secondary | ICD-10-CM | POA: Diagnosis not present

## 2022-04-25 DIAGNOSIS — F409 Phobic anxiety disorder, unspecified: Secondary | ICD-10-CM | POA: Diagnosis not present

## 2022-04-25 DIAGNOSIS — S069X0S Unspecified intracranial injury without loss of consciousness, sequela: Secondary | ICD-10-CM | POA: Diagnosis not present

## 2022-04-25 DIAGNOSIS — Z79899 Other long term (current) drug therapy: Secondary | ICD-10-CM | POA: Diagnosis not present

## 2022-04-25 DIAGNOSIS — F064 Anxiety disorder due to known physiological condition: Secondary | ICD-10-CM | POA: Diagnosis not present

## 2022-04-25 DIAGNOSIS — R001 Bradycardia, unspecified: Secondary | ICD-10-CM | POA: Diagnosis not present

## 2022-04-27 DIAGNOSIS — M7061 Trochanteric bursitis, right hip: Secondary | ICD-10-CM | POA: Diagnosis not present

## 2022-05-11 DIAGNOSIS — M7061 Trochanteric bursitis, right hip: Secondary | ICD-10-CM | POA: Diagnosis not present

## 2022-05-29 ENCOUNTER — Telehealth: Payer: Self-pay | Admitting: Family Medicine

## 2022-05-29 NOTE — Telephone Encounter (Signed)
Contacted Justin Fox to reschedule their annual wellness visit. Appointment made for 08/07/2022.  [place] Direct Dial: [phone]

## 2022-06-07 DIAGNOSIS — Z0000 Encounter for general adult medical examination without abnormal findings: Secondary | ICD-10-CM | POA: Insufficient documentation

## 2022-06-07 NOTE — Progress Notes (Signed)
Annual Wellness Visit     Patient: Justin Fox, Male    DOB: 1960/08/18, 62 y.o.   MRN: [id number] Visit Date: 06/08/2022  Today's Provider: [name], MD   Chief Complaint  Patient presents with   Annual Exam   Subjective    Justin Fox is a 62 y.o. male who presents today for his Annual Wellness Visit.  He reports consuming a general diet.  Walks 15-20  He generally feels well. He reports sleeping well. He does not have additional problems to discuss today.   Neurologist is requesting labs for   HPI Health Maintenance Colonoscopy: 2017, hyperplastic polyps, recommended for repeat in 2027  Covid Vaccine:recommended for booster  Shingles Vax: discussed recommendation for this vaccine   Lung CA Screening CT: recommended based on patient smoking 1.5 PPD for 35 years, 52.5 pack years   Traumatic Brain Injury: stable, follows with Dr. Manuella Ghazi  Medications: Outpatient Medications Prior to Visit  Medication Sig   ascorbic acid (VITAMIN C) 100 MG tablet Take 100 mg by mouth daily. Reported on 08/11/2015   B Complex-C (SUPER B COMPLEX PO) Take 1 tablet by mouth daily.   carvedilol (COREG) 12.5 MG tablet TAKE 1 TABLET BY MOUTH TWICE DAILY WITH MEALS   fluticasone (FLONASE) 50 MCG/ACT nasal spray    Garlic [id number] MG CAPS Take 1 capsule by mouth daily.    lamoTRIgine (LAMICTAL) 25 MG tablet Take by mouth.   Melatonin 10 MG CAPS Take by mouth. Taking 15 mg   meloxicam (MOBIC) 7.5 MG tablet Take 1 tablet (7.5 mg total) by mouth daily.   Methylcellulose, Laxative, 500 MG TABS Take 1 tablet by mouth every morning.   methylphenidate (CONCERTA) 36 MG PO CR tablet Take 1 tablet (36 mg total) by mouth daily.   methylphenidate (CONCERTA) 36 MG PO CR tablet Take 1 tablet (36 mg total) by mouth daily.   methylphenidate (CONCERTA) 36 MG PO CR tablet Take 1 tablet (36 mg total) by mouth daily.   Misc Natural Products (GLUCOSAMINE CHONDROITIN TRIPLE) TABS Take 2 tablets  by mouth daily.   Multiple Vitamin tablet Take 1 tablet by mouth daily.    Omega-3 Fatty Acids (FISH OIL ULTRA) 1400 MG CAPS Take 1 capsule by mouth daily.   QUEtiapine (SEROQUEL) 100 MG tablet Take 1 tablet by mouth at bedtime.   vitamin E 400 UNIT capsule Take 400 Units by mouth daily.   No facility-administered medications prior to visit.    No Known Allergies  Patient Care Team: [name], MD as PCP - General (Family Medicine)  Review of Systems      Objective    Vitals: BP 120/77 (BP Location: Left Arm, Patient Position: Sitting, Cuff Size: Large)   Pulse (!) 50   Temp 97.8 F (36.6 C) (Oral)   Resp 16   Ht '6\' 1"'$  (1.854 m)   Wt 221 lb 8 oz (100.5 kg)   BMI 29.22 kg/m     Physical Exam Vitals reviewed.  Constitutional:      General: He is not in acute distress.    Appearance: Normal appearance. He is not ill-appearing, toxic-appearing or diaphoretic.  Eyes:     Conjunctiva/sclera: Conjunctivae normal.  Neck:     Thyroid: No thyroid mass, thyromegaly or thyroid tenderness.  Cardiovascular:     Rate and Rhythm: Normal rate and regular rhythm.     Pulses: Normal pulses.     Heart sounds: Normal heart sounds. No murmur heard.  No friction rub. No gallop.  Pulmonary:     Effort: Pulmonary effort is normal. No respiratory distress.     Breath sounds: Normal breath sounds. No stridor. No wheezing, rhonchi or rales.  Abdominal:     General: Bowel sounds are normal. There is no distension.     Palpations: Abdomen is soft.     Tenderness: There is no abdominal tenderness.  Musculoskeletal:     Right lower leg: No edema.     Left lower leg: No edema.  Lymphadenopathy:     Cervical: No cervical adenopathy.  Skin:    Findings: No erythema or rash.  Neurological:     Mental Status: He is alert and oriented to person, place, and time.      Most recent functional status assessment:    06/08/2022    9:16 AM  In your present state of health, do  you have any difficulty performing the following activities:  Hearing? 1  Vision? 0  Difficulty concentrating or making decisions? 0  Walking or climbing stairs? 0  Dressing or bathing? 0  Doing errands, shopping? 0   Most recent fall risk assessment:    06/08/2022    9:15 AM  Fall Risk   Falls in the past year? 0  Number falls in past yr: 0  Injury with Fall? 0  Risk for fall due to : No Fall Risks    Most recent depression screenings:    06/08/2022    9:15 AM 03/07/2022    3:34 PM  PHQ 2/9 Scores  PHQ - 2 Score 0 0  PHQ- 9 Score  0   Most recent cognitive screening:     No data to display         Most recent Audit-C alcohol use screening    06/08/2022    9:16 AM  Alcohol Use Disorder Test (AUDIT)  1. How often do you have a drink containing alcohol? 0  2. How many drinks containing alcohol do you have on a typical day when you are drinking? 0  3. How often do you have six or more drinks on one occasion? 0  AUDIT-C Score 0   A score of 3 or more in women, and 4 or more in men indicates increased risk for alcohol abuse, EXCEPT if all of the points are from question 1   Results for orders placed or performed in visit on 06/08/22  CBC w/Diff/Platelet  Result Value Ref Range   WBC 6.8 3.4 - 10.8 x10E3/uL   RBC 4.79 4.14 - 5.80 x10E6/uL   Hemoglobin 14.7 13.0 - 17.7 g/dL   Hematocrit 43.9 37.5 - 51.0 %   MCV 92 79 - 97 fL   MCH 30.7 26.6 - 33.0 pg   MCHC 33.5 31.5 - 35.7 g/dL   RDW 11.4 (L) 11.6 - 15.4 %   Platelets 250 150 - 450 x10E3/uL   Neutrophils 55 Not Estab. %   Lymphs 31 Not Estab. %   Monocytes 7 Not Estab. %   Eos 6 Not Estab. %   Basos 1 Not Estab. %   Neutrophils Absolute 3.7 1.4 - 7.0 x10E3/uL   Lymphocytes Absolute 2.1 0.7 - 3.1 x10E3/uL   Monocytes Absolute 0.5 0.1 - 0.9 x10E3/uL   EOS (ABSOLUTE) 0.4 0.0 - 0.4 x10E3/uL   Basophils Absolute 0.1 0.0 - 0.2 x10E3/uL   Immature Granulocytes 0 Not Estab. %   Immature Grans (Abs) 0.0 0.0 - 0.1  x10E3/uL  Lamotrigine level  Result Value Ref Range   Lamotrigine Lvl WILL FOLLOW   Hepatic function panel  Result Value Ref Range   Total Protein 6.8 6.0 - 8.5 g/dL   Albumin 4.7 3.9 - 4.9 g/dL   Bilirubin Total 0.4 0.0 - 1.2 mg/dL   Bilirubin, Direct <0.10 0.00 - 0.40 mg/dL   Alkaline Phosphatase 72 44 - 121 IU/L   AST 15 0 - 40 IU/L   ALT 19 0 - 44 IU/L  Lipid panel  Result Value Ref Range   Cholesterol, Total 174 100 - 199 mg/dL   Triglycerides 76 0 - 149 mg/dL   HDL 39 (L) >39 mg/dL   VLDL Cholesterol Cal 14 5 - 40 mg/dL   LDL Chol Calc (NIH) 121 (H) 0 - 99 mg/dL   Chol/HDL Ratio 4.5 0.0 - 5.0 ratio    Assessment & Plan       Immunization History  Administered Date(s) Administered   Influenza,inj,Quad PF,6+ Mos 04/05/2016, 11/26/2018   Moderna Sars-Covid-2 Vaccination 06/21/2019, 07/19/2019   Tdap 07/10/2016    Health Maintenance  Topic Date Due   Zoster Vaccines- Shingrix (1 of 2) Never done   Lung Cancer Screening  Never done   COVID-19 Vaccine (3 - Moderna risk series) 08/16/2019   INFLUENZA VACCINE  07/02/2022 (Originally 11/01/2021)   Medicare Annual Wellness (AWV)  06/08/2023   COLONOSCOPY (Pts 45-55yr Insurance coverage will need to be confirmed)  10/11/2025   DTaP/Tdap/Td (2 - Td or Tdap) 07/11/2026   Hepatitis C Screening  Completed   HIV Screening  Completed   HPV VACCINES  Aged Out    Problem List Items Addressed This Visit       Cardiovascular and Mediastinum   Benign essential HTN    Controlled BP at goal Continue coreg 12.'5mg'$  twice daily  No medications changes today  Hepatic function panel ordered today          Nervous and Auditory   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 SManuella Ghazi neuorology  Continue current meds including lamtrogine '25mg'$ , levels ordered today  Continue concerta and seroquel at current doses       Relevant Orders   CBC  w/Diff/Platelet (Completed)   Lamotrigine level (Completed)   Hepatic function panel (Completed)     Other   Encounter for annual wellness exam in Medicare patient - Primary    Annual wellness visit completed today including all of the following: -Reviewed patient's family medical history -Reviewed and updated patient's list of medical providers -Completed assessment of cognitive impairment -Completed assessment of patient's functional ability -Provided patient with recommendations for health screening services as well as vaccines -Health risk assessment completed and reviewed -Discussed recommendations for well-balanced diet in addition to continuing walks       High risk medication use    Requested by patient's neurologist Lamotrigine level  CBC        Relevant Orders   CBC w/Diff/Platelet (Completed)   Lamotrigine level (Completed)   Hepatic function panel (Completed)   Screening for lung cancer    Order placed for low dose lung CT screening  Hx of smoking for 20 years, 52 pack years       Relevant Orders   Ambulatory Referral Lung Cancer Screening [facility]   History of smoking greater than 50 pack years    Low dose CT of the chest ordered for lung  cancer screening      Relevant Orders   Ambulatory Referral Lung Cancer Screening [facility]   Annual physical exam    Age appropriate vaccines recommended  Labs ordered including hepatic function panel, lipids, CBC   Follow up in 1 year for annual physical  Age appropriate screenings ordered including low dose lung CT       Relevant Orders   Lipid panel     Return in about 4 months (around 10/08/2022).       The entirety of the information documented in the History of Present Illness, Review of Systems and Physical Exam were personally obtained by me. Portions of this information were initially documented by Lyndel Pleasure, CMA . I, [name], MD have reviewed the documentation  above for thoroughness and accuracy.      [name], MD  [facility] [phone] (phone) [phone] (fax)  [place]

## 2022-06-08 ENCOUNTER — Ambulatory Visit (INDEPENDENT_AMBULATORY_CARE_PROVIDER_SITE_OTHER): Payer: PPO | Admitting: Family Medicine

## 2022-06-08 ENCOUNTER — Encounter: Payer: Self-pay | Admitting: Family Medicine

## 2022-06-08 VITALS — BP 120/77 | HR 50 | Temp 97.8°F | Resp 16 | Ht 73.0 in | Wt 221.5 lb

## 2022-06-08 DIAGNOSIS — Z0000 Encounter for general adult medical examination without abnormal findings: Secondary | ICD-10-CM | POA: Insufficient documentation

## 2022-06-08 DIAGNOSIS — I10 Essential (primary) hypertension: Secondary | ICD-10-CM

## 2022-06-08 DIAGNOSIS — Z87891 Personal history of nicotine dependence: Secondary | ICD-10-CM | POA: Insufficient documentation

## 2022-06-08 DIAGNOSIS — S069X3S Unspecified intracranial injury with loss of consciousness of 1 hour to 5 hours 59 minutes, sequela: Secondary | ICD-10-CM

## 2022-06-08 DIAGNOSIS — Z79899 Other long term (current) drug therapy: Secondary | ICD-10-CM | POA: Insufficient documentation

## 2022-06-08 DIAGNOSIS — Z122 Encounter for screening for malignant neoplasm of respiratory organs: Secondary | ICD-10-CM | POA: Insufficient documentation

## 2022-06-08 NOTE — Patient Instructions (Addendum)
  Justin Fox Thank you for taking time to come for your Medicare Wellness Visit. I appreciate your ongoing commitment to your health goals.    Please start using the nasal spray (Flonase) with the technique we discussed (point outward when you delivery the two sprays for nostril)   We will follow up with you once the labs have resulted

## 2022-06-09 NOTE — Assessment & Plan Note (Addendum)
Controlled BP at goal Continue coreg 12.'5mg'$  twice daily  No medications changes today  Hepatic function panel ordered today

## 2022-06-09 NOTE — Assessment & Plan Note (Signed)
Annual wellness visit completed today including all of the following: -Reviewed patient's family medical history -Reviewed and updated patient's list of medical providers -Completed assessment of cognitive impairment -Completed assessment of patient's functional ability -Provided patient with recommendations for health screening services as well as vaccines -Health risk assessment completed and reviewed -Discussed recommendations for well-balanced diet in addition to continuing walks

## 2022-06-09 NOTE — Assessment & Plan Note (Signed)
Age appropriate vaccines recommended  Labs ordered including hepatic function panel, lipids, CBC   Follow up in 1 year for annual physical  Age appropriate screenings ordered including low dose lung CT

## 2022-06-09 NOTE — Assessment & Plan Note (Signed)
Chronic  Stable  Follows with Dr. Manuella Ghazi, neuorology  Continue current meds including lamtrogine '25mg'$ , levels ordered today  Continue concerta and seroquel at current doses

## 2022-06-09 NOTE — Assessment & Plan Note (Signed)
Requested by patient's neurologist Lamotrigine level  CBC

## 2022-06-09 NOTE — Assessment & Plan Note (Signed)
Low dose CT of the chest ordered for lung cancer screening

## 2022-06-09 NOTE — Assessment & Plan Note (Signed)
Order placed for low dose lung CT screening  Hx of smoking for 20 years, 52 pack years

## 2022-06-10 LAB — CBC WITH DIFFERENTIAL/PLATELET
Basophils Absolute: 0.1 10*3/uL (ref 0.0–0.2)
Basos: 1 %
EOS (ABSOLUTE): 0.4 10*3/uL (ref 0.0–0.4)
Eos: 6 %
Hematocrit: 43.9 % (ref 37.5–51.0)
Hemoglobin: 14.7 g/dL (ref 13.0–17.7)
Immature Grans (Abs): 0 10*3/uL (ref 0.0–0.1)
Immature Granulocytes: 0 %
Lymphocytes Absolute: 2.1 10*3/uL (ref 0.7–3.1)
Lymphs: 31 %
MCH: 30.7 pg (ref 26.6–33.0)
MCHC: 33.5 g/dL (ref 31.5–35.7)
MCV: 92 fL (ref 79–97)
Monocytes Absolute: 0.5 10*3/uL (ref 0.1–0.9)
Monocytes: 7 %
Neutrophils Absolute: 3.7 10*3/uL (ref 1.4–7.0)
Neutrophils: 55 %
Platelets: 250 10*3/uL (ref 150–450)
RBC: 4.79 x10E6/uL (ref 4.14–5.80)
RDW: 11.4 % — ABNORMAL LOW (ref 11.6–15.4)
WBC: 6.8 10*3/uL (ref 3.4–10.8)

## 2022-06-10 LAB — HEPATIC FUNCTION PANEL
ALT: 19 IU/L (ref 0–44)
AST: 15 IU/L (ref 0–40)
Albumin: 4.7 g/dL (ref 3.9–4.9)
Alkaline Phosphatase: 72 IU/L (ref 44–121)
Bilirubin Total: 0.4 mg/dL (ref 0.0–1.2)
Bilirubin, Direct: <0.1 mg/dL (ref 0.00–0.40)
Total Protein: 6.8 g/dL (ref 6.0–8.5)

## 2022-06-10 LAB — LIPID PANEL
Chol/HDL Ratio: 4.5 ratio (ref 0.0–5.0)
Cholesterol, Total: 174 mg/dL (ref 100–199)
HDL: 39 mg/dL — ABNORMAL LOW (ref >39)
LDL Chol Calc (NIH): 121 mg/dL — ABNORMAL HIGH (ref 0–99)
Triglycerides: 76 mg/dL (ref 0–149)
VLDL Cholesterol Cal: 14 mg/dL (ref 5–40)

## 2022-06-10 LAB — LAMOTRIGINE LEVEL: Lamotrigine Lvl: 4.1 ug/mL (ref 2.0–20.0)

## 2022-06-12 ENCOUNTER — Encounter: Payer: PPO | Admitting: Family Medicine

## 2022-06-21 ENCOUNTER — Encounter: Payer: Self-pay | Admitting: Physical Medicine & Rehabilitation

## 2022-06-21 ENCOUNTER — Encounter: Payer: PPO | Attending: Physical Medicine & Rehabilitation | Admitting: Physical Medicine & Rehabilitation

## 2022-06-21 VITALS — BP 131/76 | HR 65 | Ht 73.0 in | Wt 218.0 lb

## 2022-06-21 DIAGNOSIS — F068 Other specified mental disorders due to known physiological condition: Secondary | ICD-10-CM

## 2022-06-21 DIAGNOSIS — S069X3S Unspecified intracranial injury with loss of consciousness of 1 hour to 5 hours 59 minutes, sequela: Secondary | ICD-10-CM | POA: Diagnosis not present

## 2022-06-21 DIAGNOSIS — S069X0S Unspecified intracranial injury without loss of consciousness, sequela: Secondary | ICD-10-CM

## 2022-06-21 MED ORDER — METHYLPHENIDATE HCL ER (OSM) 36 MG PO TBCR: 36.0000 mg | EXTENDED_RELEASE_TABLET | Freq: Every day | ORAL | 0 refills | Status: DC

## 2022-06-21 NOTE — Patient Instructions (Addendum)
ALWAYS FEEL FREE TO CALL OUR OFFICE WITH ANY PROBLEMS OR QUESTIONS [id number])  **PLEASE NOTE** ALL MEDICATION REFILL REQUESTS (INCLUDING CONTROLLED SUBSTANCES) NEED TO BE MADE AT LEAST 7 DAYS PRIOR TO REFILL BEING DUE. ANY REFILL REQUESTS INSIDE THAT TIME FRAME MAY RESULT IN DELAYS IN RECEIVING YOUR PRESCRIPTION.    I WOULD LIKE YOU TO WORK ON SOCIALIZING A BIT MORE. GET ACCLIMATED!

## 2022-06-21 NOTE — Progress Notes (Signed)
Subjective:    Patient ID: Justin Fox, male    DOB: February 18, 1961, 62 y.o.   MRN: [id number]  HPI  Justin Fox is here in follow up of his TBI. [place]. He has felt more relaxed since moving into the area.   He is still a bit hesitant to be in crowds although he likes to talk with people.   He remains on concerta for his focus and initiation which works for him. And his wife is supportive as always. 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    Pain Inventory Average Pain 0 Pain Right Now 0 My pain is  NONE  LOCATION OF PAIN  NO PAIN - Here for Traumatic Brain Injury follow up  BOWEL Number of stools per week: 7 Oral laxative use  fiber pills   BLADDER Normal    Mobility ability to climb steps?  yes do you drive?  yes Do you have any goals in this area?  yes  Function disabled: date disabled 2018 Do you have any goals in this area?  yes  Neuro/Psych loss of taste or smell  Prior Studies Any changes since last visit?  no  Physicians involved in your care Any changes since last visit?  yes   Family History  Problem Relation Age of Onset   Anxiety disorder Mother    Thyroid disease Mother    Social History   Socioeconomic History   Marital status: Married    Spouse name: Not on file   Number of children: Not on file   Years of education: Not on file   Highest education level: Not on file  Occupational History   Not on file  Tobacco Use   Smoking status: Former    Packs/day: 1.50    Years: 35.00    Additional pack years: 0.00    Total pack years: 52.50    Types: Cigarettes    Quit date: 04/03/2008    Years since quitting: 14.2   Smokeless tobacco: Never   Tobacco comments:    Has been quit for 7-8 years  Vaping Use   Vaping Use: Never used  Substance and Sexual Activity   Alcohol use: No    Comment: Has had issues with this in the past. Has not drank in 7-8 years.   Drug  use: No   Sexual activity: Never  Other Topics Concern   Not on file  Social History Narrative   Not on file   Social Determinants of Health   Financial Resource Strain: Low Risk  (08/01/2021)   Overall Financial Resource Strain (CARDIA)    Difficulty of Paying Living Expenses: Not hard at all  Food Insecurity: No Food Insecurity (08/01/2021)   Hunger Vital Sign    Worried About Running Out of Food in the Last Year: Never true    [place] in the Last Year: Never true  Transportation Needs: No Transportation Needs (08/01/2021)   PRAPARE - Hydrologist (Medical): No    Lack of Transportation (Non-Medical): No  Physical Activity: Sufficiently Active (08/01/2021)   Exercise Vital Sign    Days of Exercise per Week: 4 days    Minutes of Exercise per Session: 40 min  Stress: No Stress Concern Present (08/01/2021)   [place]    Feeling of Stress : Not at all  Social Connections: Moderately  Isolated (08/01/2021)   Social Connection and Isolation Panel [NHANES]    Frequency of Communication with Friends and Family: Never    Frequency of Social Gatherings with Friends and Family: Once a week    Attends Religious Services: More than 4 times per year    Active Member of Clubs or Organizations: No    Attends Archivist Meetings: Never    Marital Status: Married   Past Surgical History:  Procedure Laterality Date   COLONOSCOPY WITH PROPOFOL N/A 10/12/2015   Procedure: COLONOSCOPY WITH PROPOFOL;  Surgeon: Lucilla Lame, MD;  Location: [facility] ENDOSCOPY;  Service: Endoscopy;  Laterality: N/A;   NO PAST SURGERIES     Past Medical History:  Diagnosis Date   Hypertension    There were no vitals taken for this visit.  Opioid Risk Score:   Fall Risk Score:  `1  Depression screen [facility] 2/9     06/08/2022    9:15 AM 03/07/2022    3:34 PM 12/30/2021    8:14 AM 12/28/2021    9:39 AM 08/01/2021     8:28 AM 06/22/2021    9:09 AM 06/06/2021   10:24 AM  Depression screen PHQ 2/9  Decreased Interest 0 0 1 0 0 0 0  Down, Depressed, Hopeless 0 0 0 0 0 0 0  PHQ - 2 Score 0 0 1 0 0 0 0  Altered sleeping  0 1  0  1  Tired, decreased energy 0 0 0  0  0  Change in appetite 0 0 0  0  0  Feeling bad or failure about yourself  0 0 0  0  0  Trouble concentrating 0 0 0  0  0  Moving slowly or fidgety/restless 0 0 0  0  0  Suicidal thoughts 0 0 0  0  0  PHQ-9 Score  0 2  0  1  Difficult doing work/chores Not difficult at all Not difficult at all Not difficult at all  Not difficult at all  Not difficult at all    Review of Systems  All other systems reviewed and are negative.      Objective:   Physical Exam  General: No acute distress HEENT: NCAT, EOMI, oral membranes moist Cards: reg rate  Chest: normal effort Abdomen: Soft, NT, ND Skin: dry, intact Extremities: no edema Psych: pleasant and appropriate  Neurological: oriented x 3. Alert. . Fairly focused. Improved insight and awareness. Motor: 5/5  All 4's. Balance stable. : normal Musc  full ROM , no pain.      Assessment & Plan:      1. 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            -continue compensatory strategies,             -sleep improved 2. Mood/behavior           - can continue lamictal for mood control.  Stable at present                     -concerta 36mg  daily. RF today           We will continue the controlled substance monitoring program, this consists of regular clinic visits, examinations, routine drug screening, pill counts as well as use of [place] Controlled Substance Reporting System. [place] today.    Medication was refilled and a second prescription was sent to the  patient's pharmacy for next month.              continue melatonin  for sleep aditionally           -continue seroquel at 100mg  qhs.               3. HTN- controlled              per PCP  4. Right rotator cuff syndrome/adhesive capsulitis:               -functional ROM--improved      Twenty minutes of face to face patient care time were spent during this visit. All questions were encouraged and answered.  Follow up with me in 6 mos.

## 2022-06-26 DIAGNOSIS — D2261 Melanocytic nevi of right upper limb, including shoulder: Secondary | ICD-10-CM | POA: Diagnosis not present

## 2022-06-26 DIAGNOSIS — D0461 Carcinoma in situ of skin of right upper limb, including shoulder: Secondary | ICD-10-CM | POA: Diagnosis not present

## 2022-06-26 DIAGNOSIS — L820 Inflamed seborrheic keratosis: Secondary | ICD-10-CM | POA: Diagnosis not present

## 2022-06-26 DIAGNOSIS — D2262 Melanocytic nevi of left upper limb, including shoulder: Secondary | ICD-10-CM | POA: Diagnosis not present

## 2022-06-26 DIAGNOSIS — D485 Neoplasm of uncertain behavior of skin: Secondary | ICD-10-CM | POA: Diagnosis not present

## 2022-06-26 DIAGNOSIS — Z85828 Personal history of other malignant neoplasm of skin: Secondary | ICD-10-CM | POA: Diagnosis not present

## 2022-06-26 DIAGNOSIS — D2272 Melanocytic nevi of left lower limb, including hip: Secondary | ICD-10-CM | POA: Diagnosis not present

## 2022-06-26 DIAGNOSIS — L570 Actinic keratosis: Secondary | ICD-10-CM | POA: Diagnosis not present

## 2022-06-26 DIAGNOSIS — C44519 Basal cell carcinoma of skin of other part of trunk: Secondary | ICD-10-CM | POA: Diagnosis not present

## 2022-06-29 ENCOUNTER — Other Ambulatory Visit: Payer: Self-pay | Admitting: Family Medicine

## 2022-06-29 NOTE — Telephone Encounter (Signed)
Requested Prescriptions  Pending Prescriptions Disp Refills   carvedilol (COREG) 12.5 MG tablet [Pharmacy Med Name: Carvedilol 12.5 MG Oral Tablet] 180 tablet 1    Sig: TAKE 1 TABLET BY MOUTH TWICE DAILY WITH MEALS     Cardiovascular: Beta Blockers 3 Passed - 06/29/2022  9:19 AM      Passed - Cr in normal range and within 360 days    Creatinine, Ser  Date Value Ref Range Status  03/07/2022 1.16 0.76 - 1.27 mg/dL Final         Passed - AST in normal range and within 360 days    AST  Date Value Ref Range Status  06/08/2022 15 0 - 40 IU/L Final         Passed - ALT in normal range and within 360 days    ALT  Date Value Ref Range Status  06/08/2022 19 0 - 44 IU/L Final         Passed - Last BP in normal range    BP Readings from Last 1 Encounters:  06/21/22 131/76         Passed - Last Heart Rate in normal range    Pulse Readings from Last 1 Encounters:  06/21/22 65         Passed - Valid encounter within last 6 months    Recent Outpatient Visits           3 weeks ago Encounter for annual wellness exam in Medicare patient   [place]-Robinson, [place], MD   3 months ago NSAID long-term use   [place], [place], MD   4 months ago Acute right hip pain   [place], [place], MD   5 months ago Acute right hip pain   [place], [place], MD   6 months ago Acute right hip pain   [place], [place], MD       Future Appointments             In 3 months Simmons-Robinson, Riki Sheer, MD [facility]

## 2022-07-10 ENCOUNTER — Ambulatory Visit: Payer: PPO | Admitting: Family Medicine

## 2022-07-10 DIAGNOSIS — C44519 Basal cell carcinoma of skin of other part of trunk: Secondary | ICD-10-CM | POA: Diagnosis not present

## 2022-07-10 DIAGNOSIS — D0461 Carcinoma in situ of skin of right upper limb, including shoulder: Secondary | ICD-10-CM | POA: Diagnosis not present

## 2022-08-07 ENCOUNTER — Ambulatory Visit (INDEPENDENT_AMBULATORY_CARE_PROVIDER_SITE_OTHER): Payer: PPO

## 2022-08-07 VITALS — BP 126/82 | Ht 73.0 in | Wt 216.0 lb

## 2022-08-07 DIAGNOSIS — Z0000 Encounter for general adult medical examination without abnormal findings: Secondary | ICD-10-CM | POA: Diagnosis not present

## 2022-08-07 NOTE — Patient Instructions (Signed)
Justin Fox , Thank you for taking time to come for your Medicare Wellness Visit. I appreciate your ongoing commitment to your health goals. Please review the following plan we discussed and let me know if I can assist you in the future.   These are the goals we discussed:  Goals      DIET - EAT MORE FRUITS AND VEGETABLES        This is a list of the screening recommended for you and due dates:  Health Maintenance  Topic Date Due   Zoster (Shingles) Vaccine (1 of 2) Never done   Screening for Lung Cancer  Never done   COVID-19 Vaccine (3 - Moderna risk series) 08/16/2019   Flu Shot  11/02/2022   Medicare Annual Wellness Visit  08/07/2023   Colon Cancer Screening  10/11/2025   DTaP/Tdap/Td vaccine (2 - Td or Tdap) 07/11/2026   Hepatitis C Screening: USPSTF Recommendation to screen - Ages 66-79 yo.  Completed   HIV Screening  Completed   HPV Vaccine  Aged Out    Advanced directives: yes  Conditions/risks identified: low falls risk  Next appointment: Follow up in one year for your annual wellness visit 08/08/2023 @10 :45am in person  Preventive Care 40-64 Years, Male Preventive care refers to lifestyle choices and visits with your health care provider that can promote health and wellness. What does preventive care include? A yearly physical exam. This is also called an annual well check. Dental exams once or twice a year. Routine eye exams. Ask your health care provider how often you should have your eyes checked. Personal lifestyle choices, including: Daily care of your teeth and gums. Regular physical activity. Eating a healthy diet. Avoiding tobacco and drug use. Limiting alcohol use. Practicing safe sex. Taking low-dose aspirin every day starting at age 55. What happens during an annual well check? The services and screenings done by your health care provider during your annual well check will depend on your age, overall health, lifestyle risk factors, and family history  of disease. Counseling  Your health care provider may ask you questions about your: Alcohol use. Tobacco use. Drug use. Emotional well-being. Home and relationship well-being. Sexual activity. Eating habits. Work and work Astronomer. Screening  You may have the following tests or measurements: Height, weight, and BMI. Blood pressure. Lipid and cholesterol levels. These may be checked every 5 years, or more frequently if you are over 41 years old. Skin check. Lung cancer screening. You may have this screening every year starting at age 40 if you have a 30-pack-year history of smoking and currently smoke or have quit within the past 15 years. Fecal occult blood test (FOBT) of the stool. You may have this test every year starting at age 22. Flexible sigmoidoscopy or colonoscopy. You may have a sigmoidoscopy every 5 years or a colonoscopy every 10 years starting at age 52. Prostate cancer screening. Recommendations will vary depending on your family history and other risks. Hepatitis C blood test. Hepatitis B blood test. Sexually transmitted disease (STD) testing. Diabetes screening. This is done by checking your blood sugar (glucose) after you have not eaten for a while (fasting). You may have this done every 1-3 years. Discuss your test results, treatment options, and if necessary, the need for more tests with your health care provider. Vaccines  Your health care provider may recommend certain vaccines, such as: Influenza vaccine. This is recommended every year. Tetanus, diphtheria, and acellular pertussis (Tdap, Td) vaccine. You may need a  Td booster every 10 years. Zoster vaccine. You may need this after age 56. Pneumococcal 13-valent conjugate (PCV13) vaccine. You may need this if you have certain conditions and have not been vaccinated. Pneumococcal polysaccharide (PPSV23) vaccine. You may need one or two doses if you smoke cigarettes or if you have certain conditions. Talk to  your health care provider about which screenings and vaccines you need and how often you need them. This information is not intended to replace advice given to you by your health care provider. Make sure you discuss any questions you have with your health care provider. Document Released: 04/16/2015 Document Revised: 12/08/2015 Document Reviewed: 01/19/2015 Elsevier Interactive Patient Education  2017 [employer].  Fall Prevention in the Home Falls can cause injuries. They can happen to people of all ages. There are many things you can do to make your home safe and to help prevent falls. What can I do on the outside of my home? Regularly fix the edges of walkways and driveways and fix any cracks. Remove anything that might make you trip as you walk through a door, such as a raised step or threshold. Trim any bushes or trees on the path to your home. Use bright outdoor lighting. Clear any walking paths of anything that might make someone trip, such as rocks or tools. Regularly check to see if handrails are loose or broken. Make sure that both sides of any steps have handrails. Any raised decks and porches should have guardrails on the edges. Have any leaves, snow, or ice cleared regularly. Use sand or salt on walking paths during winter. Clean up any spills in your garage right away. This includes oil or grease spills. What can I do in the bathroom? Use night lights. Install grab bars by the toilet and in the tub and shower. Do not use towel bars as grab bars. Use non-skid mats or decals in the tub or shower. If you need to sit down in the shower, use a plastic, non-slip stool. Keep the floor dry. Clean up any water that spills on the floor as soon as it happens. Remove soap buildup in the tub or shower regularly. Attach bath mats securely with double-sided non-slip rug tape. Do not have throw rugs and other things on the floor that can make you trip. What can I do in the bedroom? Use  night lights. Make sure that you have a light by your bed that is easy to reach. Do not use any sheets or blankets that are too big for your bed. They should not hang down onto the floor. Have a firm chair that has side arms. You can use this for support while you get dressed. Do not have throw rugs and other things on the floor that can make you trip. What can I do in the kitchen? Clean up any spills right away. Avoid walking on wet floors. Keep items that you use a lot in easy-to-reach places. If you need to reach something above you, use a strong step stool that has a grab bar. Keep electrical cords out of the way. Do not use floor polish or wax that makes floors slippery. If you must use wax, use non-skid floor wax. Do not have throw rugs and other things on the floor that can make you trip. What can I do with my stairs? Do not leave any items on the stairs. Make sure that there are handrails on both sides of the stairs and use them. Fix handrails  that are broken or loose. Make sure that handrails are as long as the stairways. Check any carpeting to make sure that it is firmly attached to the stairs. Fix any carpet that is loose or worn. Avoid having throw rugs at the top or bottom of the stairs. If you do have throw rugs, attach them to the floor with carpet tape. Make sure that you have a light switch at the top of the stairs and the bottom of the stairs. If you do not have them, ask someone to add them for you. What else can I do to help prevent falls? Wear shoes that: Do not have high heels. Have rubber bottoms. Are comfortable and fit you well. Are closed at the toe. Do not wear sandals. If you use a stepladder: Make sure that it is fully opened. Do not climb a closed stepladder. Make sure that both sides of the stepladder are locked into place. Ask someone to hold it for you, if possible. Clearly mark and make sure that you can see: Any grab bars or handrails. First and last  steps. Where the edge of each step is. Use tools that help you move around (mobility aids) if they are needed. These include: Canes. Walkers. Scooters. Crutches. Turn on the lights when you go into a dark area. Replace any light bulbs as soon as they burn out. Set up your furniture so you have a clear path. Avoid moving your furniture around. If any of your floors are uneven, fix them. If there are any pets around you, be aware of where they are. Review your medicines with your doctor. Some medicines can make you feel dizzy. This can increase your chance of falling. Ask your doctor what other things that you can do to help prevent falls. This information is not intended to replace advice given to you by your health care provider. Make sure you discuss any questions you have with your health care provider. Document Released: 01/14/2009 Document Revised: 08/26/2015 Document Reviewed: 04/24/2014 Elsevier Interactive Patient Education  2017 [employer].

## 2022-08-07 NOTE — Progress Notes (Signed)
Subjective:   Justin Fox is a 62 y.o. male who presents for Medicare Annual/Subsequent preventive examination.  Review of Systems    Cardiac Risk Factors include: advanced age (>30men, >28 women);hypertension;male gender    Objective:    Today's Vitals   08/07/22 1114  BP: 126/82  Weight: 216 lb (98 kg)  Height: 6\' 1"  (1.854 m)   Body mass index is 28.5 kg/m.     08/07/2022   11:23 AM 08/01/2021    8:32 AM 11/22/2016   10:57 AM 09/26/2016    1:05 PM 09/20/2016    9:21 AM 09/20/2016    9:18 AM 08/23/2016    1:10 PM  Advanced Directives  Does Patient Have a Medical Advance Directive? Yes No Yes Yes No No No  Type of [employer] [place];Living will      Copy of Healthcare Power of Attorney in Chart?   No - copy requested      Would patient like information on creating a medical advance directive?  No - Patient declined         Current Medications (verified) Outpatient Encounter Medications as of 08/07/2022  Medication Sig   ascorbic acid (VITAMIN C) 100 MG tablet Take 100 mg by mouth daily. Reported on 08/11/2015   B Complex-C (SUPER B COMPLEX PO) Take 1 tablet by mouth daily.   carvedilol (COREG) 12.5 MG tablet TAKE 1 TABLET BY MOUTH TWICE DAILY WITH MEALS   fluticasone (FLONASE) 50 MCG/ACT nasal spray    Garlic 1500 MG CAPS Take 1 capsule by mouth daily.    lamoTRIgine (LAMICTAL) 25 MG tablet Take by mouth.   Melatonin 10 MG CAPS Take by mouth. Taking 15 mg   meloxicam (MOBIC) 7.5 MG tablet Take 1 tablet (7.5 mg total) by mouth daily.   Methylcellulose, Laxative, 500 MG TABS Take 1 tablet by mouth every morning.   methylphenidate (CONCERTA) 36 MG PO CR tablet Take 1 tablet (36 mg total) by mouth daily.   methylphenidate (CONCERTA) 36 MG PO CR tablet Take 1 tablet (36 mg total) by mouth daily.   methylphenidate (CONCERTA) 36 MG PO CR tablet Take 1 tablet (36 mg total) by mouth daily.   Misc Natural Products (GLUCOSAMINE CHONDROITIN TRIPLE) TABS  Take 2 tablets by mouth daily.   Multiple Vitamin tablet Take 1 tablet by mouth daily.    naproxen (NAPROSYN) 500 MG tablet Take by mouth.   Omega-3 Fatty Acids (FISH OIL ULTRA) 1400 MG CAPS Take 1 capsule by mouth daily.   QUEtiapine (SEROQUEL) 100 MG tablet Take 1 tablet by mouth at bedtime.   vitamin E 400 UNIT capsule Take 400 Units by mouth daily.   No facility-administered encounter medications on file as of 08/07/2022.    Allergies (verified) Patient has no known allergies.   History: Past Medical History:  Diagnosis Date   Hypertension    Past Surgical History:  Procedure Laterality Date   COLONOSCOPY WITH PROPOFOL N/A 10/12/2015   Procedure: COLONOSCOPY WITH PROPOFOL;  Surgeon: Midge Minium, MD;  Location: [facility] ENDOSCOPY;  Service: Endoscopy;  Laterality: N/A;   NO PAST SURGERIES     Family History  Problem Relation Age of Onset   Anxiety disorder Mother    Thyroid disease Mother    Social History   Socioeconomic History   Marital status: Married    Spouse name: Not on file   Number of children: Not on file   Years of education: Not on file  Highest education level: Not on file  Occupational History   Not on file  Tobacco Use   Smoking status: Former    Packs/day: 1.50    Years: 35.00    Additional pack years: 0.00    Total pack years: 52.50    Types: Cigarettes    Quit date: 04/03/2008    Years since quitting: 14.3   Smokeless tobacco: Never   Tobacco comments:    Has been quit for 7-8 years  Vaping Use   Vaping Use: Never used  Substance and Sexual Activity   Alcohol use: No    Comment: Has had issues with this in the past. Has not drank in 7-8 years.   Drug use: No   Sexual activity: Never  Other Topics Concern   Not on file  Social History Narrative   Not on file   Social Determinants of Health   Financial Resource Strain: Low Risk  (08/07/2022)   Overall Financial Resource Strain (CARDIA)    Difficulty of Paying Living Expenses: Not hard at  all  Food Insecurity: No Food Insecurity (08/07/2022)   Hunger Vital Sign    Worried About Running Out of Food in the Last Year: Never true    Ran Out of Food in the Last Year: Never true  Transportation Needs: No Transportation Needs (08/07/2022)   PRAPARE - Administrator, Civil Service (Medical): No    Lack of Transportation (Non-Medical): No  Physical Activity: Sufficiently Active (08/07/2022)   Exercise Vital Sign    Days of Exercise per Week: 4 days    Minutes of Exercise per Session: 40 min  Stress: No Stress Concern Present (08/07/2022)   [employer] of Occupational Health - Occupational Stress Questionnaire    Feeling of Stress : Only a little  Social Connections: Moderately Integrated (08/07/2022)   Social Connection and Isolation Panel [NHANES]    Frequency of Communication with Friends and Family: Twice a week    Frequency of Social Gatherings with Friends and Family: Once a week    Attends Religious Services: More than 4 times per year    Active Member of [employer] or Organizations: No    Attends Engineer, structural: Never    Marital Status: Married    Tobacco Counseling Counseling given: Not Answered Tobacco comments: Has been quit for 7-8 years   Clinical Intake:  Pre-visit preparation completed: Yes  Pain : No/denies pain     BMI - recorded: 28.5 Nutritional Status: BMI 25 -29 Overweight Nutritional Risks: None Diabetes: No  How often do you need to have someone help you when you read instructions, pamphlets, or other written materials from your doctor or pharmacy?: 1 - Never  Diabetic?no  Interpreter Needed?: No  Comments: lives with wife Information entered by :: B.Eula Jaster,LPN   Activities of Daily Living    08/07/2022   11:23 AM 06/08/2022    9:16 AM  In your present state of health, do you have any difficulty performing the following activities:  Hearing? 1 1  Vision? 0 0  Difficulty concentrating or making decisions? 1 0   Comment memory problems   Walking or climbing stairs?  0  Dressing or bathing? 0 0  Doing errands, shopping? 0 0  Preparing Food and eating ? N   Using the Toilet? N   In the past six months, have you accidently leaked urine? N   Do you have problems with loss of bowel control? N   Managing your  Medications? N   Managing your Finances? N   Housekeeping or managing your Housekeeping? Y   Comment wife doed     Patient Care Team: Ronnald Ramp, MD as PCP - General (Family Medicine)  Indicate any recent Medical Services you may have received from other than [facility] providers in the past year (date may be approximate).     Assessment:   This is a routine wellness examination for [facility].  Hearing/Vision screen Hearing Screening - Comments:: Adequate hearing with hearing aides Vision Screening - Comments:: Adequate vision (no peripheral vision 7 yrs ago) [facility]  Dietary issues and exercise activities discussed: Current Exercise Habits: Home exercise routine (over 20,000 steps daily), Type of exercise: walking, Time (Minutes): > 60, Frequency (Times/Week): 5, Weekly Exercise (Minutes/Week): 0, Intensity: Mild, Exercise limited by: neurologic condition(s);orthopedic condition(s)   Goals Addressed             This Visit's Progress    DIET - EAT MORE FRUITS AND VEGETABLES   On track      Depression Screen    08/07/2022   11:19 AM 06/21/2022    9:25 AM 06/08/2022    9:15 AM 03/07/2022    3:34 PM 12/30/2021    8:14 AM 12/28/2021    9:39 AM 08/01/2021    8:28 AM  PHQ 2/9 Scores  PHQ - 2 Score 0 0 0 0 1 0 0  PHQ- 9 Score    0 2  0    Fall Risk    08/07/2022   11:17 AM 06/21/2022    9:25 AM 06/08/2022    9:15 AM 03/07/2022    3:34 PM 12/30/2021    8:14 AM  Fall Risk   Falls in the past year? 0 0 0 0 0  Number falls in past yr: 0 0 0 0 0  Injury with Fall? 0 0 0 0 0  Risk for fall due to : No Fall Risks  No Fall Risks No Fall Risks No Fall Risks  Follow up  Education provided;Falls prevention discussed        FALL RISK PREVENTION PERTAINING TO THE HOME:  Any stairs in or around the home? Yes  If so, are there any without handrails? Yes  Home free of loose throw rugs in walkways, pet beds, electrical cords, etc? Yes  Adequate lighting in your home to reduce risk of falls? Yes   ASSISTIVE DEVICES UTILIZED TO PREVENT FALLS:  Life alert? No  Use of a cane, walker or w/c? No  Grab bars in the bathroom? Yes  Shower chair or bench in shower? Yes  Elevated toilet seat or a handicapped toilet? Yes   TIMED UP AND GO:  Was the test performed? Yes .  Length of time to ambulate 10 feet: 10 sec.   Gait slow and steady without use of assistive device  Cognitive Function:        08/07/2022   11:32 AM  6CIT Screen  What Year? 0 points  What month? 0 points  What time? 0 points  Count back from 20 0 points  Months in reverse 0 points  Repeat phrase 0 points  Total Score 0 points    Immunizations Immunization History  Administered Date(s) Administered   Influenza,inj,Quad PF,6+ Mos 04/05/2016, 11/26/2018   Moderna Sars-Covid-2 Vaccination 06/21/2019, 07/19/2019   Tdap 07/10/2016    TDAP status: Up to date  Flu Vaccine status: Declined, Education has been provided regarding the importance of this vaccine but patient still  declined. Advised may receive this vaccine at local pharmacy or Health Dept. Aware to provide a copy of the vaccination record if obtained from local pharmacy or Health Dept. Verbalized acceptance and understanding.  Pneumococcal vaccine status: Declined,  Education has been provided regarding the importance of this vaccine but patient still declined. Advised may receive this vaccine at local pharmacy or Health Dept. Aware to provide a copy of the vaccination record if obtained from local pharmacy or Health Dept. Verbalized acceptance and understanding.   Covid-19 vaccine status: Completed vaccines  Qualifies for  Shingles Vaccine? Yes   Zostavax completed No   Shingrix Completed?: No.    Education has been provided regarding the importance of this vaccine. Patient has been advised to call insurance company to determine out of pocket expense if they have not yet received this vaccine. Advised may also receive vaccine at local pharmacy or Health Dept. Verbalized acceptance and understanding.  Screening Tests Health Maintenance  Topic Date Due   Zoster Vaccines- Shingrix (1 of 2) Never done   Lung Cancer Screening  Never done   COVID-19 Vaccine (3 - Moderna risk series) 08/16/2019   INFLUENZA VACCINE  11/02/2022   Medicare Annual Wellness (AWV)  08/07/2023   COLONOSCOPY (Pts 45-81yrs Insurance coverage will need to be confirmed)  10/11/2025   DTaP/Tdap/Td (2 - Td or Tdap) 07/11/2026   Hepatitis C Screening  Completed   HIV Screening  Completed   HPV VACCINES  Aged Out    Health Maintenance  Health Maintenance Due  Topic Date Due   Zoster Vaccines- Shingrix (1 of 2) Never done   Lung Cancer Screening  Never done   COVID-19 Vaccine (3 - Moderna risk series) 08/16/2019    Colorectal cancer screening: Type of screening: Colonoscopy. Completed yes. Repeat every 10 years  Lung Cancer Screening: (Low Dose CT Chest recommended if Age 85-80 years, 30 pack-year currently smoking OR have quit w/in 15years.) does not qualify.   Lung Cancer Screening Referral: no  Additional Screening:  Hepatitis C Screening: does not qualify; Completed yes  Vision Screening: Recommended annual ophthalmology exams for early detection of glaucoma and other disorders of the eye. Is the patient up to date with their annual eye exam?  Yes  Who is the provider or what is the name of the office in which the patient attends annual eye exams? [facility] If pt is not established with a provider, would they like to be referred to a provider to establish care? No .   Dental Screening: Recommended annual dental exams for  proper oral hygiene  Community Resource Referral / Chronic Care Management: CRR required this visit?  No   CCM required this visit?  No      Plan:     I have personally reviewed and noted the following in the patient's chart:   Medical and social history Use of alcohol, tobacco or illicit drugs  Current medications and supplements including opioid prescriptions. Patient is not currently taking opioid prescriptions. Functional ability and status Nutritional status Physical activity Advanced directives List of other physicians Hospitalizations, surgeries, and ER visits in previous 12 months Vitals Screenings to include cognitive, depression, and falls Referrals and appointments  In addition, I have reviewed and discussed with patient certain preventive protocols, quality metrics, and best practice recommendations. A written personalized care plan for preventive services as well as general preventive health recommendations were provided to patient.     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 He reports no concerns or questions during the visit.

## 2022-08-08 ENCOUNTER — Other Ambulatory Visit: Payer: Self-pay

## 2022-08-08 DIAGNOSIS — Z87891 Personal history of nicotine dependence: Secondary | ICD-10-CM

## 2022-08-08 DIAGNOSIS — Z122 Encounter for screening for malignant neoplasm of respiratory organs: Secondary | ICD-10-CM

## 2022-08-23 ENCOUNTER — Other Ambulatory Visit: Payer: Self-pay | Admitting: Physical Medicine & Rehabilitation

## 2022-08-23 DIAGNOSIS — S069X0S Unspecified intracranial injury without loss of consciousness, sequela: Secondary | ICD-10-CM

## 2022-08-24 MED ORDER — METHYLPHENIDATE HCL ER (OSM) 36 MG PO TBCR: 36.0000 mg | EXTENDED_RELEASE_TABLET | Freq: Every day | ORAL | 0 refills | Status: DC

## 2022-08-25 ENCOUNTER — Encounter: Payer: Self-pay | Admitting: Primary Care

## 2022-08-25 ENCOUNTER — Ambulatory Visit (INDEPENDENT_AMBULATORY_CARE_PROVIDER_SITE_OTHER): Payer: PPO | Admitting: Primary Care

## 2022-08-25 DIAGNOSIS — Z87891 Personal history of nicotine dependence: Secondary | ICD-10-CM

## 2022-08-25 NOTE — Progress Notes (Signed)
Virtual Visit via Telephone Note  I connected with Justin Fox on 08/25/22 at  4:00 PM EDT by telephone and verified that I am speaking with the correct person using two identifiers.  Location: Patient: Home Provider: Offcice    I discussed the limitations, risks, security and privacy concerns of performing an evaluation and management service by telephone and the availability of in person appointments. I also discussed with the patient that there may be a patient responsible charge related to this service. The patient expressed understanding and agreed to proceed.    Shared Decision Making Visit Lung Cancer Screening Program [phone])   Eligibility: Age 62 y.o. Pack Years Smoking History Calculation 56 (# packs/per year x # years smoked) Recent History of coughing up blood  no Unexplained weight loss? no ( >Than 15 pounds within the last 6 months ) Prior History Lung / other cancer no (Diagnosis within the last 5 years already requiring surveillance chest CT Scans). Smoking Status Former Smoker Former Smokers: Years since quit: 13 years  Quit Date: 2011  Visit Components: Discussion included one or more decision making aids. yes Discussion included risk/benefits of screening. yes Discussion included potential follow up diagnostic testing for abnormal scans. yes Discussion included meaning and risk of over diagnosis. yes Discussion included meaning and risk of False Positives. yes Discussion included meaning of total radiation exposure. yes  Counseling Included: Importance of adherence to annual lung cancer LDCT screening. yes Impact of comorbidities on ability to participate in the program. yes Ability and willingness to under diagnostic treatment. yes  Smoking Cessation Counseling: Current Smokers:  Discussed importance of smoking cessation. yes Information about tobacco cessation classes and interventions provided to patient. yes Patient provided with "ticket"  for LDCT Scan. NA Symptomatic Patient. no  Counseling(Intermediate counseling: > three minutes) 99406 Diagnosis Code: Tobacco Use Z72.0 Asymptomatic Patient yes  Counseling (Intermediate counseling: > three minutes counseling) U0454 Former Smokers:  Discussed the importance of maintaining cigarette abstinence. yes Diagnosis Code: Personal History of Nicotine Dependence. U98.119 Information about tobacco cessation classes and interventions provided to patient. Yes Patient provided with "ticket" for LDCT Scan. NA Written Order for Lung Cancer Screening with LDCT placed in Epic. Yes (CT Chest Lung Cancer Screening Low Dose W/O CM) [id number] Z12.2-Screening of respiratory organs Z87.891-Personal history of nicotine dependence  I have spent 25 minutes of face to face/ virtual visit   time with Justin Fox and Justin Fox discussing the risks and benefits of lung cancer screening. We viewed / discussed a power point together that explained in detail the above noted topics. We paused at intervals to allow for questions to be asked and answered to ensure understanding.We discussed that the single most powerful action that he can take to decrease his risk of developing lung cancer is to quit smoking. We discussed whether or not he is ready to commit to setting a quit date. We discussed options for tools to aid in quitting smoking including nicotine replacement therapy, non-nicotine medications, support groups, Quit Smart classes, and behavior modification. We discussed that often times setting smaller, more achievable goals, such as eliminating 1 cigarette a day for a week and then 2 cigarettes a day for a week can be helpful in slowly decreasing the number of cigarettes smoked. This allows for a sense of accomplishment as well as providing a clinical benefit. I provided him  with smoking cessation  information  with contact information for community resources, classes, free nicotine replacement therapy, and  access to  mobile apps, text messaging, and on-line smoking cessation help. I have also provided him  the office contact information in the event he needs to contact me, or the screening staff. We discussed the time and location of the scan, and that either Justin Miyamoto RN, Justin Lemon, RN  or I will call / send a letter with the results within 24-72 hours of receiving them. The patient verbalized understanding of all of  the above and had no further questions upon leaving the office. They have my contact information in the event they have any further questions.  I spent 3-5 minutes counseling on smoking cessation and the health risks of continued tobacco abuse.  I explained to the patient that there has been a high incidence of coronary artery disease noted on these exams. I explained that this is a non-gated exam therefore degree or severity cannot be determined. This patient is not on statin therapy. I have asked the patient to follow-up with their PCP regarding any incidental finding of coronary artery disease and management with diet or medication as their PCP  feels is clinically indicated. The patient verbalized understanding of the above and had no further questions upon completion of the visit.  Patient has hx traumatic brain injury, he is able to participate to shared decision but has difficulty retaining information. Speak with patient's wife, Justin Fox, if needed.   [name], NP

## 2022-08-25 NOTE — Patient Instructions (Signed)
Thank you for participating in the [facility] Cancer Screening Program. It was our pleasure to meet you today. We will call you with the results of your scan within the next few days. Your scan will be assigned a Lung RADS category score by the physicians reading the scans.  This Lung RADS score determines follow up scanning.  See below for description of categories, and follow up screening recommendations. We will be in touch to schedule your follow up screening annually or based on recommendations of our providers. We will fax a copy of your scan results to your Primary Care Physician, or the physician who referred you to the program, to ensure they have the results. Please call the office if you have any questions or concerns regarding your scanning experience or results.  Our office number is [phone]. Please speak with Denise Phelps, RN. , or  Denise Buckner RN, They are  our Lung Cancer Screening RN.'s If They are unavailable when you call, Please leave a message on the voice mail. We will return your call at our earliest convenience.This voice mail is monitored several times a day.  Remember, if your scan is normal, we will scan you annually as long as you continue to meet the criteria for the program. (Age 50-80, Current smoker or smoker who has quit within the last 15 years). If you are a smoker, remember, quitting is the single most powerful action that you can take to decrease your risk of lung cancer and other pulmonary, breathing related problems. We know quitting is hard, and we are here to help.  Please let us know if there is anything we can do to help you meet your goal of quitting. If you are a former smoker, congratulations. We are proud of you! Remain smoke free! Remember you can refer friends or family members through the number above.  We will screen them to make sure they meet criteria for the program. Thank you for helping us take better care of you by  participating in Lung Screening.  You can receive free nicotine replacement therapy ( patches, gum or mints) by calling 1-800-QUIT NOW. Please call so we can get you on the path to becoming  a non-smoker. I know it is hard, but you can do this!  Lung RADS Categories:  Lung RADS 1: no nodules or definitely non-concerning nodules.  Recommendation is for a repeat annual scan in 12 months.  Lung RADS 2:  nodules that are non-concerning in appearance and behavior with a very low likelihood of becoming an active cancer. Recommendation is for a repeat annual scan in 12 months.  Lung RADS 3: nodules that are probably non-concerning , includes nodules with a low likelihood of becoming an active cancer.  Recommendation is for a 6-month repeat screening scan. Often noted after an upper respiratory illness. We will be in touch to make sure you have no questions, and to schedule your 6-month scan.  Lung RADS 4 A: nodules with concerning findings, recommendation is most often for a follow up scan in 3 months or additional testing based on our provider's assessment of the scan. We will be in touch to make sure you have no questions and to schedule the recommended 3 month follow up scan.  Lung RADS 4 B:  indicates findings that are concerning. We will be in touch with you to schedule additional diagnostic testing based on our provider's  assessment of the scan.  Other options for assistance in smoking cessation (   As covered by your insurance benefits)  Hypnosis for smoking cessation  [facility] [phone]  Acupuncture for smoking cessation  [facility] [phone]   

## 2022-08-29 ENCOUNTER — Ambulatory Visit
Admission: RE | Admit: 2022-08-29 | Discharge: 2022-08-29 | Disposition: A | Discharge status: Home / Self Care | Payer: PPO | Source: Ambulatory Visit | Attending: Acute Care | Admitting: Acute Care

## 2022-08-29 DIAGNOSIS — Z122 Encounter for screening for malignant neoplasm of respiratory organs: Secondary | ICD-10-CM | POA: Insufficient documentation

## 2022-08-29 DIAGNOSIS — Z87891 Personal history of nicotine dependence: Secondary | ICD-10-CM | POA: Diagnosis not present

## 2022-09-04 ENCOUNTER — Telehealth: Payer: Self-pay | Admitting: Acute Care

## 2022-09-04 DIAGNOSIS — Z122 Encounter for screening for malignant neoplasm of respiratory organs: Secondary | ICD-10-CM

## 2022-09-04 DIAGNOSIS — Z87891 Personal history of nicotine dependence: Secondary | ICD-10-CM

## 2022-09-04 NOTE — Telephone Encounter (Signed)
Called and spoke to pt's wife, Noell (on [place]). Informed her of the results of the LDCT and the recommendations to have the repeat scan in 6 months. Order placed. Results have been sent to PCP with plan in the notes. Pt's wife verbalized understanding and denied any further questions or concerns at this time.

## 2022-09-25 ENCOUNTER — Other Ambulatory Visit: Payer: Self-pay | Admitting: Physical Medicine & Rehabilitation

## 2022-09-25 DIAGNOSIS — F068 Other specified mental disorders due to known physiological condition: Secondary | ICD-10-CM

## 2022-09-25 MED ORDER — METHYLPHENIDATE HCL ER (OSM) 36 MG PO TBCR
36.0000 mg | EXTENDED_RELEASE_TABLET | Freq: Every day | ORAL | 0 refills | Status: DC
Start: 2022-09-25 — End: 2022-11-17

## 2022-09-25 MED ORDER — METHYLPHENIDATE HCL ER (OSM) 36 MG PO TBCR: 36.0000 mg | EXTENDED_RELEASE_TABLET | Freq: Every day | ORAL | 0 refills | Status: DC

## 2022-10-06 DIAGNOSIS — H468 Other optic neuritis: Secondary | ICD-10-CM | POA: Diagnosis not present

## 2022-10-09 ENCOUNTER — Encounter: Payer: Self-pay | Admitting: Family Medicine

## 2022-10-09 ENCOUNTER — Ambulatory Visit (INDEPENDENT_AMBULATORY_CARE_PROVIDER_SITE_OTHER): Payer: PPO | Admitting: Family Medicine

## 2022-10-09 VITALS — BP 125/75 | HR 49 | Resp 13 | Ht 73.0 in | Wt 210.5 lb

## 2022-10-09 DIAGNOSIS — I10 Essential (primary) hypertension: Secondary | ICD-10-CM

## 2022-10-09 DIAGNOSIS — E7800 Pure hypercholesterolemia, unspecified: Secondary | ICD-10-CM | POA: Diagnosis not present

## 2022-10-09 MED ORDER — CARVEDILOL 12.5 MG PO TABS: ORAL_TABLET | ORAL | 1 refills | Status: DC

## 2022-10-09 NOTE — Patient Instructions (Signed)
Please start taking the Coreg 12.5mg  once daily for the next 2 weeks and then start taking 12.5mg  every other day for two weeks until you see me in 1 month   We will work on tapering off of the Coreg since your heart rate was low

## 2022-10-09 NOTE — Progress Notes (Addendum)
I,Vanessa  Vital,acting as a Neurosurgeon for [employer], MD.,have documented all relevant documentation on the behalf of Ronnald Ramp, MD,as directed by  Ronnald Ramp, MD while in the presence of Ronnald Ramp, MD.  Established patient visit   Patient: Justin Fox   DOB: 1961/01/20   62 y.o. Male  MRN: [id number] Visit Date: 10/09/2022  Today's healthcare provider: Ronnald Ramp, MD   Chief Complaint  Patient presents with   Hypertension   Hyperlipidemia   Subjective      Hypertension, follow-up  BP Readings from Last 3 Encounters:  10/09/22 125/75  08/07/22 126/82  06/21/22 131/76   Wt Readings from Last 3 Encounters:  10/09/22 210 lb 8 oz (95.5 kg)  08/07/22 216 lb (98 kg)  06/21/22 218 lb (98.9 kg)     He was last seen for hypertension 4 months ago.  BP at that visit was 126/82. Management since that visit includes no changes.  He reports excellent compliance with treatment. He is not having side effects.   He does not smoke.  Use of agents associated with hypertension: none and concerta .    Symptoms: No chest pain No chest pressure  No palpitations No syncope  No dyspnea No orthopnea  No paroxysmal nocturnal dyspnea No lower extremity edema   Pertinent labs Lab Results  Component Value Date   CHOL 174 06/08/2022   HDL 39 (L) 06/08/2022   LDLCALC 121 (H) 06/08/2022   TRIG 76 06/08/2022   CHOLHDL 4.5 06/08/2022   Lab Results  Component Value Date   NA 140 03/07/2022   K 4.5 03/07/2022   CREATININE 1.16 03/07/2022   EGFR 71 03/07/2022   GLUCOSE 96 03/07/2022   TSH 1.450 06/06/2021     The 10-year ASCVD risk score (Arnett DK, et al., 2019) is: 17.6%  ---------------------------------------------------------------------------------------------------  Lipid/Cholesterol, Follow-up  Last lipid panel Other pertinent labs  Lab Results  Component Value Date   CHOL 174 06/08/2022   HDL 39  (L) 06/08/2022   LDLCALC 121 (H) 06/08/2022   TRIG 76 06/08/2022   CHOLHDL 4.5 06/08/2022   Lab Results  Component Value Date   ALT 19 06/08/2022   AST 15 06/08/2022   PLT 250 06/08/2022   TSH 1.450 06/06/2021     He was last seen for this 4 months ago.  Management since that visit includes no changes.  He reports excellent compliance with treatment. He is not having side effects.   Symptoms: No chest pain No chest pressure/discomfort  No dyspnea No lower extremity edema  No numbness or tingling of extremity No orthopnea  No palpitations No paroxysmal nocturnal dyspnea  No speech difficulty No syncope   Current diet: well balanced Current exercise: no regular exercise  The 10-year ASCVD risk score (Arnett DK, et al., 2019) is: 17.6%  ---------------------------------------------------------------------------------------------------   Discussed the use of AI scribe software for clinical note transcription with the patient, who gave verbal consent to proceed.  History of Present Illness   The patient, with a history of Traumatic Brain Injury (TBI), has recently moved to a new location, which he reports has had a positive impact on his overall wellbeing. He describes the new environment as quiet and peaceful, which has helped him manage his pain. The patient has been experiencing pain, the nature of which he finds difficult to describe, but it is significant enough to cause distress. He also reports having sleep issues throughout his life, which have persisted despite medication. However, he  notes that he is now able to sleep for five to six hours at a time, and can remain in bed upon waking.  The patient has been taking medication prescribed by Dr. Hermelinda Medicus, which he believes suppresses his appetite during the day, leading to cravings for sweets in the evening. He acknowledges this as a personal issue he needs to address. Despite this, he has lost six pounds since his last visit in  May.  The patient also reports occasional dizziness, but does not consider it a significant concern. He has recently had an eye exam and is due to see the doctor again soon. He is also on blood pressure medication, which may need to be adjusted due to a low heart rate.  The patient is active, engaging in physical activities such as cutting grass and working on projects. He has built a deck to watch the sunrise, which he believes is beneficial for his TBI. He also reports making a cutting board for a neighbor and working on other projects.  The patient's social interactions have improved over time. He recently attended a men's retreat with about 20 other individuals, which he managed well despite initial concerns about being in a group setting due to his TBI. He reports that he is more open and communicative when he feels comfortable with the people around him.        Medications: Outpatient Medications Prior to Visit  Medication Sig   ascorbic acid (VITAMIN C) 100 MG tablet Take 100 mg by mouth daily. Reported on 08/11/2015   B Complex-C (SUPER B COMPLEX PO) Take 1 tablet by mouth daily.   fluticasone (FLONASE) 50 MCG/ACT nasal spray    Garlic 1500 MG CAPS Take 1 capsule by mouth daily.    Melatonin 10 MG CAPS Take by mouth. Taking 15 mg   meloxicam (MOBIC) 7.5 MG tablet Take 1 tablet (7.5 mg total) by mouth daily.   Methylcellulose, Laxative, 500 MG TABS Take 1 tablet by mouth every evening.   methylphenidate (CONCERTA) 36 MG PO CR tablet Take 1 tablet (36 mg total) by mouth daily.   methylphenidate (CONCERTA) 36 MG PO CR tablet Take 1 tablet (36 mg total) by mouth daily.   methylphenidate (CONCERTA) 36 MG PO CR tablet Take 1 tablet (36 mg total) by mouth daily.   methylphenidate (CONCERTA) 36 MG PO CR tablet Take 1 tablet (36 mg total) by mouth daily.   Misc Natural Products (GLUCOSAMINE CHONDROITIN TRIPLE) TABS Take 2 tablets by mouth daily.   Multiple Vitamin tablet Take 1 tablet by  mouth daily.    naproxen (NAPROSYN) 500 MG tablet Take by mouth.   Omega-3 Fatty Acids (FISH OIL ULTRA) 1400 MG CAPS Take 1 capsule by mouth daily.   vitamin E 400 UNIT capsule Take 400 Units by mouth daily.   [DISCONTINUED] carvedilol (COREG) 12.5 MG tablet TAKE 1 TABLET BY MOUTH TWICE DAILY WITH MEALS   QUEtiapine (SEROQUEL) 100 MG tablet Take 1 tablet by mouth at bedtime.   No facility-administered medications prior to visit.    Review of Systems     Objective    BP 125/75 (BP Location: Left Arm, Patient Position: Sitting, Cuff Size: Normal)   Pulse (!) 49   Resp 13   Ht 6\' 1"  (1.854 m)   Wt 210 lb 8 oz (95.5 kg)   SpO2 96%   BMI 27.77 kg/m    Physical Exam Vitals reviewed.  Constitutional:      General: He is not in  acute distress.    Appearance: Normal appearance. He is not ill-appearing, toxic-appearing or diaphoretic.  Eyes:     Conjunctiva/sclera: Conjunctivae normal.  Cardiovascular:     Rate and Rhythm: Normal rate and regular rhythm.     Pulses: Normal pulses.     Heart sounds: Normal heart sounds. No murmur heard.    No friction rub. No gallop.  Pulmonary:     Effort: Pulmonary effort is normal. No respiratory distress.     Breath sounds: Normal breath sounds. No stridor. No wheezing, rhonchi or rales.  Abdominal:     General: Bowel sounds are normal. There is no distension.     Palpations: Abdomen is soft.     Tenderness: There is no abdominal tenderness.  Musculoskeletal:     Right lower leg: No edema.     Left lower leg: No edema.  Skin:    Findings: No erythema or rash.  Neurological:     Mental Status: He is alert and oriented to person, place, and time.       No results found for any visits on 10/09/22.  Assessment & Plan      Bradycardia: Heart rate of 49, potentially due to Carvedilol. No symptoms of dizziness or lightheadedness reported. -Taper Carvedilol with the goal of discontinuation. -Recheck heart rate in 1  month.  Hyperlipidemia: Elevated cholesterol levels previously noted. Patient reports a diet with occasional sweets. -Order lipid panel. -Provide dietary counseling for cholesterol management.  Traumatic Brain Injury (TBI): Patient reports improvement in symptoms with time and has been engaging in activities such as watching the sunrise and woodworking. -Continue current management.  Sleep Disturbance: Patient reports difficulty with sleep initiation but good sleep maintenance. Currently on Seroquel. -Continue current management.  Follow-up in 1 month to assess response to Carvedilol taper and review results of lipid panel.       Return in about 1 month (around 11/09/2022) for BP med changes.      The entirety of the information documented in the History of Present Illness, Review of Systems and Physical Exam were personally obtained by me. Portions of this information were initially documented by Lubertha Basque, CMA. I, Ronnald Ramp, MD have reviewed the documentation above for thoroughness and accuracy.   Ronnald Ramp, MD  [facility] [phone] (phone) [phone] (fax)  [facility]

## 2022-10-10 LAB — LIPID PANEL
Chol/HDL Ratio: 5 ratio (ref 0.0–5.0)
Cholesterol, Total: 213 mg/dL — ABNORMAL HIGH (ref 100–199)
HDL: 43 mg/dL (ref >39)
LDL Chol Calc (NIH): 143 mg/dL — ABNORMAL HIGH (ref 0–99)
Triglycerides: 149 mg/dL (ref 0–149)
VLDL Cholesterol Cal: 27 mg/dL (ref 5–40)

## 2022-10-13 DIAGNOSIS — H5212 Myopia, left eye: Secondary | ICD-10-CM | POA: Diagnosis not present

## 2022-10-13 DIAGNOSIS — H524 Presbyopia: Secondary | ICD-10-CM | POA: Diagnosis not present

## 2022-10-13 DIAGNOSIS — H2511 Age-related nuclear cataract, right eye: Secondary | ICD-10-CM | POA: Diagnosis not present

## 2022-10-13 DIAGNOSIS — H2512 Age-related nuclear cataract, left eye: Secondary | ICD-10-CM | POA: Diagnosis not present

## 2022-10-13 DIAGNOSIS — H468 Other optic neuritis: Secondary | ICD-10-CM | POA: Diagnosis not present

## 2022-10-23 ENCOUNTER — Encounter: Payer: Self-pay | Admitting: Family Medicine

## 2022-10-23 ENCOUNTER — Ambulatory Visit
Admission: RE | Admit: 2022-10-23 | Discharge: 2022-10-23 | Disposition: A | Discharge status: Home / Self Care | Payer: PPO | Source: Ambulatory Visit | Attending: Family Medicine | Admitting: Family Medicine

## 2022-10-23 ENCOUNTER — Ambulatory Visit (INDEPENDENT_AMBULATORY_CARE_PROVIDER_SITE_OTHER): Payer: PPO | Admitting: Family Medicine

## 2022-10-23 VITALS — BP 137/83 | HR 63 | Temp 98.2°F | Resp 12 | Ht 73.0 in | Wt 213.0 lb

## 2022-10-23 DIAGNOSIS — I700 Atherosclerosis of aorta: Secondary | ICD-10-CM | POA: Diagnosis not present

## 2022-10-23 DIAGNOSIS — M5442 Lumbago with sciatica, left side: Secondary | ICD-10-CM | POA: Diagnosis not present

## 2022-10-23 DIAGNOSIS — M47817 Spondylosis without myelopathy or radiculopathy, lumbosacral region: Secondary | ICD-10-CM | POA: Diagnosis not present

## 2022-10-23 DIAGNOSIS — M5440 Lumbago with sciatica, unspecified side: Secondary | ICD-10-CM | POA: Diagnosis not present

## 2022-10-23 DIAGNOSIS — M47816 Spondylosis without myelopathy or radiculopathy, lumbar region: Secondary | ICD-10-CM | POA: Diagnosis not present

## 2022-10-23 NOTE — Progress Notes (Signed)
Established patient visit   Patient: Justin Fox   DOB: 1960/12/27   62 y.o. Male  MRN: [id number] Visit Date: 10/23/2022  Today's healthcare provider: Mila Merry, MD   Chief Complaint  Patient presents with   Leg Pain    Patient C/O on left lower back radiating to left leg x 1 week. Patient denies any recent falls or injuries. He has taken naproxen, reports good pain control.    Subjective    Discussed the use of AI scribe software for clinical note transcription with the patient, who gave verbal consent to proceed.  History of Present Illness   The patient, with a history of brain injury and substance abuse, presents with a week-long history of back pain. The pain initially started in the lower back and upper hip region, then moved to the mid-back after a couple of days, and is currently localized in the upper lumbar are of midline. The patient also reports intermittent pain radiating down to the side and back of knee and calf. The onset of the back pain was rapid, with no known injury or strain, although he did move a piano a week prior. 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  His wife reports that his gait has reportedly changed, with increased 'slapping' of the left foot. The pain is managed with OTC Naproxen (2 x 220mg ), which provides significant relief, especially in the mornings. However, the pain worsens as the day progresses and is particularly severe by evening. The patient also reports that the pain disrupts sleep, necessitating getting up at night. His spouse notes that he has been sleeping in a recliner due to the discomfort.  The patient has a history of sleep issues, managed with Seroquel, and has a past brain injury that has reportedly lowered his pain threshold. The patient denies any new medications.       Medications: Outpatient Medications Prior to Visit  Medication  Sig   ascorbic acid (VITAMIN C) 100 MG tablet Take 100 mg by mouth daily. Reported on 08/11/2015   B Complex-C (SUPER B COMPLEX PO) Take 1 tablet by mouth daily.   carvedilol (COREG) 12.5 MG tablet Take 1 tablet (12.5 mg total) by mouth daily for 14 days, THEN 1 tablet (12.5 mg total) every other day for 14 days.   Garlic 1500 MG CAPS Take 1 capsule by mouth daily.    Melatonin 10 MG CAPS Take by mouth. Taking 15 mg   Methylcellulose, Laxative, 500 MG TABS Take 1 tablet by mouth every evening.   methylphenidate (CONCERTA) 36 MG PO CR tablet Take 1 tablet (36 mg total) by mouth daily.   Misc Natural Products (GLUCOSAMINE CHONDROITIN TRIPLE) TABS Take 2 tablets by mouth daily.   Multiple Vitamin tablet Take 1 tablet by mouth daily.    naproxen (NAPROSYN) 500 MG tablet Take by mouth as needed.   Omega-3 Fatty Acids (FISH OIL ULTRA) 1400 MG CAPS Take 1 capsule by mouth daily.   vitamin E 400 UNIT capsule Take 400 Units by mouth daily.   meloxicam (MOBIC) 7.5 MG tablet Take 1 tablet (7.5 mg total) by mouth daily.   methylphenidate (CONCERTA) 36 MG PO CR tablet Take 1 tablet (36 mg total) by mouth daily.   methylphenidate (CONCERTA) 36 MG PO CR tablet Take 1 tablet (36 mg total) by mouth daily.   methylphenidate (CONCERTA) 36 MG PO CR tablet Take  1 tablet (36 mg total) by mouth daily.   QUEtiapine (SEROQUEL) 100 MG tablet Take 1 tablet by mouth at bedtime.   [DISCONTINUED] fluticasone (FLONASE) 50 MCG/ACT nasal spray    No facility-administered medications prior to visit.   Review of Systems       Objective    BP 137/83 (BP Location: Left Arm, Patient Position: Sitting, Cuff Size: Large)   Pulse 63   Temp 98.2 F (36.8 C) (Temporal)   Resp 12   Ht 6\' 1"  (1.854 m)   Wt 213 lb (96.6 kg)   SpO2 96%   BMI 28.10 kg/m      Physical Exam  Physical Exam   MUSCULOSKELETAL: Tenderness upon palpation of the spine in the lower back and upper hip region. Tenderness upon palpation on the left  side of the spine, no tenderness on the right side.    No results found for any visits on 10/23/22.  Assessment & Plan     Assessment and Plan    Low Back Pain: Pain started a week ago, initially in the lower back/upper hip, then moved to the mid-back, and now localized to the lower back. Pain radiates down to the calf. No history of recent injury, but patient did move a piano a week ago. Tenderness noted on palpation of the spine. Possible sciatica. -Order lumbar spine X-ray to rule out serious pathology such as disc problem or arthritis. -Continue Naproxen 220mg  as needed, can take twice daily. -Consider adding Tylenol PM at night for additional pain relief and to aid sleep.           Mila Merry, MD  [facility] [phone] (phone) [phone] (fax)  [facility]

## 2022-11-03 NOTE — Progress Notes (Unsigned)
      Established patient visit   Patient: Justin Fox   DOB: Oct 12, 1960   62 y.o. Male  MRN: [id number] Visit Date: 11/09/2022  Today's healthcare provider: Ronnald Ramp, MD   No chief complaint on file.  Subjective      ***  Medications: Outpatient Medications Prior to Visit  Medication Sig   ascorbic acid (VITAMIN C) 100 MG tablet Take 100 mg by mouth daily. Reported on 08/11/2015   B Complex-C (SUPER B COMPLEX PO) Take 1 tablet by mouth daily.   carvedilol (COREG) 12.5 MG tablet Take 1 tablet (12.5 mg total) by mouth daily for 14 days, THEN 1 tablet (12.5 mg total) every other day for 14 days.   Garlic 1500 MG CAPS Take 1 capsule by mouth daily.    Melatonin 10 MG CAPS Take by mouth. Taking 15 mg   Methylcellulose, Laxative, 500 MG TABS Take 1 tablet by mouth every evening.   methylphenidate (CONCERTA) 36 MG PO CR tablet Take 1 tablet (36 mg total) by mouth daily.   methylphenidate (CONCERTA) 36 MG PO CR tablet Take 1 tablet (36 mg total) by mouth daily.   methylphenidate (CONCERTA) 36 MG PO CR tablet Take 1 tablet (36 mg total) by mouth daily.   methylphenidate (CONCERTA) 36 MG PO CR tablet Take 1 tablet (36 mg total) by mouth daily.   Misc Natural Products (GLUCOSAMINE CHONDROITIN TRIPLE) TABS Take 2 tablets by mouth daily.   Multiple Vitamin tablet Take 1 tablet by mouth daily.    naproxen (NAPROSYN) 500 MG tablet Take by mouth as needed.   Omega-3 Fatty Acids (FISH OIL ULTRA) 1400 MG CAPS Take 1 capsule by mouth daily.   QUEtiapine (SEROQUEL) 100 MG tablet Take 1 tablet by mouth at bedtime.   vitamin E 400 UNIT capsule Take 400 Units by mouth daily.   No facility-administered medications prior to visit.    Review of Systems  {Insert previous labs (optional):23779}  {See past labs  Heme  Chem  Endocrine  Serology  Results Review (optional):1}   Objective    There were no vitals taken for this visit. {Insert last BP/Wt (optional):23777}  {See  vitals history (optional):1}  Physical Exam  ***  No results found for any visits on 11/09/22.  Assessment & Plan     Problem List Items Addressed This Visit   None    No follow-ups on file.         Ronnald Ramp, MD  [facility] [phone] (phone) [phone] (fax)  [facility]

## 2022-11-09 ENCOUNTER — Ambulatory Visit (INDEPENDENT_AMBULATORY_CARE_PROVIDER_SITE_OTHER): Payer: PPO | Admitting: Family Medicine

## 2022-11-09 ENCOUNTER — Encounter: Payer: Self-pay | Admitting: Family Medicine

## 2022-11-09 VITALS — BP 139/88 | HR 62 | Temp 97.8°F | Ht 73.0 in | Wt 212.0 lb

## 2022-11-09 DIAGNOSIS — E7800 Pure hypercholesterolemia, unspecified: Secondary | ICD-10-CM | POA: Diagnosis not present

## 2022-11-09 DIAGNOSIS — I10 Essential (primary) hypertension: Secondary | ICD-10-CM

## 2022-11-09 MED ORDER — ROSUVASTATIN CALCIUM 10 MG PO TABS
10.0000 mg | ORAL_TABLET | Freq: Every day | ORAL | 3 refills | Status: DC
Start: 2022-11-09 — End: 2023-10-29

## 2022-11-09 MED ORDER — AMLODIPINE BESYLATE 5 MG PO TABS
5.0000 mg | ORAL_TABLET | Freq: Every day | ORAL | 3 refills | Status: DC
Start: 2022-11-09 — End: 2024-01-23

## 2022-11-09 NOTE — Assessment & Plan Note (Signed)
Total cholesterol 213, HDL 43, Triglycerides 149, LDL 143. Discussed the need for cholesterol-lowering medication to reduce the risk of vascular events. -counseled patient and his wife on elevated ASCVD score of 13.5%  -Start Crestor 10mg  daily. -Check cholesterol levels in three months.

## 2022-11-09 NOTE — Assessment & Plan Note (Signed)
Blood pressure slightly elevated at 139/88. Tapering off Carvedilol due to low heart rate. Discussed the need for additional antihypertensive medication. -Chronic, above goal of less than 130/80  -continue coreg taper, HR within normal range today  -Start Amlodipine 5mg  daily. -Check blood pressure at home and report back in two weeks.

## 2022-11-09 NOTE — Patient Instructions (Signed)
VISIT SUMMARY:  During your visit, we discussed your hypertension, cholesterol issues, and sciatica. We made some changes to your medications and discussed the importance of maintaining a healthy lifestyle.  YOUR PLAN:  -HYPERTENSION: Hypertension is high blood pressure. We are changing your medication from Carvedilol to Amlodipine to better manage your blood pressure without significantly lowering your heart rate.  -HYPERLIPIDEMIA: Hyperlipidemia is high cholesterol. We are starting you on Crestor, a medication to lower your cholesterol levels, to reduce the risk of heart disease.  -SCIATICA: Sciatica is pain that radiates along the path of the sciatic nerve, which branches from your lower back through your hips and buttocks and down each leg. Continue with your current pain management and stretching exercises.  -GENERAL HEALTH MAINTENANCE: It's great that you've quit smoking and have been smoke-free for 13 years. Keep up the good work!  INSTRUCTIONS:  Please start taking Amlodipine 5mg  daily for your hypertension and Crestor 10mg  daily for your cholesterol. Check your blood pressure at home and report back in two weeks. Also, check your cholesterol levels in three months. Continue with your current management and stretching exercises for sciatica.

## 2022-11-17 ENCOUNTER — Other Ambulatory Visit: Payer: Self-pay | Admitting: Physical Medicine and Rehabilitation

## 2022-11-17 DIAGNOSIS — S069X0S Unspecified intracranial injury without loss of consciousness, sequela: Secondary | ICD-10-CM

## 2022-11-20 MED ORDER — METHYLPHENIDATE HCL ER (OSM) 36 MG PO TBCR
36.0000 mg | EXTENDED_RELEASE_TABLET | Freq: Every day | ORAL | 0 refills | Status: DC
Start: 2022-11-20 — End: 2022-11-30

## 2022-11-23 NOTE — Progress Notes (Signed)
Established patient visit   Patient: Justin Fox   DOB: June 30, 1960   62 y.o. Male  MRN: [id number] Visit Date: 11/30/2022  Today's healthcare provider: Ronnald Ramp, MD   Chief Complaint  Patient presents with   Medical Management of Chronic Issues    2 week follow up on Hypertension, has had issues with his gait, would like to discuss, has been off lately    Subjective     HPI     Medical Management of Chronic Issues    Additional comments: 2 week follow up on Hypertension, has had issues with his gait, would like to discuss, has been off lately       Last edited by Rolly Salter, CMA on 11/30/2022 11:11 AM.       Discussed the use of AI scribe software for clinical note transcription with the patient, who gave verbal consent to proceed.  History of Present Illness   The patient, with a history of traumatic brain injury (TBI), presents with recent changes in gait and balance. He reports a sensation of imbalance, particularly when moving his feet or raising his arms. This imbalance is not associated with dizziness or lightheadedness. The patient's spouse corroborates these symptoms, noting an increase in stumbling and a change in the patient's gait over the past week. The patient's left foot appears to "slap" harder when walking, a symptom that has been consistent since the TBI occurred seven years ago.  The patient also reports a history of hypertension and hyperlipidemia, which are managed with amlodipine 5mg  daily and Crestor 50mg  daily, respectively. He has been adhering to his medication regimen and reports no issues with these medications.  The patient is also on Seroquel 100mg  daily for sleep and behavioral issues related to the TBI, and Concerta 36mg  daily. He has not had any changes in these medications since the TBI occurred.  The patient maintains an active lifestyle, including regular hiking, and has not had any falls or injuries recently. He  has not consumed alcohol in fifteen years.      Started crestor 10mg  daily for The 10-year ASCVD risk score (Arnett DK, et al., 2019) is: 13.6%     Medications: Outpatient Medications Prior to Visit  Medication Sig   amLODipine (NORVASC) 5 MG tablet Take 1 tablet (5 mg total) by mouth daily.   ascorbic acid (VITAMIN C) 100 MG tablet Take 100 mg by mouth daily. Reported on 08/11/2015   B Complex-C (SUPER B COMPLEX PO) Take 1 tablet by mouth daily.   Garlic 1500 MG CAPS Take 1 capsule by mouth daily.    lamoTRIgine (LAMICTAL) 25 MG tablet Take 50 mg by mouth 2 (two) times daily.   Melatonin 10 MG CAPS Take by mouth. Taking 15 mg   Methylcellulose, Laxative, 500 MG TABS Take 1 tablet by mouth every evening.   methylphenidate (CONCERTA) 36 MG PO CR tablet Take 1 tablet (36 mg total) by mouth daily.   Misc Natural Products (GLUCOSAMINE CHONDROITIN TRIPLE) TABS Take 2 tablets by mouth daily.   Multiple Vitamin tablet Take 1 tablet by mouth daily.    naproxen (NAPROSYN) 500 MG tablet Take by mouth as needed.   Omega-3 Fatty Acids (FISH OIL ULTRA) 1400 MG CAPS Take 1 capsule by mouth daily.   rosuvastatin (CRESTOR) 10 MG tablet Take 1 tablet (10 mg total) by mouth daily.   vitamin E 400 UNIT capsule Take 400 Units by mouth daily.   [DISCONTINUED] methylphenidate (CONCERTA)  36 MG PO CR tablet Take 1 tablet (36 mg total) by mouth daily.   QUEtiapine (SEROQUEL) 100 MG tablet Take 1 tablet by mouth at bedtime.   [DISCONTINUED] carvedilol (COREG) 12.5 MG tablet Take 1 tablet (12.5 mg total) by mouth daily for 14 days, THEN 1 tablet (12.5 mg total) every other day for 14 days.   No facility-administered medications prior to visit.    Review of Systems      Objective    BP 128/88 (BP Location: Left Arm, Patient Position: Sitting, Cuff Size: Large)   Pulse 71   Ht 6\' 1"  (1.854 m)   Wt 210 lb 9.6 oz (95.5 kg)   SpO2 98%   BMI 27.79 kg/m  BP Readings from Last 3 Encounters:  11/30/22 128/88   11/09/22 139/88  10/23/22 137/83       Physical Exam  Physical Exam   General: Alert, no acute distress Cardio: Normal S1 and S2, RRR, no r/m/g Pulm: CTAB, normal work of breathing Abdomen: Bowel sounds normal. Abdomen soft and non-tender. No distention  NEUROLOGICAL: Positive Romberg sign. Unable to tolerate pronator drift testing due to dizziness. Issues with proprioception noted. Slight left foot drop observed. Gait normal distance. Symptoms consistent with cerebellar ataxia.       No results found for any visits on 11/30/22.  Assessment & Plan     Problem List Items Addressed This Visit     Benign essential HTN    Well-controlled on current medication. -Continue Amlodipine 5mg  daily. CMP ordered today  Tolerated stopping beta blocker well, HR 71 and WNL       Gait disturbance - Primary    Recent onset of unsteadiness and imbalance, particularly when moving feet or raising arms. No associated dizziness, numbness, or falls. Positive Romberg test and issues with proprioception noted. 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 No changes in medication regimen for TBI in the past seven years. -Order labs including vitamin B1, B12, folate, parathyroid, and thyroid studies to evaluate for any metabolic causes. -Schedule follow-up with neurology for further evaluation and management.      Relevant Orders   Vitamin B1   TSH+T4F+T3Free   CBC   Comprehensive metabolic panel   U04 and Folate Panel   Pure hypercholesterolemia    Tolerating current medication well. Chronic  Lab Results  Component Value Date   CHOL 213 (H) 10/09/2022   HDL 43 10/09/2022   LDLCALC 143 (H) 10/09/2022   TRIG 149 10/09/2022   CHOLHDL 5.0 10/09/2022   Continue physical activity and dietary management -Continue Crestor 10mg  daily.        Traumatic Brain Injury (TBI) Stable, on Seroquel 100mg  for sleep and behavior, and Concerta 36mg  daily. -Continue current regimen.         No follow-ups on  file.         Ronnald Ramp, MD  [facility] [phone] (phone) [phone] (fax)  [facility]

## 2022-11-30 ENCOUNTER — Ambulatory Visit (INDEPENDENT_AMBULATORY_CARE_PROVIDER_SITE_OTHER): Payer: PPO | Admitting: Family Medicine

## 2022-11-30 ENCOUNTER — Encounter: Payer: Self-pay | Admitting: Family Medicine

## 2022-11-30 VITALS — BP 128/88 | HR 71 | Ht 73.0 in | Wt 210.6 lb

## 2022-11-30 DIAGNOSIS — E7800 Pure hypercholesterolemia, unspecified: Secondary | ICD-10-CM | POA: Diagnosis not present

## 2022-11-30 DIAGNOSIS — R269 Unspecified abnormalities of gait and mobility: Secondary | ICD-10-CM

## 2022-11-30 DIAGNOSIS — I10 Essential (primary) hypertension: Secondary | ICD-10-CM

## 2022-11-30 NOTE — Assessment & Plan Note (Signed)
Tolerating current medication well. Chronic  Lab Results  Component Value Date   CHOL 213 (H) 10/09/2022   HDL 43 10/09/2022   LDLCALC 143 (H) 10/09/2022   TRIG 149 10/09/2022   CHOLHDL 5.0 10/09/2022   Continue physical activity and dietary management -Continue Crestor 10mg  daily.

## 2022-11-30 NOTE — Assessment & Plan Note (Signed)
Recent onset of unsteadiness and imbalance, particularly when moving feet or raising arms. No associated dizziness, numbness, or falls. Positive Romberg test and issues with proprioception noted. 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 No changes in medication regimen for TBI in the past seven years. -Order labs including vitamin B1, B12, folate, parathyroid, and thyroid studies to evaluate for any metabolic causes. -Schedule follow-up with neurology for further evaluation and management.

## 2022-11-30 NOTE — Assessment & Plan Note (Signed)
Well-controlled on current medication. -Continue Amlodipine 5mg  daily. CMP ordered today  Tolerated stopping beta blocker well, HR 71 and WNL

## 2022-12-08 LAB — TSH+T4F+T3FREE
Free T4: 0.93 ng/dL (ref 0.82–1.77)
T3, Free: 3 pg/mL (ref 2.0–4.4)
TSH: 1.18 u[IU]/mL (ref 0.450–4.500)

## 2022-12-08 LAB — CBC
Hematocrit: 44 % (ref 37.5–51.0)
Hemoglobin: 14.6 g/dL (ref 13.0–17.7)
MCH: 30.7 pg (ref 26.6–33.0)
MCHC: 33.2 g/dL (ref 31.5–35.7)
MCV: 92 fL (ref 79–97)
Platelets: 265 10*3/uL (ref 150–450)
RBC: 4.76 x10E6/uL (ref 4.14–5.80)
RDW: 11.7 % (ref 11.6–15.4)
WBC: 7.7 10*3/uL (ref 3.4–10.8)

## 2022-12-08 LAB — COMPREHENSIVE METABOLIC PANEL
ALT: 25 IU/L (ref 0–44)
AST: 20 IU/L (ref 0–40)
Albumin: 4.4 g/dL (ref 3.9–4.9)
Alkaline Phosphatase: 67 IU/L (ref 44–121)
BUN/Creatinine Ratio: 14 (ref 10–24)
BUN: 14 mg/dL (ref 8–27)
Bilirubin Total: 0.6 mg/dL (ref 0.0–1.2)
CO2: 24 mmol/L (ref 20–29)
Calcium: 9.3 mg/dL (ref 8.6–10.2)
Chloride: 104 mmol/L (ref 96–106)
Creatinine, Ser: 1.02 mg/dL (ref 0.76–1.27)
Globulin, Total: 2.4 g/dL (ref 1.5–4.5)
Glucose: 128 mg/dL — ABNORMAL HIGH (ref 70–99)
Potassium: 4.2 mmol/L (ref 3.5–5.2)
Sodium: 141 mmol/L (ref 134–144)
Total Protein: 6.8 g/dL (ref 6.0–8.5)
eGFR: 83 mL/min/{1.73_m2} (ref >59)

## 2022-12-08 LAB — B12 AND FOLATE PANEL
Folate: >20 ng/mL (ref >3.0)
Vitamin B-12: 825 pg/mL (ref 232–1245)

## 2022-12-08 LAB — VITAMIN B1: Thiamine: 197.3 nmol/L (ref 66.5–200.0)

## 2022-12-18 DIAGNOSIS — R2689 Other abnormalities of gait and mobility: Secondary | ICD-10-CM | POA: Diagnosis not present

## 2022-12-18 DIAGNOSIS — S069X0S Unspecified intracranial injury without loss of consciousness, sequela: Secondary | ICD-10-CM | POA: Diagnosis not present

## 2022-12-18 DIAGNOSIS — R269 Unspecified abnormalities of gait and mobility: Secondary | ICD-10-CM | POA: Diagnosis not present

## 2022-12-18 DIAGNOSIS — Z79899 Other long term (current) drug therapy: Secondary | ICD-10-CM | POA: Diagnosis not present

## 2022-12-18 DIAGNOSIS — R001 Bradycardia, unspecified: Secondary | ICD-10-CM | POA: Diagnosis not present

## 2022-12-18 DIAGNOSIS — F068 Other specified mental disorders due to known physiological condition: Secondary | ICD-10-CM | POA: Diagnosis not present

## 2022-12-20 ENCOUNTER — Other Ambulatory Visit: Payer: Self-pay | Admitting: Student

## 2022-12-20 ENCOUNTER — Ambulatory Visit: Payer: PPO | Attending: Neurology

## 2022-12-20 DIAGNOSIS — R278 Other lack of coordination: Secondary | ICD-10-CM | POA: Insufficient documentation

## 2022-12-20 DIAGNOSIS — M6281 Muscle weakness (generalized): Secondary | ICD-10-CM | POA: Diagnosis not present

## 2022-12-20 DIAGNOSIS — R262 Difficulty in walking, not elsewhere classified: Secondary | ICD-10-CM | POA: Insufficient documentation

## 2022-12-20 DIAGNOSIS — R2689 Other abnormalities of gait and mobility: Secondary | ICD-10-CM | POA: Diagnosis not present

## 2022-12-20 DIAGNOSIS — R2681 Unsteadiness on feet: Secondary | ICD-10-CM | POA: Diagnosis not present

## 2022-12-20 DIAGNOSIS — R269 Unspecified abnormalities of gait and mobility: Secondary | ICD-10-CM | POA: Diagnosis not present

## 2022-12-20 NOTE — Therapy (Signed)
OUTPATIENT PHYSICAL THERAPY NEURO EVALUATION   Patient Name: Justin Fox MRN: [id number] DOB:Jan 05, 1961, 62 y.o., male Today's Date: 12/21/2022   PCP: Dr.  Marlou Sa- Roxan Hockey REFERRING PROVIDER: D. Hemang Shah  END OF SESSION:  PT End of Session - 12/20/22 1539     Visit Number 1    Number of Visits 24    Date for PT Re-Evaluation 03/14/23    Progress Note Due on Visit 10    PT Start Time 1530    PT Stop Time 1616    PT Time Calculation (min) 46 min    Equipment Utilized During Treatment Gait belt    Activity Tolerance Patient tolerated treatment well    Behavior During Therapy WFL for tasks assessed/performed             Past Medical History:  Diagnosis Date   Alcohol dependence (HCC) 01/28/2015   Avoid any potentially addictive meds.     Benign neoplasm of descending colon    Benign neoplasm of sigmoid colon    Closed displaced comminuted fracture of shaft of left radius    Colles' fracture of left radius    Fracture of face bones (HCC)    Hypertension    Radial styloid fracture    Past Surgical History:  Procedure Laterality Date   COLONOSCOPY WITH PROPOFOL N/A 10/12/2015   Procedure: COLONOSCOPY WITH PROPOFOL;  Surgeon: Midge Minium, MD;  Location: [facility] ENDOSCOPY;  Service: Endoscopy;  Laterality: N/A;   NO PAST SURGERIES     Patient Active Problem List   Diagnosis Date Noted   Gait disturbance 11/30/2022   Pure hypercholesterolemia 11/09/2022   High risk medication use 06/08/2022   History of smoking greater than 50 pack years 06/08/2022   Annual physical exam 06/08/2022   Encounter for annual wellness exam in Medicare patient 06/07/2022   NSAID long-term use 03/15/2022   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   Right rotator cuff tendonitis 07/19/2016   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, damage, 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   Traumatic brain injury with loss of consciousness of 1 hour to  5 hours 59 minutes (HCC) 12/27/2015   Dysphagia    Urinary retention    Slow transit constipation    Benign essential HTN 01/28/2015   Genital warts 01/28/2015   Insomnia 01/28/2015    ONSET DATE: worse over past several months  REFERRING DIAG: R26.89 (ICD-10-CM) - Imbalance  THERAPY DIAG:  Abnormality of gait and mobility  Difficulty in walking, not elsewhere classified  Muscle weakness (generalized)  Other abnormalities of gait and mobility  Other lack of coordination  Unsteadiness on feet  Rationale for Evaluation and Treatment: Rehabilitation  SUBJECTIVE:  SUBJECTIVE STATEMENT: Patient reports having increased balance issues- only 1 fall in past 6 months. States he had a TBI several years ago and processes things slowly at times. Reports periods where he almost freezes or just blanks out with imbalance.    Pt accompanied by: significant other  PERTINENT HISTORY: per Medical note from Dr. Ronnald Ramp on 11/30/2022-  Gait disturbance - Primary       Recent onset of unsteadiness and imbalance, particularly when moving feet or raising arms. No associated dizziness, numbness, or falls. Positive Romberg test and issues with proprioception noted. 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 No changes in medication regimen for TBI in the past seven years. -Order labs including vitamin B1, B12, folate, parathyroid, and thyroid studies to evaluate for any metabolic causes. -Schedule follow-up with neurology for further evaluation and management.    Patient was seen by Janice Coffin, PA [facility])  and referred to PT due to some imbalance  PAIN:  Are you having pain? No  PRECAUTIONS: Fall  RED FLAGS: None   WEIGHT BEARING RESTRICTIONS: No  FALLS: Has patient fallen in last 6 months? Yes.  Number of falls 1  LIVING ENVIRONMENT: Lives with: lives with their spouse Lives in: House/apartment Stairs: Yes: External: 6 steps; can reach both Has following equipment at home:  hiking stick, Trekking poles  PLOF: Independent  PATIENT GOALS: Want to improve my balance  OBJECTIVE:   DIAGNOSTIC FINDINGS: CLINICAL DATA:  low back pain with sciatica   EXAM: LUMBAR SPINE - COMPLETE 4+ VIEW   COMPARISON:  None Available.   FINDINGS: There are five non-rib bearing lumbar-type vertebral bodies. There is normal alignment. There is no evidence for acute fracture or subluxation. Mild to moderate intervertebral disc space height loss of L5-S1. Minimal endplate proliferative changes. Mild lower lumbar facet arthropathy. Atherosclerotic calcifications of the aorta.   IMPRESSION: Mild to moderate degenerative changes of L5-S1.     Electronically Signed   By: Meda Klinefelter M.D.   On: 10/30/2022 09:28  COGNITION: Overall cognitive status: Impaired- short term memory loss from TBI, delayed processing   SENSATION: WFL  COORDINATION: Delayed at time  EDEMA:   None observed  POSTURE: rounded shoulders and forward head  LOWER EXTREMITY MMT:    MMT Right Eval Left Eval  Hip flexion 4 4  Hip extension 4 4  Hip abduction 4 4  Hip adduction 4 4  Hip internal rotation 4 4  Hip external rotation 4 4  Knee flexion 4+ 4+  Knee extension 4 4  Ankle dorsiflexion 4 4  Ankle plantarflexion    Ankle inversion    Ankle eversion    (Blank rows = not tested)   TRANSFERS: Assistive device utilized: None  Sit to stand: Complete Independence Stand to sit: Complete Independence Chair to chair: Complete Independence Floor:  not assessed   GAIT: Gait pattern: decreased arm swing- Right, decreased arm swing- Left, decreased step length- Right, decreased step length- Left, decreased stance time- Right, and decreased stance time- Left Distance walked: approx 100  ft Assistive device utilized:  none Level of assistance: SBA Comments: Forward trunk lean, slight impulsive, unsteady with turning  FUNCTIONAL TESTS:  5 times sit to stand: 9.65 sec without UE support Timed up and go (TUG): 14.48 sec without UE support 10 meter walk test: 9.90 sec or 1.0 m/s  Berg Balance Scale: 48/56  PATIENT SURVEYS:  FOTO 57  TODAY'S TREATMENT:  DATE: Physical Therapy Evaluation BP= 132/98 mmHg right UE     PATIENT EDUCATION: Education details: PT plan of care, purpose of functional outcome measures Person educated: Patient and Spouse Education method: Explanation Education comprehension: verbalized understanding  HOME EXERCISE PROGRAM: To be initiated next 1-2 visits  GOALS: Goals reviewed with patient? Yes  SHORT TERM GOALS: Target date: 01/31/2023  Pt will be independent with HEP in order to improve strength and balance in order to decrease fall risk and improve function at home and work.  Baseline: Eval- Patient presents with no formal HEP in place Goal status: INITIAL   LONG TERM GOALS: Target date: 03/14/2023 ASSESSMENT:    1.  Patient (> 8 years old) will complete five times sit to stand test in < 15 seconds indicating an increased LE strength and improved balance. Baseline: EVAL= 9.65 sec Goal status: INITIAL  2.  Patient will increase FOTO score to equal to or greater than  62   to demonstrate statistically significant improvement in mobility and quality of life.  Baseline: EVAL= 57 Goal status: INITIAL   3.  Patient will increase Berg Balance score by > 4 points to demonstrate decreased fall risk during functional activities. Baseline: Eval= 48/56 Goal status: INITIAL       CLINICAL IMPRESSION: Patient is a 62 y.o. male who was seen today for physical therapy evaluation and treatment for imbalance. Patient  presents with mild BLE muscle weakness yet unsteady with gait- specifically turning and narrowed standing. He presents with imbalance and scored 48/56 on BERG  indicating some balance impairments. He will benefit from skilled PT services to improve his dynamic standing balance and reduce risk of falling.   OBJECTIVE IMPAIRMENTS: Abnormal gait, decreased activity tolerance, decreased balance, decreased cognition, decreased mobility, difficulty walking, and decreased strength.   ACTIVITY LIMITATIONS: carrying, lifting, bending, standing, and squatting  PARTICIPATION LIMITATIONS: cleaning, shopping, community activity, and yard work  PERSONAL FACTORS: 1 comorbidity: TBI  are also affecting patient's functional outcome.   REHAB POTENTIAL: Good  CLINICAL DECISION MAKING: Stable/uncomplicated  EVALUATION COMPLEXITY: Low  PLAN:  PT FREQUENCY: 1-2x/week  PT DURATION: 12 weeks  PLANNED INTERVENTIONS: Therapeutic exercises, Therapeutic activity, Neuromuscular re-education, Balance training, Gait training, Patient/Family education, Self Care, Joint mobilization, Joint manipulation, Stair training, Vestibular training, Canalith repositioning, Dry Needling, Electrical stimulation, Spinal manipulation, Spinal mobilization, Cryotherapy, Moist heat, Taping, Manual therapy, and Re-evaluation  PLAN FOR NEXT SESSION: Initiate balance training and add to HEP as appropriate.    Lenda Kelp, PT 12/21/2022, 4:03 PM

## 2022-12-23 ENCOUNTER — Other Ambulatory Visit: Payer: Self-pay | Admitting: Family Medicine

## 2022-12-25 ENCOUNTER — Ambulatory Visit: Payer: PPO | Admitting: Physical Therapy

## 2022-12-25 NOTE — Telephone Encounter (Signed)
Requested Prescriptions  Refused Prescriptions Disp Refills   carvedilol (COREG) 12.5 MG tablet [Pharmacy Med Name: Carvedilol 12.5 MG Oral Tablet] 180 tablet 0    Sig: TAKE 1 TABLET BY MOUTH TWICE DAILY WITH MEALS     Cardiovascular: Beta Blockers 3 Passed - 12/23/2022  6:52 AM      Passed - Cr in normal range and within 360 days    Creatinine, Ser  Date Value Ref Range Status  11/30/2022 1.02 0.76 - 1.27 mg/dL Final         Passed - AST in normal range and within 360 days    AST  Date Value Ref Range Status  11/30/2022 20 0 - 40 IU/L Final         Passed - ALT in normal range and within 360 days    ALT  Date Value Ref Range Status  11/30/2022 25 0 - 44 IU/L Final         Passed - Last BP in normal range    BP Readings from Last 1 Encounters:  11/30/22 128/88         Passed - Last Heart Rate in normal range    Pulse Readings from Last 1 Encounters:  11/30/22 71         Passed - Valid encounter within last 6 months    Recent Outpatient Visits           3 weeks ago Gait disturbance   [place] [facility] Simmons-Robinson, [place], MD   1 month ago Benign essential HTN   [place] [facility] [place], [place], MD   2 months ago Acute left-sided low back pain with left-sided sciatica   [facility] Malva Limes, MD   2 months ago Benign essential HTN   [place] [facility] Simmons-Robinson, [place], MD   6 months ago Encounter for annual wellness exam in Medicare patient    [facility] [place], [place], MD

## 2022-12-26 ENCOUNTER — Ambulatory Visit
Admission: RE | Admit: 2022-12-26 | Discharge: 2022-12-26 | Disposition: A | Discharge status: Home / Self Care | Payer: PPO | Source: Ambulatory Visit | Attending: Student | Admitting: Student

## 2022-12-26 DIAGNOSIS — R2689 Other abnormalities of gait and mobility: Secondary | ICD-10-CM | POA: Diagnosis not present

## 2022-12-26 DIAGNOSIS — G319 Degenerative disease of nervous system, unspecified: Secondary | ICD-10-CM | POA: Diagnosis not present

## 2022-12-26 DIAGNOSIS — G9389 Other specified disorders of brain: Secondary | ICD-10-CM | POA: Diagnosis not present

## 2022-12-27 ENCOUNTER — Encounter: Payer: PPO | Attending: Physical Medicine & Rehabilitation | Admitting: Physical Medicine & Rehabilitation

## 2022-12-27 ENCOUNTER — Encounter: Payer: Self-pay | Admitting: Physical Medicine & Rehabilitation

## 2022-12-27 VITALS — BP 126/81 | HR 65 | Ht 73.0 in | Wt 212.0 lb

## 2022-12-27 DIAGNOSIS — F068 Other specified mental disorders due to known physiological condition: Secondary | ICD-10-CM | POA: Diagnosis not present

## 2022-12-27 DIAGNOSIS — S069X0S Unspecified intracranial injury without loss of consciousness, sequela: Secondary | ICD-10-CM

## 2022-12-27 DIAGNOSIS — R269 Unspecified abnormalities of gait and mobility: Secondary | ICD-10-CM | POA: Diagnosis not present

## 2022-12-27 MED ORDER — METHYLPHENIDATE HCL ER (OSM) 36 MG PO TBCR
36.0000 mg | EXTENDED_RELEASE_TABLET | Freq: Every day | ORAL | 0 refills | Status: DC
Start: 2022-12-27 — End: 2023-02-15

## 2022-12-27 MED ORDER — METHYLPHENIDATE HCL ER (OSM) 36 MG PO TBCR
36.0000 mg | EXTENDED_RELEASE_TABLET | Freq: Every day | ORAL | 0 refills | Status: DC
Start: 2022-12-27 — End: 2023-05-24

## 2022-12-27 NOTE — Progress Notes (Signed)
Subjective:    Patient ID: Justin Fox, male    DOB: 05-Jun-1960, 62 y.o.   MRN: [id number]  HPI  Justin Fox is here in follow up of his TBI. For the most part he has been feeling well. He reports that he has had some dizziness or loss of equilibrium worse over the last 2 months. He noticed it when he was changed positions. He feels that it is a lot better recently however. He was referred to neurology.. A CT was ordered by neurology which was done yesterday. I reviewed it and don't see anything acute. CHronic changes at [facility] site are noted as well as some global atrophy. . He has gone to PT for an evaluation. Vestibular rx is planned among other treatments. He does have reduced balance on BERG  He has remained active and likes to do woodwork. He showed me an awesome cutting board/egg holder he made out of thee types of wod. Justin Fox still has social anxiety, feels overwhelmed.    Pain Inventory Average Pain 0 Pain Right Now 0 My pain is  No pain only balance issue  LOCATION OF PAIN  No pain  BOWEL Number of stools per week: 7 Oral laxative use  fiber pill   BLADDER Normal  Difficulty starting stream  sometimes    Mobility walk without assistance ability to climb steps?  yes do you drive?  yes Do you have any goals in this area?  yes  Function disabled: date disabled maybe 7 years Do you have any goals in this area?  yes  Neuro/Psych trouble walking anxiety  Prior Studies x-rays CT/MRI All testing in [facility]  Physicians involved in your care Any changes since last visit?  no   Family History  Problem Relation Age of Onset   Anxiety disorder Mother    Thyroid disease Mother    Social History   Socioeconomic History   Marital status: Married    Spouse name: Not on file   Number of children: Not on file   Years of education: Not on file   Highest education level: Not on file  Occupational History   Not on file  Tobacco Use   Smoking status:  Former    Current packs/day: 0.00    Average packs/day: 1.5 packs/day for 35.0 years (52.5 ttl pk-yrs)    Types: Cigarettes    Start date: 04/03/1973    Quit date: 04/03/2008    Years since quitting: 14.7   Smokeless tobacco: Never   Tobacco comments:    Has been quit for 7-8 years  Vaping Use   Vaping status: Never Used  Substance and Sexual Activity   Alcohol use: No    Comment: Has had issues with this in the past. Has not drank in 7-8 years.   Drug use: No   Sexual activity: Never  Other Topics Concern   Not on file  Social History Narrative   Not on file   Social Determinants of Health   Financial Resource Strain: Low Risk  (08/07/2022)   Overall Financial Resource Strain (CARDIA)    Difficulty of Paying Living Expenses: Not hard at all  Food Insecurity: No Food Insecurity (08/07/2022)   Hunger Vital Sign    Worried About Running Out of Food in the Last Year: Never true    Ran Out of Food in the Last Year: Never true  Transportation Needs: No Transportation Needs (08/07/2022)   PRAPARE - Transportation    Lack of Transportation (  Medical): No    Lack of Transportation (Non-Medical): No  Physical Activity: Sufficiently Active (08/07/2022)   Exercise Vital Sign    Days of Exercise per Week: 4 days    Minutes of Exercise per Session: 40 min  Stress: No Stress Concern Present (08/07/2022)   [employer] of Occupational Health - Occupational Stress Questionnaire    Feeling of Stress : Only a little  Social Connections: Moderately Integrated (08/07/2022)   Social Connection and Isolation Panel [NHANES]    Frequency of Communication with Friends and Family: Twice a week    Frequency of Social Gatherings with Friends and Family: Once a week    Attends Religious Services: More than 4 times per year    Active Member of [employer] or Organizations: No    Attends Banker Meetings: Never    Marital Status: Married   Past Surgical History:  Procedure Laterality Date    COLONOSCOPY WITH PROPOFOL N/A 10/12/2015   Procedure: COLONOSCOPY WITH PROPOFOL;  Surgeon: Midge Minium, MD;  Location: [facility] ENDOSCOPY;  Service: Endoscopy;  Laterality: N/A;   NO PAST SURGERIES     Past Medical History:  Diagnosis Date   Alcohol dependence (HCC) 01/28/2015   Avoid any potentially addictive meds.     Benign neoplasm of descending colon    Benign neoplasm of sigmoid colon    Closed displaced comminuted fracture of shaft of left radius    Colles' fracture of left radius    Fracture of face bones (HCC)    Hypertension    Radial styloid fracture    There were no vitals taken for this visit.  Opioid Risk Score:   Fall Risk Score:  `1  Depression screen [facility] 2/9     10/23/2022    2:45 PM 10/09/2022    9:29 AM 08/07/2022   11:19 AM 06/21/2022    9:25 AM 06/08/2022    9:15 AM 03/07/2022    3:34 PM 12/30/2021    8:14 AM  Depression screen PHQ 2/9  Decreased Interest 3 0 0 0 0 0 1  Down, Depressed, Hopeless 1 0 0 0 0 0 0  PHQ - 2 Score 4 0 0 0 0 0 1  Altered sleeping 2     0 1  Tired, decreased energy 0    0 0 0  Change in appetite 1    0 0 0  Feeling bad or failure about yourself  0    0 0 0  Trouble concentrating 0    0 0 0  Moving slowly or fidgety/restless 1    0 0 0  Suicidal thoughts 0    0 0 0  PHQ-9 Score 8     0 2  Difficult doing work/chores Not difficult at all    Not difficult at all Not difficult at all Not difficult at all    Review of Systems  Musculoskeletal:  Negative for gait problem.       Having balance issues  Neurological:  Negative for weakness.  Psychiatric/Behavioral:         Anxiety  All other systems reviewed and are negative.      Objective:   Physical Exam  General: No acute distress HEENT: NCAT, EOMI, oral membranes moist Cards: reg rate  Chest: normal effort Abdomen: Soft, NT, ND Skin: dry, intact Extremities: no edema Psych: pleasant and appropriate  Neurological: oriented x 3. Alert. . Fairly focused. Improved insight and  awareness. Motor: 5/5  All 4's. Balance: romberg +,  2-3 beats of nystagmus with gaze to right Musc  full ROM , no pain.      Assessment & Plan:      1. 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     -having vertigo,?BPPV to right           -continue with outpt therapies             -sleep reasonable 2. Mood/behavior           - can continue lamictal for mood control.  Stable at present                     -concerta 36mg  daily. RF today          We will continue the controlled substance monitoring program, this consists of regular clinic visits, examinations, routine drug screening, pill counts as well as use of [place] Controlled Substance Reporting System. [place] was [place] today.             Medication was refilled and a second prescription was sent to the patient's pharmacy for next month.               continue melatonin  for sleep aditionally           -continue seroquel at 100mg  qhs.               3. HTN- controlled             per PCP 4. Right rotator cuff syndrome/adhesive capsulitis:               -functional ROM--improved     20 minutes of face to face patient care time were spent during this visit. All questions were encouraged and answered.  Follow up with me in 6 mos.

## 2022-12-27 NOTE — Patient Instructions (Signed)
ALWAYS FEEL FREE TO CALL OUR OFFICE WITH ANY PROBLEMS OR QUESTIONS ([phone])  **PLEASE NOTE** ALL MEDICATION REFILL REQUESTS (INCLUDING CONTROLLED SUBSTANCES) NEED TO BE MADE AT LEAST 7 DAYS PRIOR TO REFILL BEING DUE. ANY REFILL REQUESTS INSIDE THAT TIME FRAME MAY RESULT IN DELAYS IN RECEIVING YOUR PRESCRIPTION.                    

## 2022-12-29 ENCOUNTER — Ambulatory Visit: Payer: PPO

## 2022-12-29 DIAGNOSIS — R262 Difficulty in walking, not elsewhere classified: Secondary | ICD-10-CM

## 2022-12-29 DIAGNOSIS — M6281 Muscle weakness (generalized): Secondary | ICD-10-CM

## 2022-12-29 DIAGNOSIS — R269 Unspecified abnormalities of gait and mobility: Secondary | ICD-10-CM | POA: Diagnosis not present

## 2022-12-29 NOTE — Therapy (Signed)
OUTPATIENT PHYSICAL THERAPY NEURO   Patient Name: Justin Fox MRN: [id number] DOB:1960-11-12, 62 y.o., male Today's Date: 12/29/2022   PCP: Dr.  Marlou Sa- Roxan Hockey REFERRING PROVIDER: D. Hemang Shah  END OF SESSION:  PT End of Session - 12/29/22 1020     Visit Number 2    Number of Visits 24    Date for PT Re-Evaluation 03/14/23    Authorization Type [employer]    Authorization Time Period 12/20/22-03/14/23    Progress Note Due on Visit 10    PT Start Time 1015    PT Stop Time 1055    PT Time Calculation (min) 40 min    Equipment Utilized During Treatment Gait belt    Activity Tolerance Patient tolerated treatment well    Behavior During Therapy WFL for tasks assessed/performed             Past Medical History:  Diagnosis Date   Alcohol dependence (HCC) 01/28/2015   Avoid any potentially addictive meds.     Benign neoplasm of descending colon    Benign neoplasm of sigmoid colon    Closed displaced comminuted fracture of shaft of left radius    Colles' fracture of left radius    Fracture of face bones (HCC)    Hypertension    Radial styloid fracture    Past Surgical History:  Procedure Laterality Date   COLONOSCOPY WITH PROPOFOL N/A 10/12/2015   Procedure: COLONOSCOPY WITH PROPOFOL;  Surgeon: Midge Minium, MD;  Location: [facility] ENDOSCOPY;  Service: Endoscopy;  Laterality: N/A;   NO PAST SURGERIES     Patient Active Problem List   Diagnosis Date Noted   Gait disturbance 11/30/2022   Pure hypercholesterolemia 11/09/2022   High risk medication use 06/08/2022   History of smoking greater than 50 pack years 06/08/2022   Annual physical exam 06/08/2022   Encounter for annual wellness exam in Medicare patient 06/07/2022   NSAID long-term use 03/15/2022   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   Right rotator cuff tendonitis 07/19/2016   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, damage, 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 Pt reminds author of short term memory difficulty.    Pt accompanied by: no body (a capella)  PERTINENT HISTORY:  Justin Fox is a 62yoM who is referred to [facility] for new imbalance, presents to [facility] OPPT 12/20/22 for evaluation. Pt Hx of brain injury 2017 after fall from ladder, bilat wrist fractures, facial fractures.. Pt also seen by PCP in July 2024 with acute mid back pain with radiation into the calf, resolved with naproxen, acetaminophen. Previously seen in September 2023 for Right hip pain, trochanteric bursitis.  Pt familiar to this clinic from services in 2018. PAIN:  Are you having pain? No  PRECAUTIONS: Fall  RED FLAGS: None   WEIGHT BEARING RESTRICTIONS: No  FALLS: Has patient fallen in last 6 months? Yes. Number of falls 1  PATIENT GOALS: Want to improve my balance  OBJECTIVE:    TODAY'S TREATMENT:                                                                                                                              DATE: 12/29/22  -STS x10, feet on airex, elevated surface, hands free -seated deadlift wth 5lb AW in hands, feet  on airex  -STS x10, feet on airex, elevated surface, hands free -seated eyes closed horizontal head turns x20, alternating -seated eyes closed vertical head turns x20, alternating -airex pad stance x 5 minutes (normal stance, eyes open, progressing to horizontal head turns, then invisible bench press)  -airex pad stance c barbell press (unable due to Rt shoulder pain)  -3 airex pad on yoga mat: forward and backward stepping pad to pad, then performed lateral stepping  -AMB endeavor x5: on yoga mat foam pad, gray step, and tight turn around M&Ms Skull     PATIENT EDUCATION: Education details: PT plan of care, purpose of functional outcome measures Person educated: Patient and Spouse Education method: Explanation Education comprehension: verbalized understanding  HOME EXERCISE PROGRAM: To be initiated next 1-2 visits  GOALS: Goals reviewed with patient? Yes  SHORT TERM GOALS: Target date: 01/31/2023  Pt will be independent with HEP in order to improve strength and balance in order to decrease fall risk and improve function at home and work.  Baseline: Eval- Patient presents with no formal HEP in place Goal status: INITIAL   LONG TERM GOALS: Target date: 03/14/2023 ASSESSMENT:  1.  Patient (> 46 years old) will complete five times sit to stand test in < 15 seconds indicating an increased LE strength and improved balance. Baseline: EVAL= 9.65 sec Goal status: Progressing   2.  Patient will increase FOTO score to equal to or greater than  62   to demonstrate statistically significant improvement in mobility and quality of life.  Baseline: EVAL= 57 Goal status: INITIAL   3.  Patient will increase Berg Balance score by > 4 points to demonstrate decreased fall risk during functional activities. Baseline:  Eval= 48/56 Goal status: INITIAL     CLINICAL IMPRESSION: Commenced balance training interventions, targeting proprioceptive and vestibular aspects. Pt is quite risk averse at  times, but willing to trust author when solicited to do so. Pt is motivated to advance his progress as able. No home based activities have been issued yet, will continue to explore more activities to find the best fit. He will benefit from skilled PT services to improve his dynamic standing balance and reduce risk of falling.   OBJECTIVE IMPAIRMENTS: Abnormal gait, decreased activity tolerance, decreased balance, decreased cognition, decreased mobility, difficulty walking, and decreased strength.   ACTIVITY LIMITATIONS: carrying, lifting, bending, standing, and squatting  PARTICIPATION LIMITATIONS: cleaning, shopping, community activity, and yard work  PERSONAL FACTORS: 1 comorbidity: TBI  are also affecting patient's functional outcome.   REHAB POTENTIAL: Good  CLINICAL DECISION MAKING: Stable/uncomplicated  EVALUATION COMPLEXITY: Low  PLAN:  PT FREQUENCY: 1-2x/week  PT DURATION: 12 weeks  PLANNED INTERVENTIONS: Therapeutic exercises, Therapeutic activity, Neuromuscular re-education, Balance training, Gait training, Patient/Family education, Self Care, Joint mobilization, Joint manipulation, Stair training, Vestibular training, Canalith repositioning, Dry Needling, Electrical stimulation, Spinal manipulation, Spinal mobilization, Cryotherapy, Moist heat, Taping, Manual therapy, and Re-evaluation  PLAN FOR NEXT SESSION: Initiate balance HEP as appropriate.    Jamill Wetmore C, PT 12/29/2022, 10:28 AM  10:28 AM, 12/29/22 Rosamaria Lints, PT, DPT Physical Therapist - [facility]  Outpatient Physical Therapy- Main Campus [phone]

## 2023-01-01 ENCOUNTER — Ambulatory Visit: Payer: PPO | Admitting: Physical Therapy

## 2023-01-05 ENCOUNTER — Ambulatory Visit: Payer: PPO | Attending: Neurology

## 2023-01-05 DIAGNOSIS — R278 Other lack of coordination: Secondary | ICD-10-CM | POA: Insufficient documentation

## 2023-01-05 DIAGNOSIS — M6281 Muscle weakness (generalized): Secondary | ICD-10-CM | POA: Insufficient documentation

## 2023-01-05 DIAGNOSIS — R2689 Other abnormalities of gait and mobility: Secondary | ICD-10-CM | POA: Insufficient documentation

## 2023-01-05 DIAGNOSIS — R2681 Unsteadiness on feet: Secondary | ICD-10-CM | POA: Insufficient documentation

## 2023-01-05 DIAGNOSIS — R269 Unspecified abnormalities of gait and mobility: Secondary | ICD-10-CM | POA: Diagnosis not present

## 2023-01-05 DIAGNOSIS — R262 Difficulty in walking, not elsewhere classified: Secondary | ICD-10-CM | POA: Diagnosis not present

## 2023-01-05 NOTE — Therapy (Signed)
OUTPATIENT PHYSICAL THERAPY NEURO   Patient Name: Justin Fox MRN: [id number] DOB:[birth year], 62 y.o., male Today's Date: 01/05/2023   PCP: Dr.  Marlou Sa- Roxan Hockey REFERRING PROVIDER: D. Hemang Shah  END OF SESSION:  PT End of Session - 01/05/23 0931     Visit Number 3    Number of Visits 24    Date for PT Re-Evaluation 03/14/23    Authorization Type [employer]    Authorization Time Period 12/20/22-03/14/23    Progress Note Due on Visit 10    PT Start Time 0931    PT Stop Time 1010    PT Time Calculation (min) 39 min    Equipment Utilized During Treatment Gait belt    Activity Tolerance Patient tolerated treatment well    Behavior During Therapy WFL for tasks assessed/performed             Past Medical History:  Diagnosis Date   Alcohol dependence (HCC) 01/28/2015   Avoid any potentially addictive meds.     Benign neoplasm of descending colon    Benign neoplasm of sigmoid colon    Closed displaced comminuted fracture of shaft of left radius    Colles' fracture of left radius    Fracture of face bones (HCC)    Hypertension    Radial styloid fracture    Past Surgical History:  Procedure Laterality Date   COLONOSCOPY WITH PROPOFOL N/A 10/12/2015   Procedure: COLONOSCOPY WITH PROPOFOL;  Surgeon: Midge Minium, MD;  Location: [facility] ENDOSCOPY;  Service: Endoscopy;  Laterality: N/A;   NO PAST SURGERIES     Patient Active Problem List   Diagnosis Date Noted   Gait disturbance 11/30/2022   Pure hypercholesterolemia 11/09/2022   High risk medication use 06/08/2022   History of smoking greater than 50 pack years 06/08/2022   Annual physical exam 06/08/2022   Encounter for annual wellness exam in Medicare patient 06/07/2022   NSAID long-term use 03/15/2022   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   Right rotator cuff tendonitis 07/19/2016   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, damage, 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) 12/27/2015   Dysphagia    Urinary retention    Slow transit constipation    Benign essential HTN 01/28/2015   Genital warts 01/28/2015   Insomnia 01/28/2015    ONSET DATE: worse over past several months  REFERRING DIAG: R26.89 (ICD-10-CM) - Imbalance   THERAPY DIAG:  Abnormality of gait and mobility  Difficulty in walking, not elsewhere classified  Muscle weakness (generalized)  Other abnormalities of gait and mobility  Other lack of coordination  Unsteadiness on feet  Rationale for Evaluation and Treatment: Rehabilitation  SUBJECTIVE:  SUBJECTIVE STATEMENT: Patient reports compliant with HEP including trying SLS     Pt accompanied by: Self  PERTINENT HISTORY:  Justin Fox is a 62yoM who is referred to [facility] for new imbalance, presents to [facility] OPPT 12/20/22 for evaluation. Pt Hx of brain injury 2017 after fall from ladder, bilat wrist fractures, facial fractures.. Pt also seen by PCP in July 2024 with acute mid back pain with radiation into the calf, resolved with naproxen, acetaminophen. Previously seen in September 2023 for Right hip pain, trochanteric bursitis.  Pt familiar to this clinic from services in 2018. PAIN:  Are you having pain? No  PRECAUTIONS: Fall  RED FLAGS: None   WEIGHT BEARING RESTRICTIONS: No  FALLS: Has patient fallen in last 6 months? Yes. Number of falls 1  PATIENT GOALS: Want to improve my balance  OBJECTIVE:    TODAY'S TREATMENT:                                                                                                                              DATE: 01/05/23  -STS x10,  hands free  VC for technique -Step tap onto 6" block from  floor x 10  -Step tap onto  6" block from airex pad x 20 reps - dynamic high knee march on airex pad x 20 reps without UE  -Static standing  on airex pad with horizontal head turns x 20 (VC to perform slowly)  Ambulation in hallway with horizontal Head turning - calling out items on sticky notes on left/right wall- Min unsteadiness - improved after 3 rounds - with > 80% accuracy.  SLS- attempted several trials each LE (up to 10 sec at best) Dynamic standing (tandem- front LE on 6" block and back leg on airex pad) hold 30 sec      PATIENT EDUCATION: Education details: PT plan of care, purpose of functional outcome measures Person educated: Patient and Spouse Education method: Explanation Education comprehension: verbalized understanding  HOME EXERCISE PROGRAM: To be initiated next 1-2 visits  GOALS: Goals reviewed with patient? Yes  SHORT TERM GOALS: Target date: 01/31/2023  Pt will be independent with HEP in order to improve strength and balance in order to decrease fall risk and improve function at home and work.  Baseline: Eval- Patient presents with no formal HEP in place Goal status: INITIAL   LONG TERM GOALS: Target date: 03/14/2023   1.  Patient (> 29 years old) will complete five times sit to stand test in < 15 seconds indicating an increased LE strength and improved balance. Baseline: EVAL= 9.65 sec Goal status: Progressing   2.  Patient will increase FOTO score to equal to or greater than  62   to demonstrate statistically significant improvement in mobility and quality of life.  Baseline: EVAL= 57 Goal status: INITIAL   3.  Patient will increase Berg Balance score by > 4 points to demonstrate decreased fall risk during functional activities. Baseline: Eval= 48/56  Goal status: INITIAL   ASSESSMENT:  CLINICAL IMPRESSION: Paitent presented with goo motivation. Still apprehensive with new balance activities but adapted and performed well overall. He was  responsive to verbal cues today did demo some in session progress with all dynamic balance with improved steadiness and reaction.  He will benefit from skilled PT services to improve his dynamic standing balance and reduce risk of falling.   OBJECTIVE IMPAIRMENTS: Abnormal gait, decreased activity tolerance, decreased balance, decreased cognition, decreased mobility, difficulty walking, and decreased strength.   ACTIVITY LIMITATIONS: carrying, lifting, bending, standing, and squatting  PARTICIPATION LIMITATIONS: cleaning, shopping, community activity, and yard work  PERSONAL FACTORS: 1 comorbidity: TBI  are also affecting patient's functional outcome.   REHAB POTENTIAL: Good  CLINICAL DECISION MAKING: Stable/uncomplicated  EVALUATION COMPLEXITY: Low  PLAN:  PT FREQUENCY: 1-2x/week  PT DURATION: 12 weeks  PLANNED INTERVENTIONS: Therapeutic exercises, Therapeutic activity, Neuromuscular re-education, Balance training, Gait training, Patient/Family education, Self Care, Joint mobilization, Joint manipulation, Stair training, Vestibular training, Canalith repositioning, Dry Needling, Electrical stimulation, Spinal manipulation, Spinal mobilization, Cryotherapy, Moist heat, Taping, Manual therapy, and Re-evaluation  PLAN FOR NEXT SESSION: continue with dynamic balance training     Lenda Kelp, PT 01/05/2023, 11:11 AM  11:11 AM, 01/05/23 Louis Meckel, PT Physical Therapist - [facility]  Outpatient Physical Therapy- Main Campus [phone]

## 2023-01-12 ENCOUNTER — Encounter: Payer: Self-pay | Admitting: Physical Therapy

## 2023-01-12 ENCOUNTER — Ambulatory Visit: Payer: PPO | Admitting: Physical Therapy

## 2023-01-12 DIAGNOSIS — R269 Unspecified abnormalities of gait and mobility: Secondary | ICD-10-CM

## 2023-01-12 DIAGNOSIS — R2689 Other abnormalities of gait and mobility: Secondary | ICD-10-CM

## 2023-01-12 DIAGNOSIS — M6281 Muscle weakness (generalized): Secondary | ICD-10-CM

## 2023-01-12 DIAGNOSIS — R2681 Unsteadiness on feet: Secondary | ICD-10-CM

## 2023-01-12 DIAGNOSIS — R262 Difficulty in walking, not elsewhere classified: Secondary | ICD-10-CM

## 2023-01-12 NOTE — Therapy (Signed)
OUTPATIENT PHYSICAL THERAPY NEURO   Patient Name: Justin Fox MRN: [id number] DOB:10-27-1960, 62 y.o., male Today's Date: 01/12/2023   PCP: Dr.  Marlou Sa- Roxan Hockey REFERRING PROVIDER: D. Hemang Shah  END OF SESSION:  PT End of Session - 01/12/23 1113     Visit Number 4    Number of Visits 24    Date for PT Re-Evaluation 03/14/23    Authorization Type [employer]    Authorization Time Period 12/20/22-03/14/23    Progress Note Due on Visit 10    PT Start Time 0931    PT Stop Time 1014    PT Time Calculation (min) 43 min    Equipment Utilized During Treatment Gait belt    Activity Tolerance Patient tolerated treatment well    Behavior During Therapy WFL for tasks assessed/performed              Past Medical History:  Diagnosis Date   Alcohol dependence (HCC) 01/28/2015   Avoid any potentially addictive meds.     Benign neoplasm of descending colon    Benign neoplasm of sigmoid colon    Closed displaced comminuted fracture of shaft of left radius    Colles' fracture of left radius    Fracture of face bones (HCC)    Hypertension    Radial styloid fracture    Past Surgical History:  Procedure Laterality Date   COLONOSCOPY WITH PROPOFOL N/A 10/12/2015   Procedure: COLONOSCOPY WITH PROPOFOL;  Surgeon: Midge Minium, MD;  Location: [facility] ENDOSCOPY;  Service: Endoscopy;  Laterality: N/A;   NO PAST SURGERIES     Patient Active Problem List   Diagnosis Date Noted   Gait disturbance 11/30/2022   Pure hypercholesterolemia 11/09/2022   High risk medication use 06/08/2022   History of smoking greater than 50 pack years 06/08/2022   Annual physical exam 06/08/2022   Encounter for annual wellness exam in Medicare patient 06/07/2022   NSAID long-term use 03/15/2022   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   Right rotator cuff tendonitis 07/19/2016   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, damage, 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) 12/27/2015   Dysphagia    Urinary retention    Slow transit constipation    Benign essential HTN 01/28/2015   Genital warts 01/28/2015   Insomnia 01/28/2015    ONSET DATE: worse over past several months  REFERRING DIAG: R26.89 (ICD-10-CM) - Imbalance   THERAPY DIAG:  Abnormality of gait and mobility  Difficulty in walking, not elsewhere classified  Muscle weakness (generalized)  Other abnormalities of gait and mobility  Unsteadiness on feet  Rationale for Evaluation and Treatment: Rehabilitation  SUBJECTIVE:  SUBJECTIVE STATEMENT: Patient reports he feels his balance has been improving since he started physical therapy and is happy with his progress.    Pt accompanied by: Self  PERTINENT HISTORY:  Justin Fox is a 62yoM who is referred to [facility] for new imbalance, presents to [facility] OPPT 12/20/22 for evaluation. Pt Hx of brain injury 2017 after fall from ladder, bilat wrist fractures, facial fractures.. Pt also seen by PCP in July 2024 with acute mid back pain with radiation into the calf, resolved with naproxen, acetaminophen. Previously seen in September 2023 for Right hip pain, trochanteric bursitis.  Pt familiar to this clinic from services in 2018. PAIN:  Are you having pain? No  PRECAUTIONS: Fall  RED FLAGS: None   WEIGHT BEARING RESTRICTIONS: No  FALLS: Has patient fallen in last 6 months? Yes. Number of falls 1  PATIENT GOALS: Want to improve my balance  OBJECTIVE:    TODAY'S TREATMENT:                                                                                                                              DATE: 01/12/23  -Step on / off airex x 10 ea LE,  cues for proper foot choice  - standing on airex shooting basketball in bin x 5 min, squatting and reaching to the right for each - walking on airex then to airex beam then another airex then turn around and repeat. -x 10 ea direction   -same with side stepping x 5 rounds each direction   Activity Description: ant and lateral blaze taps  Activity Setting:  The Blaze Pod Random setting was chosen to enhance cognitive processing and agility, providing an unpredictable environment to simulate real-world scenarios, and fostering quick reactions and adaptability.   Number of Pods:  4 Cycles/Sets:  2 Duration (Time or Hit Count):  1 min        PATIENT EDUCATION: Education details: PT plan of care, purpose of functional outcome measures Person educated: Patient and Spouse Education method: Explanation Education comprehension: verbalized understanding  HOME EXERCISE PROGRAM: To be initiated next 1-2 visits  GOALS: Goals reviewed with patient? Yes  SHORT TERM GOALS: Target date: 01/31/2023  Pt will be independent with HEP in order to improve strength and balance in order to decrease fall risk and improve function at home and work.  Baseline: Eval- Patient presents with no formal HEP in place Goal status: INITIAL   LONG TERM GOALS: Target date: 03/14/2023   1.  Patient (> 45 years old) will complete five times sit to stand test in < 15 seconds indicating an increased LE strength and improved balance. Baseline: EVAL= 9.65 sec Goal status: Progressing   2.  Patient will increase FOTO score to equal to or greater than  62   to demonstrate statistically significant improvement in mobility and quality of life.  Baseline: EVAL= 57 Goal status: INITIAL   3.  Patient will increase Berg Balance score  by > 4 points to demonstrate decreased fall risk during functional activities. Baseline: Eval= 48/56 Goal status: INITIAL   ASSESSMENT:  CLINICAL IMPRESSION:  Patient presented with  good motivation.  Pt is verbose but pleasant. Patient progresses with static and dynamic balance interventions this session with good overall results.  Patient initially hesitant with activities but with prolonged exposure and practice patient did improve with all of the balance activities performed.  He will benefit from skilled PT services to improve his dynamic standing balance and reduce risk of falling.   OBJECTIVE IMPAIRMENTS: Abnormal gait, decreased activity tolerance, decreased balance, decreased cognition, decreased mobility, difficulty walking, and decreased strength.   ACTIVITY LIMITATIONS: carrying, lifting, bending, standing, and squatting  PARTICIPATION LIMITATIONS: cleaning, shopping, community activity, and yard work  PERSONAL FACTORS: 1 comorbidity: TBI  are also affecting patient's functional outcome.   REHAB POTENTIAL: Good  CLINICAL DECISION MAKING: Stable/uncomplicated  EVALUATION COMPLEXITY: Low  PLAN:  PT FREQUENCY: 1-2x/week  PT DURATION: 12 weeks  PLANNED INTERVENTIONS: Therapeutic exercises, Therapeutic activity, Neuromuscular re-education, Balance training, Gait training, Patient/Family education, Self Care, Joint mobilization, Joint manipulation, Stair training, Vestibular training, Canalith repositioning, Dry Needling, Electrical stimulation, Spinal manipulation, Spinal mobilization, Cryotherapy, Moist heat, Taping, Manual therapy, and Re-evaluation  PLAN FOR NEXT SESSION: continue with dynamic balance training     Norman Herrlich, PT 01/12/2023, 11:13 AM  11:13 AM, 01/12/23

## 2023-01-19 ENCOUNTER — Ambulatory Visit: Payer: PPO | Admitting: Physical Therapy

## 2023-01-23 DIAGNOSIS — R2689 Other abnormalities of gait and mobility: Secondary | ICD-10-CM | POA: Diagnosis not present

## 2023-01-23 DIAGNOSIS — Z85828 Personal history of other malignant neoplasm of skin: Secondary | ICD-10-CM | POA: Diagnosis not present

## 2023-01-23 DIAGNOSIS — D2272 Melanocytic nevi of left lower limb, including hip: Secondary | ICD-10-CM | POA: Diagnosis not present

## 2023-01-23 DIAGNOSIS — D2271 Melanocytic nevi of right lower limb, including hip: Secondary | ICD-10-CM | POA: Diagnosis not present

## 2023-01-23 DIAGNOSIS — F5105 Insomnia due to other mental disorder: Secondary | ICD-10-CM | POA: Diagnosis not present

## 2023-01-23 DIAGNOSIS — L570 Actinic keratosis: Secondary | ICD-10-CM | POA: Diagnosis not present

## 2023-01-23 DIAGNOSIS — D485 Neoplasm of uncertain behavior of skin: Secondary | ICD-10-CM | POA: Diagnosis not present

## 2023-01-23 DIAGNOSIS — L821 Other seborrheic keratosis: Secondary | ICD-10-CM | POA: Diagnosis not present

## 2023-01-23 DIAGNOSIS — Z87820 Personal history of traumatic brain injury: Secondary | ICD-10-CM | POA: Diagnosis not present

## 2023-01-23 DIAGNOSIS — D225 Melanocytic nevi of trunk: Secondary | ICD-10-CM | POA: Diagnosis not present

## 2023-01-23 DIAGNOSIS — D2261 Melanocytic nevi of right upper limb, including shoulder: Secondary | ICD-10-CM | POA: Diagnosis not present

## 2023-01-23 DIAGNOSIS — C44612 Basal cell carcinoma of skin of right upper limb, including shoulder: Secondary | ICD-10-CM | POA: Diagnosis not present

## 2023-01-23 DIAGNOSIS — C44629 Squamous cell carcinoma of skin of left upper limb, including shoulder: Secondary | ICD-10-CM | POA: Diagnosis not present

## 2023-01-23 DIAGNOSIS — F409 Phobic anxiety disorder, unspecified: Secondary | ICD-10-CM | POA: Diagnosis not present

## 2023-01-23 DIAGNOSIS — S069XAS Unspecified intracranial injury with loss of consciousness status unknown, sequela: Secondary | ICD-10-CM | POA: Diagnosis not present

## 2023-01-23 DIAGNOSIS — R4189 Other symptoms and signs involving cognitive functions and awareness: Secondary | ICD-10-CM | POA: Diagnosis not present

## 2023-01-23 DIAGNOSIS — D2262 Melanocytic nevi of left upper limb, including shoulder: Secondary | ICD-10-CM | POA: Diagnosis not present

## 2023-01-26 ENCOUNTER — Ambulatory Visit: Payer: PPO | Admitting: Physical Therapy

## 2023-01-26 DIAGNOSIS — M6281 Muscle weakness (generalized): Secondary | ICD-10-CM

## 2023-01-26 DIAGNOSIS — R262 Difficulty in walking, not elsewhere classified: Secondary | ICD-10-CM

## 2023-01-26 DIAGNOSIS — R269 Unspecified abnormalities of gait and mobility: Secondary | ICD-10-CM

## 2023-01-26 DIAGNOSIS — R2681 Unsteadiness on feet: Secondary | ICD-10-CM

## 2023-01-26 DIAGNOSIS — R2689 Other abnormalities of gait and mobility: Secondary | ICD-10-CM

## 2023-01-26 NOTE — Therapy (Signed)
OUTPATIENT PHYSICAL THERAPY NEURO   Patient Name: Justin Fox MRN: [id number] DOB:Dec 10, 1960, 62 y.o., male Today's Date: 01/26/2023   PCP: Dr.  Marlou Sa- Roxan Hockey REFERRING PROVIDER: D. Hemang Shah  END OF SESSION:  PT End of Session - 01/26/23 0802     Visit Number 5    Number of Visits 24    Date for PT Re-Evaluation 03/14/23    Authorization Type [employer]    Authorization Time Period 12/20/22-03/14/23    Progress Note Due on Visit 10    PT Start Time 0802    PT Stop Time 0843    PT Time Calculation (min) 41 min    Equipment Utilized During Treatment Gait belt    Activity Tolerance Patient tolerated treatment well    Behavior During Therapy WFL for tasks assessed/performed              Past Medical History:  Diagnosis Date   Alcohol dependence (HCC) 01/28/2015   Avoid any potentially addictive meds.     Benign neoplasm of descending colon    Benign neoplasm of sigmoid colon    Closed displaced comminuted fracture of shaft of left radius    Colles' fracture of left radius    Fracture of face bones (HCC)    Hypertension    Radial styloid fracture    Past Surgical History:  Procedure Laterality Date   COLONOSCOPY WITH PROPOFOL N/A 10/12/2015   Procedure: COLONOSCOPY WITH PROPOFOL;  Surgeon: Midge Minium, MD;  Location: [facility] ENDOSCOPY;  Service: Endoscopy;  Laterality: N/A;   NO PAST SURGERIES     Patient Active Problem List   Diagnosis Date Noted   Gait disturbance 11/30/2022   Pure hypercholesterolemia 11/09/2022   High risk medication use 06/08/2022   History of smoking greater than 50 pack years 06/08/2022   Annual physical exam 06/08/2022   Encounter for annual wellness exam in Medicare patient 06/07/2022   NSAID long-term use 03/15/2022   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   Right rotator cuff tendonitis 07/19/2016   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  Rationale for Evaluation and Treatment: Rehabilitation  SUBJECTIVE:  SUBJECTIVE STATEMENT: Patient reports he feels his balance has been improving since he started physical therapy and is happy with his progress.  Has been working a lot on single-leg stance.  Continued difficulty with his memory.  Reports visit with Dr. Clelia Croft went well.    Pt accompanied by: Self  PERTINENT HISTORY:  Justin Fox is a 62yoM who is referred to [facility] for new imbalance, presents to [facility] OPPT 12/20/22 for evaluation. Pt Hx of brain injury 2017 after fall from ladder, bilat wrist fractures, facial fractures.. Pt also seen by PCP in July 2024 with acute mid back pain with radiation into the calf, resolved with naproxen, acetaminophen. Previously seen in September 2023 for Right hip pain, trochanteric bursitis.  Pt familiar to this clinic from services in 2018. PAIN:  Are you having pain? No  PRECAUTIONS: Fall  RED FLAGS: None   WEIGHT BEARING RESTRICTIONS: No  FALLS: Has patient fallen in last 6 months? Yes. Number of falls 1  PATIENT GOALS: Want to improve my balance  OBJECTIVE:    TODAY'S TREATMENT:                                                                                                                              DATE: 01/26/23  NMR -Step on / off airex 2x 10 ea LE, cues for proper foot choice   -forward walking over obstacles ( hurdles, yoga block) x 5 laps  -lateral stepping over hurdles, x 5 laps  Patient reports some dizziness so orthostatic in seated blood pressure was monitored as below BP: sitting 132/81 Standing 129/86 - forward and retro walking x 5 laps, hand hold support of SPT, improved retro stepping with handhold and with practice.   Activity Description: ant and lateral blaze taps  Activity Setting:  The Blaze Pod Random setting was chosen to enhance cognitive processing and agility, providing an unpredictable environment to simulate real-world scenarios, and fostering quick reactions and adaptability.   Number of Pods:  3 Cycles/Sets:  2 ea side for total of 4  Duration (Time or Hit Count):  20 hits         PATIENT EDUCATION: Education details: PT plan of care, purpose of functional outcome measures Person educated: Patient and Spouse Education method: Explanation Education comprehension: verbalized understanding  HOME EXERCISE PROGRAM: To be initiated next 1-2 visits  GOALS: Goals reviewed with patient? Yes  SHORT TERM GOALS: Target date: 01/31/2023  Pt will be independent with HEP in order to improve strength and balance in order to decrease fall risk and improve function at home and work.  Baseline: Eval- Patient presents with no formal HEP in place Goal status: INITIAL   LONG TERM GOALS: Target date: 03/14/2023   1.  Patient (> 46 years old) will complete five times sit to stand test in < 15 seconds indicating an increased LE strength and improved balance. Baseline: EVAL= 9.65 sec Goal status: Progressing   2.  Patient  will increase FOTO score to equal to or greater than  62   to demonstrate statistically significant improvement in mobility and quality of life.  Baseline: EVAL= 57 Goal status: INITIAL   3.  Patient will increase Berg Balance score by > 4 points to demonstrate decreased fall risk during functional activities. Baseline:  Eval= 48/56 Goal status: INITIAL   ASSESSMENT:  CLINICAL IMPRESSION:  Patient presented with good motivation.  Pt is verbose but pleasant. Patient progresses with static and dynamic balance interventions this session with good overall results.  Patient initially hesitant with activities but with prolonged exposure and practice patient did improve with all of the balance activities performed.  Patient challenged with him retrowalking this date and was informed he should not practice this at home without the supervision of the therapist and he verbalized understanding of this.  Instructed to continue with his home exercises as instructed on his handout.  He will benefit from skilled PT services to improve his dynamic standing balance and reduce risk of falling.   OBJECTIVE IMPAIRMENTS: Abnormal gait, decreased activity tolerance, decreased balance, decreased cognition, decreased mobility, difficulty walking, and decreased strength.   ACTIVITY LIMITATIONS: carrying, lifting, bending, standing, and squatting  PARTICIPATION LIMITATIONS: cleaning, shopping, community activity, and yard work  PERSONAL FACTORS: 1 comorbidity: TBI  are also affecting patient's functional outcome.   REHAB POTENTIAL: Good  CLINICAL DECISION MAKING: Stable/uncomplicated  EVALUATION COMPLEXITY: Low  PLAN:  PT FREQUENCY: 1-2x/week  PT DURATION: 12 weeks  PLANNED INTERVENTIONS: Therapeutic exercises, Therapeutic activity, Neuromuscular re-education, Balance training, Gait training, Patient/Family education, Self Care, Joint mobilization, Joint manipulation, Stair training, Vestibular training, Canalith repositioning, Dry Needling, Electrical stimulation, Spinal manipulation, Spinal mobilization, Cryotherapy, Moist heat, Taping, Manual therapy, and Re-evaluation  PLAN FOR NEXT SESSION: continue with dynamic balance training     Norman Herrlich, PT 01/26/2023, 8:03 AM  8:03 AM, 01/26/23

## 2023-01-29 ENCOUNTER — Ambulatory Visit: Payer: PPO

## 2023-01-31 ENCOUNTER — Ambulatory Visit: Payer: PPO

## 2023-02-01 DIAGNOSIS — R42 Dizziness and giddiness: Secondary | ICD-10-CM | POA: Diagnosis not present

## 2023-02-01 DIAGNOSIS — J310 Chronic rhinitis: Secondary | ICD-10-CM | POA: Diagnosis not present

## 2023-02-01 NOTE — Therapy (Signed)
OUTPATIENT PHYSICAL THERAPY NEURO   Patient Name: Justin Fox MRN: [id number] DOB:01-03-1961, 62 y.o., male Today's Date: 02/02/2023   PCP: Dr.  Marlou Sa- Roxan Hockey REFERRING PROVIDER: D. Hemang Shah  END OF SESSION:  PT End of Session - 02/02/23 0753     Visit Number 6    Number of Visits 24    Date for PT Re-Evaluation 03/14/23    Authorization Type [employer]    Authorization Time Period 12/20/22-03/14/23    Progress Note Due on Visit 10    PT Start Time 0800    PT Stop Time 0843    PT Time Calculation (min) 43 min    Equipment Utilized During Treatment Gait belt    Activity Tolerance Patient tolerated treatment well    Behavior During Therapy WFL for tasks assessed/performed               Past Medical History:  Diagnosis Date   Alcohol dependence (HCC) 01/28/2015   Avoid any potentially addictive meds.     Benign neoplasm of descending colon    Benign neoplasm of sigmoid colon    Closed displaced comminuted fracture of shaft of left radius    Colles' fracture of left radius    Fracture of face bones (HCC)    Hypertension    Radial styloid fracture    Past Surgical History:  Procedure Laterality Date   COLONOSCOPY WITH PROPOFOL N/A 10/12/2015   Procedure: COLONOSCOPY WITH PROPOFOL;  Surgeon: Midge Minium, MD;  Location: [facility] ENDOSCOPY;  Service: Endoscopy;  Laterality: N/A;   NO PAST SURGERIES     Patient Active Problem List   Diagnosis Date Noted   Gait disturbance 11/30/2022   Pure hypercholesterolemia 11/09/2022   High risk medication use 06/08/2022   History of smoking greater than 50 pack years 06/08/2022   Annual physical exam 06/08/2022   Encounter for annual wellness exam in Medicare patient 06/07/2022   NSAID long-term use 03/15/2022   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   Right rotator cuff tendonitis 07/19/2016   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, damage, 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) 12/27/2015   Dysphagia    Urinary retention    Slow transit constipation    Benign essential HTN 01/28/2015   Genital warts 01/28/2015   Insomnia 01/28/2015    ONSET DATE: worse over past several months  REFERRING DIAG: R26.89 (ICD-10-CM) - Imbalance   THERAPY DIAG:  Abnormality of gait and mobility  Muscle weakness (generalized)  Other abnormalities of gait and mobility  Difficulty in walking, not elsewhere classified  Unsteadiness on feet  Other lack of coordination  Rationale for Evaluation and Treatment: Rehabilitation  SUBJECTIVE:  SUBJECTIVE STATEMENT:  Patient reports he went to the ENT yesterday (Dr. Willeen Cass) to be assessed for inner ear but MD thinks it is balance related to brain injury.     Pt accompanied by: Self  PERTINENT HISTORY:  Justin Fox is a 62yoM who is referred to [facility] for new imbalance, presents to [facility] OPPT 12/20/22 for evaluation. Pt Hx of brain injury 2017 after fall from ladder, bilat wrist fractures, facial fractures.. Pt also seen by PCP in July 2024 with acute mid back pain with radiation into the calf, resolved with naproxen, acetaminophen. Previously seen in September 2023 for Right hip pain, trochanteric bursitis.  Pt familiar to this clinic from services in 2018. PAIN:  Are you having pain? No  PRECAUTIONS: Fall  RED FLAGS: None   WEIGHT BEARING RESTRICTIONS: No  FALLS: Has patient fallen in last 6 months? Yes. Number of falls 1  PATIENT GOALS: Want to improve my balance  OBJECTIVE:    TODAY'S TREATMENT:                                                                                                                               DATE: 02/02/23   NMR -Step on / off airex 2x 10 ea LE, cues for proper foot choice  -forward walking over obstacles ( hurdles, yoga block) x 5 laps  -lateral stepping over hurdles, x 5 laps -Alternating cone taps L/R 2 x 10 each LE - single UE support  - forward and retro walking x 5 laps with no UE support -Lateral stepping in // bars x 5 laps with light UE support   PATIENT EDUCATION: Education details: PT plan of care, purpose of functional outcome measures Person educated: Patient and Spouse Education method: Explanation Education comprehension: verbalized understanding  HOME EXERCISE PROGRAM: To be initiated next 1-2 visits  GOALS: Goals reviewed with patient? Yes  SHORT TERM GOALS: Target date: 01/31/2023  Pt will be independent with HEP in order to improve strength and balance in order to decrease fall risk and improve function at home and work.  Baseline: Eval- Patient presents with no formal HEP in place Goal status: INITIAL   LONG TERM GOALS: Target date: 03/14/2023   1.  Patient (> 51 years old) will complete five times sit to stand test in < 15 seconds indicating an increased LE strength and improved balance. Baseline: EVAL= 9.65 sec Goal status: Progressing   2.  Patient will increase FOTO score to equal to or greater than  62   to demonstrate statistically significant improvement in mobility and quality of life.  Baseline: EVAL= 57 Goal status: INITIAL   3.  Patient will increase Berg Balance score by > 4 points to demonstrate decreased fall risk during functional activities. Baseline: Eval= 48/56 Goal status: INITIAL   ASSESSMENT:  CLINICAL IMPRESSION:   Patient arrives to treatment session motivated to participate. Continues to progress with static and dynamic balance activities. Continues request use  of UE support for comfort as patient challenged with single leg activities. Improved retrowalking this date but continues to require CGA-supervision. He  will benefit from skilled PT services to improve his dynamic standing balance and reduce risk of falling.   OBJECTIVE IMPAIRMENTS: Abnormal gait, decreased activity tolerance, decreased balance, decreased cognition, decreased mobility, difficulty walking, and decreased strength.   ACTIVITY LIMITATIONS: carrying, lifting, bending, standing, and squatting  PARTICIPATION LIMITATIONS: cleaning, shopping, community activity, and yard work  PERSONAL FACTORS: 1 comorbidity: TBI  are also affecting patient's functional outcome.   REHAB POTENTIAL: Good  CLINICAL DECISION MAKING: Stable/uncomplicated  EVALUATION COMPLEXITY: Low  PLAN:  PT FREQUENCY: 1-2x/week  PT DURATION: 12 weeks  PLANNED INTERVENTIONS: Therapeutic exercises, Therapeutic activity, Neuromuscular re-education, Balance training, Gait training, Patient/Family education, Self Care, Joint mobilization, Joint manipulation, Stair training, Vestibular training, Canalith repositioning, Dry Needling, Electrical stimulation, Spinal manipulation, Spinal mobilization, Cryotherapy, Moist heat, Taping, Manual therapy, and Re-evaluation  PLAN FOR NEXT SESSION: continue with dynamic balance training     Viviann Spare, PT, DPT 02/02/2023, 7:54 AM  7:54 AM, 02/02/23

## 2023-02-02 ENCOUNTER — Ambulatory Visit: Payer: PPO | Attending: Neurology

## 2023-02-02 DIAGNOSIS — R2689 Other abnormalities of gait and mobility: Secondary | ICD-10-CM | POA: Insufficient documentation

## 2023-02-02 DIAGNOSIS — R262 Difficulty in walking, not elsewhere classified: Secondary | ICD-10-CM | POA: Diagnosis not present

## 2023-02-02 DIAGNOSIS — R2681 Unsteadiness on feet: Secondary | ICD-10-CM | POA: Insufficient documentation

## 2023-02-02 DIAGNOSIS — R269 Unspecified abnormalities of gait and mobility: Secondary | ICD-10-CM | POA: Diagnosis not present

## 2023-02-02 DIAGNOSIS — M6281 Muscle weakness (generalized): Secondary | ICD-10-CM | POA: Insufficient documentation

## 2023-02-02 DIAGNOSIS — R278 Other lack of coordination: Secondary | ICD-10-CM | POA: Diagnosis not present

## 2023-02-07 ENCOUNTER — Ambulatory Visit: Payer: PPO | Admitting: Physical Therapy

## 2023-02-09 ENCOUNTER — Ambulatory Visit: Payer: PPO

## 2023-02-14 ENCOUNTER — Ambulatory Visit: Payer: PPO

## 2023-02-15 ENCOUNTER — Other Ambulatory Visit: Payer: Self-pay | Admitting: Physical Medicine & Rehabilitation

## 2023-02-15 DIAGNOSIS — R269 Unspecified abnormalities of gait and mobility: Secondary | ICD-10-CM

## 2023-02-15 DIAGNOSIS — S069XAS Unspecified intracranial injury with loss of consciousness status unknown, sequela: Secondary | ICD-10-CM

## 2023-02-16 ENCOUNTER — Ambulatory Visit: Payer: PPO | Admitting: Physical Therapy

## 2023-02-16 DIAGNOSIS — R262 Difficulty in walking, not elsewhere classified: Secondary | ICD-10-CM

## 2023-02-16 DIAGNOSIS — R2689 Other abnormalities of gait and mobility: Secondary | ICD-10-CM

## 2023-02-16 DIAGNOSIS — M6281 Muscle weakness (generalized): Secondary | ICD-10-CM

## 2023-02-16 DIAGNOSIS — R269 Unspecified abnormalities of gait and mobility: Secondary | ICD-10-CM

## 2023-02-16 DIAGNOSIS — R2681 Unsteadiness on feet: Secondary | ICD-10-CM

## 2023-02-16 MED ORDER — METHYLPHENIDATE HCL ER (OSM) 36 MG PO TBCR
36.0000 mg | EXTENDED_RELEASE_TABLET | Freq: Every day | ORAL | 0 refills | Status: DC
Start: 2023-02-16 — End: 2023-03-22

## 2023-02-16 NOTE — Therapy (Signed)
OUTPATIENT PHYSICAL THERAPY NEURO   Patient Name: Justin Fox MRN: [id number] DOB:1960/11/21, 62 y.o., male Today's Date: 02/16/2023   PCP: Dr.  Marlou Sa- Roxan Hockey REFERRING PROVIDER: D. Hemang Shah  END OF SESSION:  PT End of Session - 02/16/23 0921     Visit Number 7    Number of Visits 24    Date for PT Re-Evaluation 03/14/23    Authorization Type [employer]    Authorization Time Period 12/20/22-03/14/23    Progress Note Due on Visit 10    PT Start Time 0924    PT Stop Time 1007    PT Time Calculation (min) 43 min    Equipment Utilized During Treatment Gait belt    Activity Tolerance Patient tolerated treatment well    Behavior During Therapy WFL for tasks assessed/performed                Past Medical History:  Diagnosis Date   Alcohol dependence (HCC) 01/28/2015   Avoid any potentially addictive meds.     Benign neoplasm of descending colon    Benign neoplasm of sigmoid colon    Closed displaced comminuted fracture of shaft of left radius    Colles' fracture of left radius    Fracture of face bones (HCC)    Hypertension    Radial styloid fracture    Past Surgical History:  Procedure Laterality Date   COLONOSCOPY WITH PROPOFOL N/A 10/12/2015   Procedure: COLONOSCOPY WITH PROPOFOL;  Surgeon: Midge Minium, MD;  Location: [facility] ENDOSCOPY;  Service: Endoscopy;  Laterality: N/A;   NO PAST SURGERIES     Patient Active Problem List   Diagnosis Date Noted   Gait disturbance 11/30/2022   Pure hypercholesterolemia 11/09/2022   High risk medication use 06/08/2022   History of smoking greater than 50 pack years 06/08/2022   Annual physical exam 06/08/2022   Encounter for annual wellness exam in Medicare patient 06/07/2022   NSAID long-term use 03/15/2022   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   Right rotator cuff tendonitis 07/19/2016   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  Rationale for Evaluation and Treatment: Rehabilitation  SUBJECTIVE:  SUBJECTIVE STATEMENT:   Patient reports he had a laceration to his hand that he went to ED for. It is healing well and him and his wife are managing it. He cut it with metal shrapnel when cutting metal.     Pt accompanied by: Self  PERTINENT HISTORY:  Justin Fox is a 62yoM who is referred to [facility] for new imbalance, presents to [facility] OPPT 12/20/22 for evaluation. Pt Hx of brain injury 2017 after fall from ladder, bilat wrist fractures, facial fractures.. Pt also seen by PCP in July 2024 with acute mid back pain with radiation into the calf, resolved with naproxen, acetaminophen. Previously seen in September 2023 for Right hip pain, trochanteric bursitis.  Pt familiar to this clinic from services in 2018. PAIN:  Are you having pain? No  PRECAUTIONS: Fall  RED FLAGS: None   WEIGHT BEARING RESTRICTIONS: No  FALLS: Has patient fallen in last 6 months? Yes. Number of falls 1  PATIENT GOALS: Want to improve my balance  OBJECTIVE:    TODAY'S TREATMENT:                                                                                                                              DATE: 02/16/23   NMR - Adducted stance on airex x 30 sec  -staggered stance on airex and step 2 x 30 sec ea LE -Step on / off airex x 10 laterally,  challenged to not use Les   -x 10 ea LE anterior with step through pattern practice  -lateral stepping over hurdles, x 5 laps -Lateral stepping in // bars x 5 laps with light UE support  -Forward and retro walking over hurdles x 5 laps with no UE support Resisted gait with 3# AW with intermittent retro walking transitions x 200 ft    Activity Description: ant, post and lateral blaze taps with pods on ea side of patient and pt tapping accordingly to side. 3#AW donned.  Activity Setting:  The Blaze Pod Random setting was chosen to enhance cognitive processing and agility, providing an unpredictable environment to simulate real-world scenarios, and fostering quick reactions and adaptability.    Number of Pods:  6 Cycles/Sets:  3 Duration (Time or Hit Count):  25 hits   PATIENT EDUCATION: Education details: PT plan of care, purpose of functional outcome measures Person educated: Patient and Spouse Education method: Explanation Education comprehension: verbalized understanding  HOME EXERCISE PROGRAM: To be initiated next 1-2 visits  GOALS: Goals reviewed with patient? Yes  SHORT TERM GOALS: Target date: 01/31/2023  Pt will be independent with HEP in order to improve strength and balance in order to decrease fall risk and improve function at home and work.  Baseline: Eval- Patient presents with no formal HEP in place Goal status: INITIAL   LONG TERM GOALS: Target date: 03/14/2023   1.  Patient (> 87 years old) will complete five times sit to stand test in < 15 seconds  indicating an increased LE strength and improved balance. Baseline: EVAL= 9.65 sec Goal status: Progressing   2.  Patient will increase FOTO score to equal to or greater than  62   to demonstrate statistically significant improvement in mobility and quality of life.  Baseline: EVAL= 57 Goal status: INITIAL   3.  Patient will increase Berg Balance score by > 4 points to demonstrate decreased fall risk during  functional activities. Baseline: Eval= 48/56 Goal status: INITIAL   ASSESSMENT:  CLINICAL IMPRESSION:   Patient arrives to treatment session motivated to participate. Continues to progress with static and dynamic balance activities.  Improved retrowalking this date with addition of AW but continues to require CGA-supervision. Pt showing improved confidence with many balance activities but has to be encouraged to participate in more challenging activities secondary to fear of unsteadiness. He will benefit from skilled PT services to improve his dynamic standing balance and reduce risk of falling.   OBJECTIVE IMPAIRMENTS: Abnormal gait, decreased activity tolerance, decreased balance, decreased cognition, decreased mobility, difficulty walking, and decreased strength.   ACTIVITY LIMITATIONS: carrying, lifting, bending, standing, and squatting  PARTICIPATION LIMITATIONS: cleaning, shopping, community activity, and yard work  PERSONAL FACTORS: 1 comorbidity: TBI  are also affecting patient's functional outcome.   REHAB POTENTIAL: Good  CLINICAL DECISION MAKING: Stable/uncomplicated  EVALUATION COMPLEXITY: Low  PLAN:  PT FREQUENCY: 1-2x/week  PT DURATION: 12 weeks  PLANNED INTERVENTIONS: Therapeutic exercises, Therapeutic activity, Neuromuscular re-education, Balance training, Gait training, Patient/Family education, Self Care, Joint mobilization, Joint manipulation, Stair training, Vestibular training, Canalith repositioning, Dry Needling, Electrical stimulation, Spinal manipulation, Spinal mobilization, Cryotherapy, Moist heat, Taping, Manual therapy, and Re-evaluation  PLAN FOR NEXT SESSION: continue with dynamic balance training     Norman Herrlich, PT, DPT 02/16/2023, 9:21 AM  9:21 AM, 02/16/23

## 2023-02-18 ENCOUNTER — Ambulatory Visit
Admission: RE | Admit: 2023-02-18 | Discharge: 2023-02-18 | Disposition: A | Discharge status: Home / Self Care | Payer: PPO | Source: Ambulatory Visit | Attending: Family Medicine | Admitting: Family Medicine

## 2023-02-18 VITALS — BP 141/88 | HR 69 | Temp 98.6°F | Resp 15 | Ht 75.0 in | Wt 212.1 lb

## 2023-02-18 DIAGNOSIS — Z4802 Encounter for removal of sutures: Secondary | ICD-10-CM

## 2023-02-18 NOTE — Discharge Instructions (Addendum)
Please return if you develop any redness, swelling, pus drainage, or fever.  Do not apply any significant force with your left hand until after the wound has fully healed and the scab has fallen off.

## 2023-02-18 NOTE — ED Triage Notes (Signed)
Patient had sutures placed in his left hand on 02/08/23 at [facility] ED.  Patient states that his left hand has had some redness and swelling.

## 2023-02-18 NOTE — ED Provider Notes (Signed)
[facility]    CSN: [id number] Arrival date & time: 02/18/23  1403      History   Chief Complaint Chief Complaint  Patient presents with   Suture / Staple Removal    Appointment- Provider Visit    HPI Justin Fox is a 62 y.o. male.   HPI  62 year old male with a past medical history significant for TBI, hypertension, and alcohol dependence presents for evaluation for suture removal.  He was seen at [facility] on 02/08/2023 after sustaining a laceration to the back of his left hand with a piece of metal.  He had 6 sutures placed and was discharged home on oral antibiotics.  He came in today to have the sutures removed.  He denies any fever or drainage from the wound.  Past Medical History:  Diagnosis Date   Alcohol dependence (HCC) 01/28/2015   Avoid any potentially addictive meds.     Benign neoplasm of descending colon    Benign neoplasm of sigmoid colon    Closed displaced comminuted fracture of shaft of left radius    Colles' fracture of left radius    Fracture of face bones (HCC)    Hypertension    Radial styloid fracture     Patient Active Problem List   Diagnosis Date Noted   Gait disturbance 11/30/2022   Pure hypercholesterolemia 11/09/2022   High risk medication use 06/08/2022   History of smoking greater than 50 pack years 06/08/2022   Annual physical exam 06/08/2022   Encounter for annual wellness exam in Medicare patient 06/07/2022   NSAID long-term use 03/15/2022   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   Right rotator cuff tendonitis 07/19/2016   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, damage, 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 Authorizing Provider  amLODipine (NORVASC) 5 MG tablet Take 1 tablet (5 mg total) by mouth daily. 11/09/22   Simmons-Robinson, Tawanna Cooler, MD  ascorbic acid (VITAMIN C) 100 MG tablet Take 100 mg by mouth daily. Reported on 08/11/2015    [provider]  B Complex-C (SUPER B COMPLEX PO) Take 1 tablet by mouth daily.    [provider]  Garlic 1500 MG CAPS Take 1 capsule by mouth daily.     [provider]  lamoTRIgine (LAMICTAL) 25 MG tablet Take 50 mg by mouth 2 (two) times daily.    [provider]  Melatonin 10 MG CAPS Take by mouth. Taking 15 mg    [provider]  Methylcellulose, Laxative, 500 MG TABS Take 1 tablet by mouth every evening.    [provider]  methylphenidate (CONCERTA) 36 MG PO CR tablet Take 1 tablet (36 mg total) by mouth daily. 12/27/22 12/27/23  Ranelle Oyster, MD  methylphenidate (CONCERTA) 36 MG PO CR  tablet Take 1 tablet (36 mg total) by mouth daily. 02/16/23   Ranelle Oyster, MD  Misc Natural Products (GLUCOSAMINE CHONDROITIN TRIPLE) TABS Take 2 tablets by mouth daily.    [provider]  Multiple Vitamin tablet Take 1 tablet by mouth daily.     [provider]  naproxen (NAPROSYN) 500 MG tablet Take by mouth as needed.    [provider]  Omega-3 Fatty Acids (FISH OIL ULTRA) 1400 MG CAPS Take 1 capsule by mouth daily.    [provider]  QUEtiapine (SEROQUEL) 100 MG tablet Take 1 tablet by mouth at bedtime. 08/11/21 08/11/22  [provider]  rosuvastatin (CRESTOR) 10 MG tablet Take 1 tablet (10 mg total) by mouth  daily. 11/09/22   Simmons-Robinson, Tawanna Cooler, MD  vitamin E 400 UNIT capsule Take 400 Units by mouth daily.    [provider]    Family History Family History  Problem Relation Age of Onset   Anxiety disorder Mother    Thyroid disease Mother     Social History Social History   Tobacco Use   Smoking status: Former    Current packs/day: 0.00    Average packs/day: 1.5 packs/day for 35.0 years (52.5 ttl pk-yrs)    Types: Cigarettes    Start date: 04/03/1973    Quit date: 04/03/2008    Years since quitting: 14.8   Smokeless tobacco: Never   Tobacco comments:    Has been quit for 7-8 years  Vaping Use   Vaping status: Never Used  Substance Use Topics   Alcohol use: No    Comment: Has had issues with this in the past. Has not drank in 7-8 years.   Drug use: No     Allergies   Patient has no known allergies.   Review of Systems Review of Systems  Constitutional:  Negative for fever.  Skin:  Positive for wound. Negative for color change.     Physical Exam Triage Vital Signs ED Triage Vitals [02/18/23 1437]  Encounter Vitals Group     BP      Systolic BP Percentile      Diastolic BP Percentile      Pulse      Resp      Temp      Temp src      SpO2      Weight 212 lb 1.3 oz (96.2 kg)     Height 6\' 3"  (1.905 m)     Head Circumference      Peak Flow      Pain Score 1     Pain Loc      Pain Education      Exclude from Growth Chart    No data found.  Updated Vital Signs BP (!) 141/88 (BP Location: Right Arm)   Pulse 69   Temp 98.6 F (37 C) (Oral)   Resp 15   Ht 6\' 3"  (1.905 m)   Wt 212 lb 1.3 oz (96.2 kg)   SpO2 96%   BMI 26.51 kg/m   Visual Acuity Right Eye Distance:   Left Eye Distance:   Bilateral Distance:    Right Eye Near:   Left Eye Near:    Bilateral Near:     Physical Exam Vitals and nursing note reviewed.  Constitutional:      Appearance: Normal appearance. He is not ill-appearing.  HENT:     Head: Normocephalic and  atraumatic.  Musculoskeletal:  General: Signs of injury present.  Skin:    General: Skin is warm and dry.     Capillary Refill: Capillary refill takes less than 2 seconds.     Findings: Erythema present.  Neurological:     General: No focal deficit present.     Mental Status: He is alert and oriented to person, place, and time.      UC Treatments / Results  Labs (all labs ordered are listed, but only abnormal results are displayed) Labs Reviewed - No data to display  EKG   Radiology No results found.  Procedures Procedures (including critical care time)  Medications Ordered in UC Medications - No data to display  Initial Impression / Assessment and Plan / UC Course  I have reviewed the triage vital signs and the nursing notes.  Pertinent labs & imaging results that were available during my care of the patient were reviewed by me and considered in my medical decision making (see chart for details).   Patient is a pleasant, nontoxic-appearing 62 year old male presenting for suture removal from a laceration to his left hand that occurred 10 days ago.  As you can see the image above, there is mild erythema surrounding the edge of the wound but it is not hot to touch.  The sutures are intact.  I cleansed the wound with alcohol and removed 6 intact Prolene sutures.  I advised the patient to keep the area clean and dry but he does not need to cover it with a dressing.  He should avoid any force application using his left hand until after the scab has fallen off and the wound has fully healed.  Final Clinical Impressions(s) / UC Diagnoses   Final diagnoses:  Encounter for removal of sutures     Discharge Instructions      Please return if you develop any redness, swelling, pus drainage, or fever.  Do not apply any significant force with your left hand until after the wound has fully healed and the scab has fallen off.     ED Prescriptions   None    PDMP not  reviewed this encounter.   Becky Augusta, NP 02/18/23 1450

## 2023-02-19 ENCOUNTER — Ambulatory Visit: Payer: PPO

## 2023-02-21 ENCOUNTER — Ambulatory Visit: Payer: PPO

## 2023-02-23 ENCOUNTER — Ambulatory Visit: Payer: PPO | Admitting: Physical Therapy

## 2023-02-23 DIAGNOSIS — R278 Other lack of coordination: Secondary | ICD-10-CM

## 2023-02-23 DIAGNOSIS — R262 Difficulty in walking, not elsewhere classified: Secondary | ICD-10-CM

## 2023-02-23 DIAGNOSIS — R269 Unspecified abnormalities of gait and mobility: Secondary | ICD-10-CM | POA: Diagnosis not present

## 2023-02-23 DIAGNOSIS — M6281 Muscle weakness (generalized): Secondary | ICD-10-CM

## 2023-02-23 DIAGNOSIS — R2689 Other abnormalities of gait and mobility: Secondary | ICD-10-CM

## 2023-02-23 DIAGNOSIS — R2681 Unsteadiness on feet: Secondary | ICD-10-CM

## 2023-02-23 NOTE — Therapy (Signed)
OUTPATIENT PHYSICAL THERAPY NEURO   Patient Name: Justin Fox MRN: [id number] DOB:[birth year], 62 y.o., male Today's Date: 02/23/2023   PCP: Dr.  Marlou Sa- Roxan Hockey REFERRING PROVIDER: D. Hemang Shah  END OF SESSION:  PT End of Session - 02/23/23 0758     Visit Number 8    Number of Visits 24    Date for PT Re-Evaluation 03/14/23    Authorization Type [employer]    Authorization Time Period 12/20/22-03/14/23    Progress Note Due on Visit 10    PT Start Time 0804    PT Stop Time 0845    PT Time Calculation (min) 41 min    Equipment Utilized During Treatment Gait belt    Activity Tolerance Patient tolerated treatment well    Behavior During Therapy WFL for tasks assessed/performed                Past Medical History:  Diagnosis Date   Alcohol dependence (HCC) 01/28/2015   Avoid any potentially addictive meds.     Benign neoplasm of descending colon    Benign neoplasm of sigmoid colon    Closed displaced comminuted fracture of shaft of left radius    Colles' fracture of left radius    Fracture of face bones (HCC)    Hypertension    Radial styloid fracture    Past Surgical History:  Procedure Laterality Date   COLONOSCOPY WITH PROPOFOL N/A 10/12/2015   Procedure: COLONOSCOPY WITH PROPOFOL;  Surgeon: Midge Minium, MD;  Location: [facility] ENDOSCOPY;  Service: Endoscopy;  Laterality: N/A;   NO PAST SURGERIES     Patient Active Problem List   Diagnosis Date Noted   Gait disturbance 11/30/2022   Pure hypercholesterolemia 11/09/2022   High risk medication use 06/08/2022   History of smoking greater than 50 pack years 06/08/2022   Annual physical exam 06/08/2022   Encounter for annual wellness exam in Medicare patient 06/07/2022   NSAID long-term use 03/15/2022   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   Right rotator cuff tendonitis 07/19/2016   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, damage, 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) 12/27/2015   Dysphagia    Urinary retention    Slow transit constipation    Benign essential HTN 01/28/2015   Genital warts 01/28/2015   Insomnia 01/28/2015    ONSET DATE: worse over past several months  REFERRING DIAG: R26.89 (ICD-10-CM) - Imbalance   THERAPY DIAG:  Abnormality of gait and mobility  Muscle weakness (generalized)  Other abnormalities of gait and mobility  Difficulty in walking, not elsewhere classified  Unsteadiness on feet  Other lack of coordination  Rationale for Evaluation and Treatment: Rehabilitation  SUBJECTIVE:  SUBJECTIVE STATEMENT:   Patient reports that his hand is healing well, no new pain. No other new medical issues. Feels like Thayer Ohm and Trey Paula have helped improve his balance greatly over the last 2 months.  Pt accompanied by: Self  PERTINENT HISTORY:  Alize Veech is a 62yoM who is referred to [facility] for new imbalance, presents to [facility] OPP[facility] 12/20/22 for evaluation. Pt Hx of brain injury 2017 after fall from ladder, bilat wrist fractures, facial fractures.. Pt also seen by PCP in July 2024 with acute mid back pain with radiation into the calf, resolved with naproxen, acetaminophen. Previously seen in September 2023 for Right hip pain, trochanteric bursitis.  Pt familiar to this clinic from services in 2018. PAIN:  Are you having pain? No  PRECAUTIONS: Fall  RED FLAGS: None   WEIGHT BEARING RESTRICTIONS: No  FALLS: Has patient fallen in last 6 months? Yes. Number of falls 1  PATIENT GOALS: Want to improve my balance  OBJECTIVE:    TODAY'S TREATMENT:                                                                                                                               DATE: 02/23/23   NMR  Nustep BLE/BUE reciprocal movement endurance training x 5 min level 5   On airex pad - standing on airex normal BOS x 30 sec - ball toss to self x 45 sec  - ball toss off wall 2 x 20  - lateral ball toss off wall x 30 bil  - blaze pods Random 6 pods, 3 on floor 3 on wall, 2 x 1 min  - Blaze pods focus 6 pods, 2 distractors, 3 on floor 3 on wall, 2 x 1 min   Stair management without UE support x 12 with cues for reciprocal movement patterns.   Throughout session PT provided supervision assist from PT with intermittent CGA with mild posterior LOB with ball toss, but able to correct with min cues for use of hip strategy.   PATIENT EDUCATION: Education details: PT plan of care, purpose of functional outcome measures. Pt educated throughout session about proper posture and technique with exercises. Improved exercise technique, movement at target joints, use of target muscles after min to mod verbal, visual, tactile cues.  Person educated: Patient and Spouse Education method: Explanation Education comprehension: verbalized understanding  HOME EXERCISE PROGRAM: To be initiated next 1-2 visits  GOALS: Goals reviewed with patient? Yes  SHORT TERM GOALS: Target date: 01/31/2023  Pt will be independent with HEP in order to improve strength and balance in order to decrease fall risk and improve function at home and work.  Baseline: Eval- Patient presents with no formal HEP in place Goal status: INITIAL   LONG TERM GOALS: Target date: 03/14/2023   1.  Patient (> 13 years old) will complete five times sit to stand test in < 15 seconds indicating an increased LE strength and improved balance. Baseline:  EVAL= 9.65 sec Goal status: Progressing   2.  Patient will increase FOTO score to equal to or greater than  62   to demonstrate statistically significant improvement in mobility and quality of life.  Baseline: EVAL= 57 Goal status:  INITIAL   3.  Patient will increase Berg Balance score by > 4 points to demonstrate decreased fall risk during functional activities. Baseline: Eval= 48/56 Goal status: INITIAL   ASSESSMENT:  CLINICAL IMPRESSION:   Patient arrives to treatment session motivated to participate. Continues to progress with static and dynamic balance activities.  Intermittent UE support with SLS for foot tap on blaze pods, but improved with increased repetitions. Pt was able to perform blaze pods with UE and LE distractors without being limited by change in color of target pod. Pt feels like he is doing much better since restarting PT 2 months ago. He will benefit from skilled PT services to improve his dynamic standing balance and reduce risk of falling.   OBJECTIVE IMPAIRMENTS: Abnormal gait, decreased activity tolerance, decreased balance, decreased cognition, decreased mobility, difficulty walking, and decreased strength.   ACTIVITY LIMITATIONS: carrying, lifting, bending, standing, and squatting  PARTICIPATION LIMITATIONS: cleaning, shopping, community activity, and yard work  PERSONAL FACTORS: 1 comorbidity: TBI  are also affecting patient's functional outcome.   REHAB POTENTIAL: Good  CLINICAL DECISION MAKING: Stable/uncomplicated  EVALUATION COMPLEXITY: Low  PLAN:  PT FREQUENCY: 1-2x/week  PT DURATION: 12 weeks  PLANNED INTERVENTIONS: Therapeutic exercises, Therapeutic activity, Neuromuscular re-education, Balance training, Gait training, Patient/Family education, Self Care, Joint mobilization, Joint manipulation, Stair training, Vestibular training, Canalith repositioning, Dry Needling, Electrical stimulation, Spinal manipulation, Spinal mobilization, Cryotherapy, Moist heat, Taping, Manual therapy, and Re-evaluation  PLAN FOR NEXT SESSION: continue with dynamic balance training     Golden Pop, PT, DPT 02/23/2023, 8:01 AM  8:01 AM, 02/23/23

## 2023-02-26 ENCOUNTER — Ambulatory Visit: Payer: PPO

## 2023-02-26 ENCOUNTER — Ambulatory Visit
Admission: RE | Admit: 2023-02-26 | Discharge: 2023-02-26 | Disposition: A | Discharge status: Home / Self Care | Payer: PPO | Source: Ambulatory Visit | Attending: Acute Care | Admitting: Acute Care

## 2023-02-26 DIAGNOSIS — Z87891 Personal history of nicotine dependence: Secondary | ICD-10-CM | POA: Diagnosis not present

## 2023-02-26 DIAGNOSIS — Z122 Encounter for screening for malignant neoplasm of respiratory organs: Secondary | ICD-10-CM | POA: Insufficient documentation

## 2023-02-28 ENCOUNTER — Ambulatory Visit: Payer: PPO

## 2023-03-05 ENCOUNTER — Ambulatory Visit: Payer: PPO

## 2023-03-07 ENCOUNTER — Ambulatory Visit: Payer: PPO
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
## 2023-03-09 ENCOUNTER — Ambulatory Visit: Payer: PPO | Attending: Neurology

## 2023-03-09 DIAGNOSIS — R2689 Other abnormalities of gait and mobility: Secondary | ICD-10-CM | POA: Insufficient documentation

## 2023-03-09 DIAGNOSIS — R278 Other lack of coordination: Secondary | ICD-10-CM | POA: Diagnosis not present

## 2023-03-09 DIAGNOSIS — M6281 Muscle weakness (generalized): Secondary | ICD-10-CM | POA: Diagnosis not present

## 2023-03-09 DIAGNOSIS — R269 Unspecified abnormalities of gait and mobility: Secondary | ICD-10-CM | POA: Insufficient documentation

## 2023-03-09 DIAGNOSIS — R262 Difficulty in walking, not elsewhere classified: Secondary | ICD-10-CM | POA: Diagnosis not present

## 2023-03-09 DIAGNOSIS — R2681 Unsteadiness on feet: Secondary | ICD-10-CM | POA: Diagnosis not present

## 2023-03-12 ENCOUNTER — Ambulatory Visit: Payer: PPO | Admitting: Physical Therapy

## 2023-03-14 ENCOUNTER — Ambulatory Visit: Payer: PPO | Admitting: Physical Therapy

## 2023-03-14 DIAGNOSIS — C44629 Squamous cell carcinoma of skin of left upper limb, including shoulder: Secondary | ICD-10-CM | POA: Diagnosis not present

## 2023-03-16 ENCOUNTER — Ambulatory Visit: Payer: PPO | Admitting: Physical Therapy

## 2023-03-19 ENCOUNTER — Ambulatory Visit: Payer: PPO | Admitting: Physical Therapy

## 2023-03-21 ENCOUNTER — Ambulatory Visit: Payer: PPO | Admitting: Physical Therapy

## 2023-03-22 ENCOUNTER — Other Ambulatory Visit: Payer: Self-pay | Admitting: Physical Medicine & Rehabilitation

## 2023-03-22 DIAGNOSIS — R4189 Other symptoms and signs involving cognitive functions and awareness: Secondary | ICD-10-CM

## 2023-03-22 DIAGNOSIS — R269 Unspecified abnormalities of gait and mobility: Secondary | ICD-10-CM

## 2023-03-23 ENCOUNTER — Ambulatory Visit: Payer: PPO | Admitting: Physical Therapy

## 2023-03-23 ENCOUNTER — Encounter: Payer: Self-pay | Admitting: Family Medicine

## 2023-03-23 ENCOUNTER — Telehealth: Payer: Self-pay | Admitting: Acute Care

## 2023-03-23 DIAGNOSIS — Z122 Encounter for screening for malignant neoplasm of respiratory organs: Secondary | ICD-10-CM

## 2023-03-23 DIAGNOSIS — Z87891 Personal history of nicotine dependence: Secondary | ICD-10-CM

## 2023-03-23 DIAGNOSIS — I2510 Atherosclerotic heart disease of native coronary artery without angina pectoris: Secondary | ICD-10-CM | POA: Insufficient documentation

## 2023-03-23 DIAGNOSIS — J439 Emphysema, unspecified: Secondary | ICD-10-CM | POA: Insufficient documentation

## 2023-03-23 DIAGNOSIS — I719 Aortic aneurysm of unspecified site, without rupture: Secondary | ICD-10-CM | POA: Insufficient documentation

## 2023-03-23 NOTE — Telephone Encounter (Signed)
Called patient's number and spoke with pt's wife (on [place]) regarding results. We discussed each finding, patient will have annual screening scan, patient takes BP medication and has routine follow ups with PCP, patient is taking a statin and has routine follow ups regarding cholesterol, patient is already aware of emphysema. Discussed the finding of ILD, pulm consult scheduled with Dr. Marchelle Gearing on 05/24/2023 for eval. ILD packet mailed to patient. Pt's wife verbalized understanding and will relay information to patient.     IMPRESSION: 1. Lung-RADS 2, benign appearance or behavior. Continue annual screening with low-dose chest CT without contrast in 12 months. 2. 4.0 cm ascending aortic aneurysm. Recommend annual imaging followup by CTA or MRA. This recommendation follows 2010 ACCF/AHA/AATS/ACR/ASA/SCA/SCAI/SIR/STS/SVM Guidelines for the Diagnosis and Management of Patients with Thoracic Aortic Disease. Circulation. 2010; 121: X914-N829. Aortic aneurysm NOS (ICD10-I71.9). 3. Age advanced left anterior descending coronary artery calcification. 4. Mild central bronchiectasis. 5. Mild basilar subpleural ground-glass, unchanged. If there is concern for interstitial lung disease, high-resolution chest CT without contrast, to include prone imaging, recommended. 6.  Aortic atherosclerosis (ICD10-I70.0). 7.  Emphysema (ICD10-J43.9).     Electronically Signed   By: Leanna Battles M.D.   On: 03/19/2023 12:57

## 2023-03-29 MED ORDER — METHYLPHENIDATE HCL ER (OSM) 36 MG PO TBCR
36.0000 mg | EXTENDED_RELEASE_TABLET | Freq: Every day | ORAL | 0 refills | Status: DC
Start: 2023-03-29 — End: 2023-04-23

## 2023-03-30 ENCOUNTER — Ambulatory Visit: Payer: PPO | Admitting: Physical Therapy

## 2023-04-23 ENCOUNTER — Other Ambulatory Visit: Payer: Self-pay | Admitting: Physical Medicine & Rehabilitation

## 2023-04-23 DIAGNOSIS — R269 Unspecified abnormalities of gait and mobility: Secondary | ICD-10-CM

## 2023-04-23 DIAGNOSIS — R4189 Other symptoms and signs involving cognitive functions and awareness: Secondary | ICD-10-CM

## 2023-04-24 MED ORDER — METHYLPHENIDATE HCL ER (OSM) 36 MG PO TBCR
36.0000 mg | EXTENDED_RELEASE_TABLET | Freq: Every day | ORAL | 0 refills | Status: DC
Start: 2023-04-24 — End: 2023-05-25

## 2023-05-24 ENCOUNTER — Ambulatory Visit: Payer: PPO | Admitting: Internal Medicine

## 2023-05-24 ENCOUNTER — Encounter: Payer: Self-pay | Admitting: Internal Medicine

## 2023-05-24 VITALS — BP 135/81 | HR 69 | Temp 97.6°F | Ht 73.0 in | Wt 221.0 lb

## 2023-05-24 DIAGNOSIS — Z87891 Personal history of nicotine dependence: Secondary | ICD-10-CM

## 2023-05-24 DIAGNOSIS — R918 Other nonspecific abnormal finding of lung field: Secondary | ICD-10-CM

## 2023-05-24 DIAGNOSIS — R9389 Abnormal findings on diagnostic imaging of other specified body structures: Secondary | ICD-10-CM
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
## 2023-05-24 NOTE — Patient Instructions (Addendum)
ICD-10-CM   1. Abnormal CT of the chest  R93.89     2. Ground glass opacity present on imaging of lung  R91.8     3. Former smoker  Z87.891       I can definitely say this interstitial lung abnormality (ILA) on the CT scan May 2024 in November 2024.  However I am unable to say if there is interstitial lung disease.  Also because the changes in November 2025 look improved compared to May 2024 and also the CT scan findings based on low-dose CT scan of the chest and her current asymptomatic state.  Current bird feather pillow and chicken coop exposures are risk factors for this getting worse.  Plan - Do blood work for ANA rheumatoid factor QuantiFERON gold and cyclic Citrulline peptide - Continue monitoring approach  -Do full pulmonary function test in 3-4 months  -Do high-resolution CT chest in November 2025 -Avoid chicken coop exposure -Get rid of feather pillow. - mask around sick people and crowded places  Follow-up - Return to see Dr. Marchelle Gearing in a 15-minute visit in 3-4 months but after pulmonary function test.

## 2023-05-25 ENCOUNTER — Other Ambulatory Visit: Payer: Self-pay | Admitting: Physical Medicine & Rehabilitation

## 2023-05-25 DIAGNOSIS — S069XAS Unspecified intracranial injury with loss of consciousness status unknown, sequela: Secondary | ICD-10-CM

## 2023-05-25 DIAGNOSIS — R269 Unspecified abnormalities of gait and mobility: Secondary | ICD-10-CM

## 2023-05-25 MED ORDER — METHYLPHENIDATE HCL ER (OSM) 36 MG PO TBCR
36.0000 mg | EXTENDED_RELEASE_TABLET | Freq: Every day | ORAL | 0 refills | Status: DC
Start: 2023-05-25 — End: 2023-06-20

## 2023-05-27 LAB — QUANTIFERON-TB GOLD PLUS
Mitogen-NIL: >10 [IU]/mL
NIL: 0.15 [IU]/mL
QuantiFERON-TB Gold Plus: POSITIVE — AB
TB1-NIL: 3.57 [IU]/mL
TB2-NIL: 3.48 [IU]/mL

## 2023-06-05 ENCOUNTER — Ambulatory Visit (INDEPENDENT_AMBULATORY_CARE_PROVIDER_SITE_OTHER): Payer: Self-pay

## 2023-06-05 VITALS — BP 138/84 | Ht 73.0 in | Wt 216.9 lb

## 2023-06-05 DIAGNOSIS — Z0000 Encounter for general adult medical examination without abnormal findings: Secondary | ICD-10-CM | POA: Diagnosis not present
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
## 2023-06-05 NOTE — Patient Instructions (Addendum)
 Justin Fox , Thank you for taking time to come for your Medicare Wellness Visit. I appreciate your ongoing commitment to your health goals. Please review the following plan we discussed and let me know if I can assist you in the future.   Referrals/Orders/Follow-Ups/Clinician Recommendations: NONE  This is a list of the screening recommended for you and due dates:  Health Maintenance  Topic Date Due   Pneumococcal Vaccination (1 of 2 - PCV) Never done   Zoster (Shingles) Vaccine (1 of 2) Never done   COVID-19 Vaccine (3 - Moderna risk series) 08/16/2019   Flu Shot  07/02/2023*   Medicare Annual Wellness Visit  06/04/2024   Colon Cancer Screening  10/11/2025   DTaP/Tdap/Td vaccine (2 - Td or Tdap) 07/11/2026   Hepatitis C Screening  Completed   HIV Screening  Completed   HPV Vaccine  Aged Out   Screening for Lung Cancer  Discontinued  *Topic was postponed. The date shown is not the original due date.    Advanced directives: (ACP Link)Information on Advanced Care Planning can be found at [facility] of [place] Advance Health Care Directives Advance Health Care Directives ([email or web address]   Next Medicare Annual Wellness Visit scheduled for next year: Yes   06/10/24 @ 1:50 PM IN PERSON

## 2023-06-20 ENCOUNTER — Encounter: Payer: PPO | Attending: Physical Medicine & Rehabilitation | Admitting: Physical Medicine & Rehabilitation

## 2023-06-20 ENCOUNTER — Encounter: Payer: Self-pay | Admitting: Physical Medicine & Rehabilitation

## 2023-06-20 VITALS — BP 131/76 | HR 66 | Ht 73.0 in | Wt 215.2 lb

## 2023-06-20 DIAGNOSIS — M7711 Lateral epicondylitis, right elbow: Secondary | ICD-10-CM | POA: Diagnosis not present

## 2023-06-20 DIAGNOSIS — S069XAS Unspecified intracranial injury with loss of consciousness status unknown, sequela: Secondary | ICD-10-CM | POA: Diagnosis not present

## 2023-06-20 DIAGNOSIS — R4189 Other symptoms and signs involving cognitive functions and awareness: Secondary | ICD-10-CM | POA: Diagnosis present

## 2023-06-20 DIAGNOSIS — R269 Unspecified abnormalities of gait and mobility: Secondary | ICD-10-CM | POA: Diagnosis not present

## 2023-06-20 MED ORDER — METHYLPHENIDATE HCL ER (OSM) 36 MG PO TBCR
36.0000 mg | EXTENDED_RELEASE_TABLET | Freq: Every day | ORAL | 0 refills | Status: DC
Start: 2023-06-20 — End: 2023-08-30

## 2023-06-20 MED ORDER — METHYLPHENIDATE HCL ER (OSM) 36 MG PO TBCR: 36.0000 mg | EXTENDED_RELEASE_TABLET | Freq: Every day | ORAL | 0 refills | Status: DC

## 2023-06-20 NOTE — Patient Instructions (Signed)
 FOREARM BAND LIKE I SHOWED YOU. WEAR WHILE YOU'RE WORKING OR AS NEEDED  ICE FOR PAIN 30 MINUTES 3 X DAILY  NAPROXEN 220MG  TWICE DAILY FOR A WEEK (WITH FOOD)  TAKE IT EASY.    ALWAYS FEEL FREE TO CALL OUR OFFICE WITH ANY PROBLEMS OR QUESTIONS [phone])  **PLEASE NOTE** ALL MEDICATION REFILL REQUESTS (INCLUDING CONTROLLED SUBSTANCES) NEED TO BE MADE AT LEAST 7 DAYS PRIOR TO REFILL BEING DUE. ANY REFILL REQUESTS INSIDE THAT TIME FRAME MAY RESULT IN DELAYS IN RECEIVING YOUR PRESCRIPTION.
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
## 2023-08-30 ENCOUNTER — Ambulatory Visit: Payer: Self-pay | Admitting: Internal Medicine

## 2023-08-30 ENCOUNTER — Encounter: Payer: Self-pay | Admitting: *Deleted

## 2023-08-30 ENCOUNTER — Telehealth: Payer: Self-pay | Admitting: *Deleted

## 2023-08-30 DIAGNOSIS — R269 Unspecified abnormalities of gait and mobility: Secondary | ICD-10-CM

## 2023-08-30 DIAGNOSIS — R4189 Other symptoms and signs involving cognitive functions and awareness: Secondary | ICD-10-CM

## 2023-08-30 MED ORDER — METHYLPHENIDATE HCL ER (OSM) 36 MG PO TBCR: 36.0000 mg | EXTENDED_RELEASE_TABLET | Freq: Every day | ORAL | 0 refills | Status: DC

## 2023-08-30 MED ORDER — METHYLPHENIDATE HCL ER (OSM) 36 MG PO TBCR
36.0000 mg | EXTENDED_RELEASE_TABLET | Freq: Every day | ORAL | 0 refills | Status: DC
Start: 2023-08-30 — End: 2023-10-26

## 2023-08-30 NOTE — Telephone Encounter (Signed)
 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

## 2023-08-30 NOTE — Telephone Encounter (Signed)
 Mrs Heckmann called to request refill on Tommy's methylphenidate .

## 2023-08-30 NOTE — Telephone Encounter (Signed)
Rxs written and sent to the pharmacy. Thanks!  

## 2023-09-04 ENCOUNTER — Emergency Department
Admission: EM | Admit: 2023-09-04 | Discharge: 2023-09-04 | Disposition: A | Discharge status: Home / Self Care | Attending: Emergency Medicine | Admitting: Emergency Medicine

## 2023-09-04 ENCOUNTER — Emergency Department

## 2023-09-04 DIAGNOSIS — Y92410 Unspecified street and highway as the place of occurrence of the external cause: Secondary | ICD-10-CM | POA: Insufficient documentation

## 2023-09-04 DIAGNOSIS — R519 Headache, unspecified: Secondary | ICD-10-CM | POA: Diagnosis not present

## 2023-09-04 DIAGNOSIS — R071 Chest pain on breathing: Secondary | ICD-10-CM | POA: Diagnosis not present

## 2023-09-04 DIAGNOSIS — S42035A Nondisplaced fracture of lateral end of left clavicle, initial encounter for closed fracture: Secondary | ICD-10-CM | POA: Diagnosis not present

## 2023-09-04 DIAGNOSIS — M25512 Pain in left shoulder: Secondary | ICD-10-CM | POA: Diagnosis present

## 2023-09-04 MED ORDER — OXYCODONE-ACETAMINOPHEN 5-325 MG PO TABS
1.0000 | ORAL_TABLET | Freq: Once | ORAL | Status: AC
  Administered 2023-09-04: 1 via ORAL
  Filled 2023-09-04: qty 1

## 2023-09-04 MED ORDER — OXYCODONE HCL 5 MG PO TABS: 5.0000 mg | ORAL_TABLET | Freq: Four times a day (QID) | ORAL | 0 refills | Status: AC | PRN

## 2023-09-04 MED ORDER — ONDANSETRON 4 MG PO TBDP
4.0000 mg | ORAL_TABLET | Freq: Three times a day (TID) | ORAL | 0 refills | Status: AC | PRN
Start: 2023-09-04 — End: ?
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
## 2023-09-05 NOTE — Progress Notes (Signed)
 Called the pt and there was no answer- LMTCB

## 2023-09-11 ENCOUNTER — Encounter: Payer: Self-pay | Admitting: Internal Medicine

## 2023-09-11 ENCOUNTER — Ambulatory Visit: Payer: PPO | Admitting: Internal Medicine

## 2023-09-11 VITALS — BP 140/82 | HR 70 | Ht 73.0 in | Wt 214.0 lb

## 2023-09-11 DIAGNOSIS — R918 Other nonspecific abnormal finding of lung field: Secondary | ICD-10-CM

## 2023-09-11 DIAGNOSIS — R9389 Abnormal findings on diagnostic imaging of other specified body structures: Secondary | ICD-10-CM | POA: Diagnosis not present

## 2023-09-11 DIAGNOSIS — Z87891 Personal history of nicotine dependence: Secondary | ICD-10-CM | POA: Diagnosis not present

## 2023-09-11 DIAGNOSIS — Z122 Encounter for screening for malignant neoplasm of respiratory organs: Secondary | ICD-10-CM | POA: Diagnosis not present

## 2023-09-11 DIAGNOSIS — R7612 Nonspecific reaction to cell mediated immunity measurement of gamma interferon antigen response without active tuberculosis: Secondary | ICD-10-CM | POA: Diagnosis not present

## 2023-09-11 LAB — PULMONARY FUNCTION TEST
DL/VA % pred: 90 %
DL/VA: 3.75 ml/min/mmHg/L
DLCO unc % pred: 77 %
DLCO unc: 22.96 ml/min/mmHg
FEF 25-75 Post: 4.18 L/s
FEF 25-75 Pre: 4.91 L/s
FEF2575-%Change-Post: -14 %
FEF2575-%Pred-Post: 134 %
FEF2575-%Pred-Pre: 157 %
FEV1-%Change-Post: 0 %
FEV1-%Pred-Post: 95 %
FEV1-%Pred-Pre: 95 %
FEV1-Post: 3.71 L
FEV1-Pre: 3.72 L
FEV1FVC-%Change-Post: -3 %
FEV1FVC-%Pred-Pre: 113 %
FEV6-%Change-Post: 5 %
FEV6-%Pred-Post: 91 %
FEV6-%Pred-Pre: 86 %
FEV6-Post: 4.51 L
FEV6-Pre: 4.27 L
FEV6FVC-%Change-Post: 0 %
FEV6FVC-%Pred-Post: 104 %
FEV6FVC-%Pred-Pre: 104 %
FVC-%Change-Post: 3 %
FVC-%Pred-Post: 87 %
FVC-%Pred-Pre: 84 %
FVC-Post: 4.52 L
FVC-Pre: 4.38 L
Post FEV1/FVC ratio: 82 %
Post FEV6/FVC ratio: 100 %
Pre FEV1/FVC ratio: 85 %
Pre FEV6/FVC Ratio: 100 %
RV % pred: 92 %
RV: 2.29 L
TLC % pred: 82 %
TLC: 6.27 L
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
## 2023-09-11 NOTE — Patient Instructions (Addendum)
 ICD-10-CM   1. Interstitial lung abnormality (ILA)  R91.8 Ambulatory referral to Infectious Disease    Pulmonary function test    2. Stopped smoking with greater than 40 pack year history  Z87.891 Ambulatory referral to Infectious Disease    Pulmonary function test    3. Screening for lung cancer  Z12.2 Ambulatory referral to Infectious Disease    Pulmonary function test    4. Positive QuantiFERON-TB Gold test  R76.12 Ambulatory referral to Infectious Disease    Pulmonary function test     #Interstitial lung abnormalities  -This also present on CT scan June 2025 that was done during the time of trauma - It is extremely mild. - Cause not know - Pulmonary function test June 2025 is near normal   Plan - Do spirometry and DLCO in 6 months -Next high-resolution CT chest in June 2026 [we will order at next visit - Glad you are staying away from the chicken coop and you got rid of the feather pillow  QuantiFERON gold + February 2025  -CT scan chest findings are not consistent with tuberculosis  Plan - Refer to infectious diseases at [facility] regional for eval for Latent TB   Screening for lung cancer and previous smoking history  Plan - To be captured at the time of follow-up high-resolution CT chest for interstitial lung abnormality to be done in June 2026 [I will order at next visit)  Follow-up - 15-minute visit in 6 months after PFTs

## 2023-09-11 NOTE — Patient Instructions (Signed)
 Full pft attempted today

## 2023-09-11 NOTE — Progress Notes (Signed)
 Full pft attempted today
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
## 2023-09-28 ENCOUNTER — Telehealth: Payer: Self-pay

## 2023-09-28 NOTE — Telephone Encounter (Signed)
 Pt requested a refill on all his medication.

## 2023-10-03 NOTE — Telephone Encounter (Signed)
 Pt was notifed there is a prescription at the pharmacy.

## 2023-10-09 ENCOUNTER — Ambulatory Visit: Attending: Infectious Diseases | Admitting: Infectious Diseases

## 2023-10-09 ENCOUNTER — Other Ambulatory Visit
Admission: RE | Admit: 2023-10-09 | Discharge: 2023-10-09 | Disposition: A | Discharge status: Home / Self Care | Source: Ambulatory Visit | Attending: Infectious Diseases | Admitting: Infectious Diseases

## 2023-10-09 ENCOUNTER — Encounter: Payer: Self-pay | Admitting: Infectious Diseases

## 2023-10-09 VITALS — BP 141/84 | HR 71 | Temp 97.3°F | Ht 72.0 in | Wt 216.0 lb

## 2023-10-09 DIAGNOSIS — J849 Interstitial pulmonary disease, unspecified: Secondary | ICD-10-CM | POA: Diagnosis not present

## 2023-10-09 DIAGNOSIS — R918 Other nonspecific abnormal finding of lung field: Secondary | ICD-10-CM | POA: Diagnosis present

## 2023-10-09 DIAGNOSIS — Z227 Latent tuberculosis: Secondary | ICD-10-CM | POA: Diagnosis not present

## 2023-10-09 DIAGNOSIS — S43032S Inferior subluxation of left humerus, sequela: Secondary | ICD-10-CM | POA: Diagnosis not present

## 2023-10-09 DIAGNOSIS — Z87891 Personal history of nicotine dependence: Secondary | ICD-10-CM | POA: Insufficient documentation

## 2023-10-09 DIAGNOSIS — R7612 Nonspecific reaction to cell mediated immunity measurement of gamma interferon antigen response without active tuberculosis: Secondary | ICD-10-CM | POA: Diagnosis present

## 2023-10-09 DIAGNOSIS — S069XAS Unspecified intracranial injury with loss of consciousness status unknown, sequela: Secondary | ICD-10-CM | POA: Diagnosis not present

## 2023-10-09 DIAGNOSIS — F1021 Alcohol dependence, in remission: Secondary | ICD-10-CM | POA: Insufficient documentation

## 2023-10-09 DIAGNOSIS — Z79899 Other long term (current) drug therapy: Secondary | ICD-10-CM | POA: Diagnosis not present

## 2023-10-09 DIAGNOSIS — W19XXXS Unspecified fall, sequela: Secondary | ICD-10-CM | POA: Insufficient documentation

## 2023-10-09 DIAGNOSIS — Z122 Encounter for screening for malignant neoplasm of respiratory organs: Secondary | ICD-10-CM | POA: Diagnosis present

## 2023-10-09 LAB — HIV ANTIBODY (ROUTINE TESTING W REFLEX): HIV Screen 4th Generation wRfx: NONREACTIVE

## 2023-10-09 NOTE — Addendum Note (Signed)
 Addended by: VEVA MOTTS T on: 10/09/2023 03:08 PM   Modules accepted: Orders
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
## 2023-10-09 NOTE — Patient Instructions (Signed)
 Today, you were seen for a positive Quantiferon Gold test, which checks for latent tuberculosis (TB) exposure. You have no symptoms of active TB, and your previous TB skin tests were negative. We also reviewed your history of interstitial lung disease, a recent clavicle fracture, a past brain injury, and your history of alcohol  dependence.  YOUR PLAN:  -LATENT TUBERCULOSIS: Latent TB means you have been exposed to the TB bacteria, but it is not currently active or causing symptoms. We will repeat the Quantiferon Gold test and perform a PPD skin test to confirm your TB status. Depending on the results, we will discuss potential treatment options, keeping in mind any medication interactions.  -INTERSTITIAL LUNG DISEASE: Interstitial lung disease involves scarring of the lung tissue, which can affect your breathing. Your CT scan showed some improvement, and your lung function tests are nearly normal. To reduce exposure to potential irritants, please wear a mask when woodworking or cleaning the chicken coop. Follow with pulmonologist forhigh-resolution CT scan next year.  -FRACTURE OF LEFT CLAVICLE: You have a recent fracture in your left clavicle, which is being managed by an orthopedic surgeon. No additional treatment changes were discussed today.  -BRAIN INJURY: You had a brain injury in 2017 that required rehabilitation. You are continuing to manage this with your neurologist. No additional treatment changes were discussed today.  -ALCOHOL  DEPENDENCE: You have a history of alcohol  dependence but have been sober since 2009. No additional treatment changes were discussed today.  INSTRUCTIONS:  Please follow up for the repeat Quantiferon Gold test and PPD skin test as discussed. Continue wearing a mask during woodworking and chicken coop cleaning. We will schedule a high-resolution CT scan for your lungs next year.

## 2023-10-11 LAB — QUANTIFERON-TB GOLD PLUS (RQFGPL)
QuantiFERON Mitogen Value: >10 [IU]/mL
QuantiFERON Nil Value: 0.06 [IU]/mL
QuantiFERON TB1 Ag Value: 2.65 [IU]/mL
QuantiFERON TB2 Ag Value: 3.44 [IU]/mL

## 2023-10-11 LAB — QUANTIFERON-TB GOLD PLUS: QuantiFERON-TB Gold Plus: POSITIVE — AB

## 2023-10-11 LAB — TB SKIN TEST
Induration: 7 mm
TB Skin Test: POSITIVE

## 2023-10-16 ENCOUNTER — Ambulatory Visit (LOCAL_COMMUNITY_HEALTH_CENTER): Payer: Self-pay

## 2023-10-16 DIAGNOSIS — Z111 Encounter for screening for respiratory tuberculosis: Secondary | ICD-10-CM

## 2023-10-18 NOTE — Progress Notes (Signed)
 The patient is a 63 year old male screened for TB with QFT which was positive.   EPI completed 10/16/23 with pt's wife, as pt is s/p traumatic brain injury with cognitive deficit and difficulty communicating via telephone. The patient has no signs or symptoms of TB and was advised that they are most likely not sick nor contagious with TB, but they must obtain a CXR to clearly rule out active pulmonary disease.   CXR order has been placed, no charge staff message sent to finance.   Instructions have been given to patient over the phone as well as via text including the address of outpatient radiology clinic at [facility], an explanation that this is a walk-in appointment, and to NOT go to the Emergency Room as we cannot cover the cost of any imaging done in the ED, they must go to the outpatient radiology clinic.  Will follow up CXR and offer LTBI treatment pending results.  Delon LITTIE Primrose, RN

## 2023-10-24 ENCOUNTER — Ambulatory Visit
Admission: RE | Admit: 2023-10-24 | Discharge: 2023-10-24 | Disposition: A | Discharge status: Home / Self Care | Attending: Surgery | Admitting: Surgery

## 2023-10-24 ENCOUNTER — Ambulatory Visit
Admission: RE | Admit: 2023-10-24 | Discharge: 2023-10-24 | Disposition: A | Discharge status: Home / Self Care | Source: Ambulatory Visit | Attending: Surgery | Admitting: Surgery

## 2023-10-24 DIAGNOSIS — Z111 Encounter for screening for respiratory tuberculosis: Secondary | ICD-10-CM | POA: Insufficient documentation

## 2023-10-25 ENCOUNTER — Telehealth: Payer: Self-pay | Admitting: Physical Medicine & Rehabilitation

## 2023-10-25 DIAGNOSIS — R4189 Other symptoms and signs involving cognitive functions and awareness: Secondary | ICD-10-CM

## 2023-10-25 DIAGNOSIS — R269 Unspecified abnormalities of gait and mobility: Secondary | ICD-10-CM

## 2023-10-25 NOTE — Telephone Encounter (Signed)
 Requesting refill on concerta  for 2 months, listed pharmacy.

## 2023-10-26 MED ORDER — METHYLPHENIDATE HCL ER (OSM) 36 MG PO TBCR: 36.0000 mg | EXTENDED_RELEASE_TABLET | Freq: Every day | ORAL | 0 refills | Status: DC

## 2023-10-26 MED ORDER — METHYLPHENIDATE HCL ER (OSM) 36 MG PO TBCR
36.0000 mg | EXTENDED_RELEASE_TABLET | Freq: Every day | ORAL | 0 refills | Status: DC
Start: 2023-10-26 — End: 2023-12-19

## 2023-10-26 NOTE — Telephone Encounter (Signed)
 Rx written and sent to the pharmacy. Thanks!

## 2023-10-28 ENCOUNTER — Other Ambulatory Visit: Payer: Self-pay | Admitting: Family Medicine

## 2023-10-28 DIAGNOSIS — E7800 Pure hypercholesterolemia, unspecified: Secondary | ICD-10-CM

## 2023-10-31 ENCOUNTER — Ambulatory Visit: Payer: Self-pay | Admitting: Surgery

## 2023-11-07 ENCOUNTER — Telehealth: Payer: Self-pay | Admitting: Infectious Diseases

## 2023-11-07 NOTE — Telephone Encounter (Signed)
 Left message to call me back to discuss all the test results and talk about RX for latent TB ( neg CXR, postive quant god and PPD)

## 2023-11-08 NOTE — Telephone Encounter (Signed)
 INH  would be the better option because of drug interaction with Rifampin and seroquel 

## 2023-11-12 ENCOUNTER — Telehealth: Payer: Self-pay

## 2023-11-12 DIAGNOSIS — Z227 Latent tuberculosis: Secondary | ICD-10-CM | POA: Insufficient documentation

## 2023-11-12 NOTE — Telephone Encounter (Signed)
 LTBI treatment recommendations per Dr. JINNY Leghorn and  Dr. DOROTHA Chester:  Rifapentine 900 mg and Isoniazid 900 mg once weekly x 12 weeks, or as second option Rifampin 600 mg daily x 4 months.  (Preferred treatment options with closely monitoring due to drug interactions)  Phone call to patient, I spoke with Mrs Dylon Correa, treatment options reviewed along with the additional recommendations as follows: Importance of monitoring closely for side effects since Rifampin/Rifapentine will decrease the effectiveness of some of his medications, the seizure meds being the most concerning.   Between the 2 options offered, INH/Rifapentine would be a better choice because INH may potentially increase some of the same side effects that Rifampin/Rifapentine decrease. This will be a shorter tx option.   If 3HP, plan to avoid driving for the days just after taking his meds.  Obtain labs as baseline,CBC and LFTs, as well as monthly LFT's to monitor liver function.   Patient's wife, states he is not currently taking tylenol , and has not have any event of seizures.  Taking Seroquel  and Concerta , has a f/u appointment with Dr. Babs on 12/19/2023. She agrees to schedule a visit at [facility] for LTBI tx initial visit 2 weeks prior to his follow-up appointment with Dr. Babs. TB clinic visit scheduled 12/05/2023 at 10:30 am.  Mrs. Magallanes agrees to review treatment options and recommendations with patient and PCP and/or other health care providers currently following up his care, and contact TB clinic nurse to notify tx decision.  Almarie Metro, RN

## 2023-11-13 ENCOUNTER — Other Ambulatory Visit: Payer: Self-pay | Admitting: Surgery

## 2023-11-13 DIAGNOSIS — Z227 Latent tuberculosis: Secondary | ICD-10-CM

## 2023-11-13 NOTE — Progress Notes (Signed)
 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   Diagnosis of LTBI and pertinent labs/info: +QFT: 10/09/2023 CXR: negative 10/24/2023  EPI: 10/16/2023  HIV: neg 10/09/2023  Therapy:  -Rifapentine 900 mg po WEEKLY for 12 weeks  -Isoniazid 900 mg po WEEKLY for 12 weeks -Vitamin B6/Pyridoxine 50 mg po WEEKLY for 12 weeks  Planned LTBI therapy start date: 12/05/2023  PCP: Sharma Coyer, MD  Tuberculosis treatment orders  All patients are to be monitored per [place] and county TB policies.   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 Treat for latent TB per the following:  Rifapentine 900 mg weekly x 12 doses; Isoniazid 900 mg po weekly x 12 doses; Pyridoxine 50 mg po WEEKLY for 12 weeks per Dr. Herlene.   1.) Obtain baseline CBC and LFTs 2.) Monthly LFTs throughout treatment period 3.) Only needs additional CBCs if baseline CBC is abnormal, or side effects arise during treatment such as persistent rash, itching, nosebleeds, easy bruising, nausea, vomiting.   NOTE TO NURSING:  POTENTIAL DRUG INTERACTIONS: Rifampin can decrease the effectiveness of the patient's:  Seroquel  Lamotrigine  Norvasc   All patients react differently to these medications, and it is possible that the patient will not notice any side effects or decreased effectiveness of these medications.   However, if the patient notices any of the following, they should contact us  and potentially schedule follow up with their PCP for medication adjustments while taking rifapentine and isoniazid:  Decreased effectiveness of anti-hypertensive med/____Norvasc___: -Elevated blood pressure above their baseline, patient should monitor at home if possible -Headaches, blurry vision, dizziness, light headedness  Decreased effectiveness of seizure medication__Lamotrigine__: -Sensory changes (like unusual smells, tastes, or visual disturbances), cognitive changes (such as deja vu or confusion), and emotional shifts (like fear, anxiety,  or panic), or overt seizure activity (staring spells, jerking movements, stiffening of the body, loss of consciousness, breathing problems)  Decreased effectiveness of insomnia medication__Seroquel__: -Difficulty falling asleep, restless sleep, difficulty staying asleep, early awakening, daytime sleepiness, irritability from lack of sleep  Patient on statin: -Patient needs monthly LFTs due to potential interaction between Crestor  and rifapentine/INH, causing increased liver enzymes/liver toxicity.  USE OF OVER THE COUNTER PAIN MEDICINE:   The patient is taking acetaminophen  and/or ibuprofen. If the patient uses these medications on a regular/daily basis, the patient will need monthly LFTs.  If patient declines LTBI tx, please educate patient as to the signs and symptoms of active TB and to contact the health department with any additional concerns or need for documentation.   Justin Fox. Herlene, MD

## 2023-11-28 ENCOUNTER — Encounter: Payer: Self-pay | Admitting: Physical Medicine & Rehabilitation

## 2023-11-28 ENCOUNTER — Encounter: Payer: Self-pay | Admitting: Family Medicine

## 2023-11-28 ENCOUNTER — Encounter: Payer: Self-pay | Admitting: Infectious Diseases

## 2023-11-30 ENCOUNTER — Other Ambulatory Visit: Payer: Self-pay

## 2023-11-30 NOTE — Progress Notes (Signed)
 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   Diagnosis of LTBI and pertinent labs/info: Positive QFT: 10/09/2023 CXR: 10/30/2023 negative  EPI: Completed 10/16/2023  HIV: 10/09/2023 Non-reactive Syphilis: Unknown. Screening test to be offered at initial visit.   CBC: Baseline  LFTs: Baseline    Therapy: Rifapentine 900 mg and Isoniazid 900 mg by mouth once a week x 12 weeks.   Planned LTBI therapy start date: 12/05/2023  PCP: Justin Coyer, MD.   Tuberculosis treatment orders  All patients are to be monitored per [place] and county TB policies.   _[name]_ has latent TB. Treat for latent TB per the following:  Rifapentine 900 mg and Isoniazid 900 mg by mouth once a week x 12 weeks.  Per Dr. Herlene   Baseline labs currently indicated. Needs labs if concerning symptoms arise such as persistent rash, itching, nosebleeds, easy bruising, nausea, vomiting, dark colored urine, the patient starts new potentially hepatotoxic medications, or significant alcohol  consumption is reported that would require monitoring. Almarie Metro, RN

## 2023-12-05 ENCOUNTER — Other Ambulatory Visit: Payer: Self-pay

## 2023-12-05 ENCOUNTER — Ambulatory Visit (LOCAL_COMMUNITY_HEALTH_CENTER): Payer: Self-pay

## 2023-12-05 VITALS — Wt 216.0 lb

## 2023-12-05 DIAGNOSIS — Z227 Latent tuberculosis: Secondary | ICD-10-CM

## 2023-12-05 MED ORDER — RIFAPENTINE 150 MG PO TABS
900.0000 mg | ORAL_TABLET | ORAL | Status: AC
Start: 2023-12-05 — End: 2023-12-27

## 2023-12-05 MED ORDER — ISONIAZID 300 MG PO TABS: 900.0000 mg | ORAL_TABLET | ORAL | Status: AC

## 2023-12-05 MED ORDER — VITAMIN B-6 50 MG PO TABS: 50.0000 mg | ORAL_TABLET | ORAL | Status: AC

## 2023-12-05 NOTE — Progress Notes (Signed)
 Patient in clinic today for LTBI treatment initial visit.  Assisted by his spouse, Blaiden Werth.   LTBI treatment regimen: 3HP #1 Isoniazid  900 mg weekly Rifapentine  900 mg weekly In addition/Vit. B6 50 mg weekly Medication weekly dose, interactions and possible side effects reviewed with patient. Meds fact sheets given. Treatment consent obtained.  Medication dispensed as ordered. Patient will start treatment today, return in approximately 4 weeks for second refill and labs. TB clinic appointment scheduled 12/27/2023.   TB nurse contact number given.   Patient's spouse asked about labs CBC, CMP and Seroquel  and/or other drug level testing to be done at least 1 -2 weeks after initial dose of TB meds as recommended by Neurology. Note will be assigned to Dr. Herlene for further guidance regarding this request.  Patient also taking Concerta  daily, treatment f/u appointment with Dr. Babs on 12/19/2023.   CBC and CMP obtained today as baseline. Other drug level to be done at PCP office or other provider, unless otherwise recommended by Dr. Herlene.  Patient and spouse state understanding and agreed.   Tx letter faxed to PCP. Other related consents, letters and ROI sent to medical records to be scanned.  Almarie Metro, RN

## 2023-12-06 ENCOUNTER — Ambulatory Visit: Payer: Self-pay | Admitting: Surgery

## 2023-12-06 LAB — CBC WITH DIFFERENTIAL/PLATELET
Basophils Absolute: 0.1 x10E3/uL (ref 0.0–0.2)
Basos: 1 %
EOS (ABSOLUTE): 0.5 x10E3/uL — ABNORMAL HIGH (ref 0.0–0.4)
Eos: 7 %
Hematocrit: 46.8 % (ref 37.5–51.0)
Hemoglobin: 15.7 g/dL (ref 13.0–17.7)
Immature Grans (Abs): 0 x10E3/uL (ref 0.0–0.1)
Immature Granulocytes: 0 %
Lymphocytes Absolute: 2.4 x10E3/uL (ref 0.7–3.1)
Lymphs: 31 %
MCH: 31.2 pg (ref 26.6–33.0)
MCHC: 33.5 g/dL (ref 31.5–35.7)
MCV: 93 fL (ref 79–97)
Monocytes Absolute: 0.5 x10E3/uL (ref 0.1–0.9)
Monocytes: 7 %
Neutrophils Absolute: 4.1 x10E3/uL (ref 1.4–7.0)
Neutrophils: 54 %
Platelets: 281 x10E3/uL (ref 150–450)
RBC: 5.03 x10E6/uL (ref 4.14–5.80)
RDW: 12 % (ref 11.6–15.4)
WBC: 7.6 x10E3/uL (ref 3.4–10.8)

## 2023-12-06 LAB — COMPREHENSIVE METABOLIC PANEL WITH GFR
ALT: 18 IU/L (ref 0–44)
AST: 14 IU/L (ref 0–40)
Albumin: 4.6 g/dL (ref 3.9–4.9)
Alkaline Phosphatase: 82 IU/L (ref 44–121)
BUN/Creatinine Ratio: 11 (ref 10–24)
BUN: 11 mg/dL (ref 8–27)
Bilirubin Total: 0.4 mg/dL (ref 0.0–1.2)
CO2: 25 mmol/L (ref 20–29)
Calcium: 9.7 mg/dL (ref 8.6–10.2)
Chloride: 101 mmol/L (ref 96–106)
Creatinine, Ser: 1.02 mg/dL (ref 0.76–1.27)
Globulin, Total: 2.3 g/dL (ref 1.5–4.5)
Glucose: 96 mg/dL (ref 70–99)
Potassium: 4.2 mmol/L (ref 3.5–5.2)
Sodium: 139 mmol/L (ref 134–144)
Total Protein: 6.9 g/dL (ref 6.0–8.5)
eGFR: 82 mL/min/1.73 (ref >59)

## 2023-12-19 ENCOUNTER — Encounter: Payer: Self-pay | Admitting: Physical Medicine & Rehabilitation

## 2023-12-19 ENCOUNTER — Encounter: Attending: Physical Medicine & Rehabilitation | Admitting: Physical Medicine & Rehabilitation

## 2023-12-19 VITALS — BP 142/89 | HR 74 | Ht 72.0 in | Wt 215.4 lb

## 2023-12-19 DIAGNOSIS — S069X3S Unspecified intracranial injury with loss of consciousness of 1 hour to 5 hours 59 minutes, sequela: Secondary | ICD-10-CM | POA: Insufficient documentation

## 2023-12-19 DIAGNOSIS — S069XAS Unspecified intracranial injury with loss of consciousness status unknown, sequela: Secondary | ICD-10-CM | POA: Diagnosis not present

## 2023-12-19 DIAGNOSIS — R269 Unspecified abnormalities of gait and mobility: Secondary | ICD-10-CM | POA: Diagnosis not present

## 2023-12-19 DIAGNOSIS — R4189 Other symptoms and signs involving cognitive functions and awareness: Secondary | ICD-10-CM | POA: Diagnosis present

## 2023-12-19 MED ORDER — METHYLPHENIDATE HCL ER (OSM) 36 MG PO TBCR: 36.0000 mg | EXTENDED_RELEASE_TABLET | Freq: Every day | ORAL | 0 refills | Status: DC

## 2023-12-19 MED ORDER — METHYLPHENIDATE HCL ER (OSM) 36 MG PO TBCR
36.0000 mg | EXTENDED_RELEASE_TABLET | Freq: Every day | ORAL | 0 refills | Status: DC
Start: 2023-12-19 — End: 2024-02-25
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
## 2023-12-19 NOTE — Patient Instructions (Addendum)
 ALWAYS FEEL FREE TO CALL OUR OFFICE WITH ANY PROBLEMS OR QUESTIONS [phone])  **PLEASE NOTE** ALL MEDICATION REFILL REQUESTS (INCLUDING CONTROLLED SUBSTANCES) NEED TO BE MADE AT LEAST 7 DAYS PRIOR TO REFILL BEING DUE. ANY REFILL REQUESTS INSIDE THAT TIME FRAME MAY RESULT IN DELAYS IN RECEIVING YOUR PRESCRIPTION.     TRY SOME MID AFTERNOON CAFFEINE (AROUND 2-3PM) TO HELP WITH BRAIN FATIGUE.

## 2023-12-31 ENCOUNTER — Ambulatory Visit (LOCAL_COMMUNITY_HEALTH_CENTER): Payer: Self-pay

## 2023-12-31 ENCOUNTER — Other Ambulatory Visit: Payer: Self-pay

## 2023-12-31 VITALS — Wt 217.0 lb

## 2023-12-31 DIAGNOSIS — Z227 Latent tuberculosis: Secondary | ICD-10-CM

## 2023-12-31 MED ORDER — ISONIAZID 300 MG PO TABS: 900.0000 mg | ORAL_TABLET | ORAL | Status: AC

## 2023-12-31 MED ORDER — RIFAPENTINE 150 MG PO TABS: 900.0000 mg | ORAL_TABLET | ORAL | Status: AC

## 2023-12-31 MED ORDER — VITAMIN B-6 50 MG PO TABS: 50.0000 mg | ORAL_TABLET | ORAL | Status: AC

## 2023-12-31 NOTE — Progress Notes (Signed)
 Patient has been taking 3HP weekly for 1 month for LTBI treatment.  Patient is taking medication as prescribed with assistance from wife who helps manage his medical care. He reports that the day after he takes his medication he feels muscle aches and weakness in his legs as well as nausea. He also takes a statin but this is a new muscle ache associated with LTBI tx. These are mild sx and he is comfortable continuing tx. Dr. Herlene notified.  Dispensed:  Rifapentine  150mg  tabs (24) Isoniazid  300mg  tabs (12) Pyridoxine 50mg  tabs (4)  I provided counseling today regarding the medication, we discussed the medication, the side effects and when to call clinic. Patient given the opportunity to ask questions.   Patient advised to contact [facility]/TB control phone for any concerning symptoms or questions.  Patient's next visit has been scheduled for 01/23/24.  Delon LITTIE Primrose, RN

## 2023-12-31 NOTE — Progress Notes (Signed)
 Pt came back to wrap up appointment from earlier today; see appointment notes from 11:30am appointment.  Delon LITTIE Primrose, RN

## 2024-01-01 ENCOUNTER — Ambulatory Visit: Payer: Self-pay | Admitting: Surgery

## 2024-01-01 LAB — HEPATIC FUNCTION PANEL
ALT: 18 IU/L (ref 0–44)
AST: 19 IU/L (ref 0–40)
Albumin: 4.4 g/dL (ref 3.9–4.9)
Alkaline Phosphatase: 83 IU/L (ref 47–123)
Bilirubin Total: 0.3 mg/dL (ref 0.0–1.2)
Bilirubin, Direct: 0.12 mg/dL (ref 0.00–0.40)
Total Protein: 6.8 g/dL (ref 6.0–8.5)

## 2024-01-23 ENCOUNTER — Ambulatory Visit (LOCAL_COMMUNITY_HEALTH_CENTER): Payer: Self-pay

## 2024-01-23 ENCOUNTER — Other Ambulatory Visit: Payer: Self-pay | Admitting: Family Medicine

## 2024-01-23 DIAGNOSIS — I10 Essential (primary) hypertension: Secondary | ICD-10-CM

## 2024-01-23 DIAGNOSIS — Z227 Latent tuberculosis: Secondary | ICD-10-CM

## 2024-01-23 MED ORDER — ISONIAZID 300 MG PO TABS: 900.0000 mg | ORAL_TABLET | ORAL | Status: AC

## 2024-01-23 MED ORDER — VITAMIN B-6 50 MG PO TABS: 50.0000 mg | ORAL_TABLET | ORAL | Status: AC

## 2024-01-23 MED ORDER — RIFAPENTINE 150 MG PO TABS: 900.0000 mg | ORAL_TABLET | ORAL | Status: AC

## 2024-01-23 NOTE — Progress Notes (Signed)
 In nurse clinic for LTBI Med Management 3rd visit for Rifapentine , Isoniazid , Pyridoxine to complete 12 week cycle of medications.  Walked patient to lab for LFTs ordered by Dr. DOROTHA Leghorn.  Taking medications as prescribed and has not missed any LTBI medications. No new or changes in medications since last visit. Denies new health problems since last visit. Denies alcohol  use.  The patient was dispensed Rifapentine  900 mg, Isoniazid  900 mg, and Pyridoxine 50 mg taken weekly; 4 week supply given today to complete 12 week series .  I provided counseling today regarding the medication. We discussed the medication, the side effects and when to call clinic. Patient given the opportunity to ask questions. Questions answered.    TB contact card given, completion letter given and faxed completion record sent to provider. Advised to contact with questions, concerns, side effects.  Kandi KATHEE Glatter, RN

## 2024-01-24 ENCOUNTER — Ambulatory Visit: Payer: Self-pay | Admitting: Surgery

## 2024-01-24 LAB — HEPATIC FUNCTION PANEL
ALT: 33 IU/L (ref 0–44)
AST: 27 IU/L (ref 0–40)
Albumin: 4.5 g/dL (ref 3.9–4.9)
Alkaline Phosphatase: 76 IU/L (ref 47–123)
Bilirubin Total: 0.3 mg/dL (ref 0.0–1.2)
Bilirubin, Direct: 0.13 mg/dL (ref 0.00–0.40)
Total Protein: 6.9 g/dL (ref 6.0–8.5)

## 2024-01-24 NOTE — Telephone Encounter (Signed)
 Informed wife of lab results.  Will notify TB RN if there are any concerns with this last month of treatment.

## 2024-01-25 ENCOUNTER — Other Ambulatory Visit: Payer: Self-pay | Admitting: Family Medicine

## 2024-01-25 DIAGNOSIS — E7800 Pure hypercholesterolemia, unspecified: Secondary | ICD-10-CM

## 2024-01-28 ENCOUNTER — Telehealth: Payer: Self-pay | Admitting: Specialist

## 2024-01-28 NOTE — Telephone Encounter (Signed)
 Patient called requesting refill on pain medication. Please let them know when filled. Heather HILARIO Elbe, MHA, OT/L [phone]

## 2024-02-21 ENCOUNTER — Ambulatory Visit
Admission: RE | Admit: 2024-02-21 | Discharge: 2024-02-21 | Disposition: A | Discharge status: Home / Self Care | Source: Ambulatory Visit | Attending: Internal Medicine | Admitting: Internal Medicine

## 2024-02-21 DIAGNOSIS — Z87891 Personal history of nicotine dependence: Secondary | ICD-10-CM

## 2024-02-21 DIAGNOSIS — R918 Other nonspecific abnormal finding of lung field: Secondary | ICD-10-CM

## 2024-02-21 DIAGNOSIS — R9389 Abnormal findings on diagnostic imaging of other specified body structures: Secondary | ICD-10-CM

## 2024-02-25 ENCOUNTER — Telehealth: Payer: Self-pay | Admitting: *Deleted

## 2024-02-25 DIAGNOSIS — R269 Unspecified abnormalities of gait and mobility: Secondary | ICD-10-CM

## 2024-02-25 DIAGNOSIS — S069XAS Unspecified intracranial injury with loss of consciousness status unknown, sequela: Secondary | ICD-10-CM

## 2024-02-25 MED ORDER — METHYLPHENIDATE HCL ER (OSM) 36 MG PO TBCR: 36.0000 mg | EXTENDED_RELEASE_TABLET | Freq: Every day | ORAL | 0 refills | Status: DC

## 2024-02-25 NOTE — Telephone Encounter (Signed)
 Wife called for a refill on Justin Fox's concerta .

## 2024-03-12 ENCOUNTER — Encounter: Payer: Self-pay | Admitting: *Deleted

## 2024-03-12 ENCOUNTER — Other Ambulatory Visit: Payer: Self-pay | Admitting: *Deleted

## 2024-03-12 DIAGNOSIS — Z122 Encounter for screening for malignant neoplasm of respiratory organs: Secondary | ICD-10-CM

## 2024-03-12 DIAGNOSIS — Z87891 Personal history of nicotine dependence: Secondary | ICD-10-CM
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
## 2024-03-12 NOTE — Patient Instructions (Addendum)
°#  Interstitial lung abnormalities  -This also present on CT scan June 2025  -> resolved Dec 2025 - PFT normal 03/13/2024    Plan - Glad you are staying away from the chicken coop and you got rid of the feather pillow - Avoid respiratory illness sick exposure and control your risk for respiratory infection  - be uptodate with all respiratory vaccines  - avoid sick contacts especially in areas of indoor clusters (churches, weddings, funerals, family gatherings, birthdays, planes, malls, indoor areas especially)   - mask in these areas   - discourage sick people from coming in close contact with you   QuantiFERON gold + February 2025 - Latent TB    Plan - Per DR Brayton  Screening for lung cancer and previous smoking history - stopped smking <= 15 years ago  Plan - refer LDCT screening program  - next CT around NOv/Dec 2026 (Do not do earlier(  Coronary artery Calcification ON CT SCan  Plan - refer cardioogy  Follow-up - as needed

## 2024-03-13 ENCOUNTER — Telehealth: Payer: Self-pay | Admitting: Internal Medicine

## 2024-03-13 ENCOUNTER — Ambulatory Visit: Admitting: Internal Medicine

## 2024-03-13 ENCOUNTER — Encounter: Payer: Self-pay | Admitting: Internal Medicine

## 2024-03-13 ENCOUNTER — Ambulatory Visit: Admitting: *Deleted

## 2024-03-13 VITALS — BP 132/84 | HR 66 | Temp 97.9°F | Ht 73.0 in | Wt 218.0 lb

## 2024-03-13 DIAGNOSIS — Z227 Latent tuberculosis: Secondary | ICD-10-CM | POA: Diagnosis not present

## 2024-03-13 DIAGNOSIS — I2510 Atherosclerotic heart disease of native coronary artery without angina pectoris: Secondary | ICD-10-CM

## 2024-03-13 DIAGNOSIS — R918 Other nonspecific abnormal finding of lung field: Secondary | ICD-10-CM

## 2024-03-13 DIAGNOSIS — R7612 Nonspecific reaction to cell mediated immunity measurement of gamma interferon antigen response without active tuberculosis: Secondary | ICD-10-CM

## 2024-03-13 DIAGNOSIS — Z122 Encounter for screening for malignant neoplasm of respiratory organs: Secondary | ICD-10-CM

## 2024-03-13 DIAGNOSIS — Z87891 Personal history of nicotine dependence: Secondary | ICD-10-CM | POA: Diagnosis not present

## 2024-03-13 LAB — PULMONARY FUNCTION TEST
DL/VA % pred: 89 %
DL/VA: 3.67 ml/min/mmHg/L
DLCO cor % pred: 85 %
DLCO cor: 25.09 ml/min/mmHg
DLCO unc % pred: 85 %
DLCO unc: 25.09 ml/min/mmHg
FEF 25-75 Pre: 3.39 L/s
FEF2575-%Pred-Pre: 110 %
FEV1-%Pred-Pre: 95 %
FEV1-Pre: 3.67 L
FEV1FVC-%Pred-Pre: 107 %
FEV6-%Pred-Pre: 93 %
FEV6-Pre: 4.57 L
FEV6FVC-%Pred-Pre: 104 %
FVC-%Pred-Pre: 88 %
FVC-Pre: 4.58 L
Pre FEV1/FVC ratio: 80 %
Pre FEV6/FVC Ratio: 100 %

## 2024-03-13 NOTE — Patient Instructions (Signed)
 Spirometry and diffusion capacity performed today.

## 2024-03-13 NOTE — Telephone Encounter (Signed)
 Forgot to tell him about coronary art calicfiction on ct Do not see evience of cardiology or stress test  Plan  - refer cardiology - order placed

## 2024-03-13 NOTE — Progress Notes (Signed)
 Spirometry and diffusion capacity performed today.

## 2024-03-13 NOTE — Progress Notes (Unsigned)
°  Cardiology Office Note   Date:  03/14/2024  ID:  Justin Fox, DOB February 27, 1961, MRN [id number] PCP: Sharma Coyer, MD  [facility] Providers Cardiologist:  Caron Poser, MD     History of Present Illness Justin Fox is a 63 y.o. male PMH MCI, ILD, prior TBI, HLD who presents for further evaluation management of CAC, aortic atherosclerosis, and TAA.  Patient recently seen by pulmonologist 03/13/2024.  CT scan 02/2024 showed aortic atherosclerosis and CAC.  Last LDL 143 10/2022.  Patient reports he is asymptomatic.  Denies any chest discomfort.  No new dyspnea.  No orthopnea, palpitations, syncope, edema, or other concerning cardiovascular symptoms.  Relevant CVD History -CT chest 02/2024 aortic atherosclerosis and coronary artery calcification.  TAA measuring 4.1 cm. - TTE 11/2015 normal biventricular function, 4.1 cm TAA at sinuses   ROS: Pt denies any chest discomfort, jaw pain, arm pain, palpitations, syncope, presyncope, orthopnea, PND, or LE edema.  Studies Reviewed I have independently reviewed the patient's ECG, recent CT scan, recent medical records, recent blood work.  Physical Exam VS:  BP 122/68 (BP Location: Left Arm, Patient Position: Sitting, Cuff Size: Large)   Pulse 69   Ht 6' 1 (1.854 m)   Wt 221 lb (100.2 kg)   SpO2 96%   BMI 29.16 kg/m        Wt Readings from Last 3 Encounters:  03/14/24 221 lb (100.2 kg)  03/13/24 218 lb (98.9 kg)  12/31/23 217 lb (98.4 kg)    GEN: No acute distress. NECK: No JVD; No carotid bruits. CARDIAC: RRR, no murmurs, rubs, gallops. RESPIRATORY:  Clear to auscultation. EXTREMITIES:  Warm and well-perfused. No edema.  ASSESSMENT AND PLAN CAC Aortic atherosclerosis HLD Patient presents for incidental finding of asymptomatic coronary artery calcifications and aortic atherosclerosis.  Last LDL was 143 10/2022.  Since he is asymptomatic, no further testing is required, but we will want to be  aggressive about primary prevention measures.  Plan: - Start ASA 81 mg daily - Increase Crestor  to 40 mg daily - Will need to recheck lipids in next 3 to 71-month; LDL goal less than 70 - If he develops any concerning symptoms such as exertional chest discomfort, worsening dyspnea on exertion, then we should risk stratify him further with a stress PET  TAA Stable.  4.1 cm noted on recent CT scan.  He had an echocardiogram from 2017 which also measured 4.1 cm.  Patient and his wife note that he will be getting annual CT scans with his pulmonologist.  That should be sufficient for ongoing monitoring of his TAA.  If it appears to be enlarging rapidly on the scans, then we can get a contrasted CT scan or MRA to further evaluate.        Dispo: RTC 1 year or sooner as needed  Signed, Caron Poser, MD

## 2024-03-14 ENCOUNTER — Ambulatory Visit

## 2024-03-14 VITALS — BP 122/68 | HR 69 | Ht 73.0 in | Wt 221.0 lb

## 2024-03-14 DIAGNOSIS — I2510 Atherosclerotic heart disease of native coronary artery without angina pectoris: Secondary | ICD-10-CM | POA: Diagnosis not present

## 2024-03-14 DIAGNOSIS — I700 Atherosclerosis of aorta: Secondary | ICD-10-CM

## 2024-03-14 DIAGNOSIS — E7800 Pure hypercholesterolemia, unspecified: Secondary | ICD-10-CM | POA: Diagnosis not present

## 2024-03-14 DIAGNOSIS — E782 Mixed hyperlipidemia: Secondary | ICD-10-CM | POA: Diagnosis not present

## 2024-03-14 DIAGNOSIS — I7121 Aneurysm of the ascending aorta, without rupture: Secondary | ICD-10-CM | POA: Diagnosis not present

## 2024-03-14 MED ORDER — ASPIRIN 81 MG PO TBEC: 81.0000 mg | DELAYED_RELEASE_TABLET | Freq: Every day | ORAL | Status: AC

## 2024-03-14 MED ORDER — ROSUVASTATIN CALCIUM 40 MG PO TABS: 40.0000 mg | ORAL_TABLET | Freq: Every day | ORAL | 3 refills | Status: AC

## 2024-03-14 NOTE — Patient Instructions (Signed)
 Medication Instructions:  Your physician recommends the following medication changes.  START TAKING:  Aspirin 81 mg once daily  INCREASE:  Rosuvastatin  (CRESTOR ) from 10 mg to 40 mg once daily   *If you need a refill on your cardiac medications before your next appointment, please call your pharmacy*  Lab Work:  No labs ordered today   If you have labs (blood work) drawn today and your tests are completely normal, you will receive your results only by: MyChart Message (if you have MyChart) OR A paper copy in the mail If you have any lab test that is abnormal or we need to change your treatment, we will call you to review the results.  Testing/Procedures:  No test ordered today   Follow-Up: At [facility], you and your health needs are our priority.  As part of our continuing mission to provide you with exceptional heart care, our providers are all part of one team.  This team includes your primary Cardiologist (physician) and Advanced Practice Providers or APPs (Physician Assistants and Nurse Practitioners) who all work together to provide you with the care you need, when you need it.  Your next appointment:  12 month(s)  Provider:  Caron Poser, MD    We recommend signing up for the patient portal called MyChart.  Sign up information is provided on this After Visit Summary.  MyChart is used to connect with patients for Virtual Visits (Telemedicine).  Patients are able to view lab/test results, encounter notes, upcoming appointments, etc.  Non-urgent messages can be sent to your provider as well.   To learn more about what you can do with MyChart, go to [email or web address].

## 2024-03-14 NOTE — Telephone Encounter (Signed)
 Copied from CRM #[id number]. Topic: Referral - Question >> Mar 13, 2024  3:54 PM Ismael A wrote: Reason for CRM: pt's wife, Sydelle, called in stating pt was referred to cardiology but the referral wasn't discussed in his appt this morning  - I went into referral and relayed reason:  I25.10 (ICD-10-CM) - Coronary artery calcification seen on CT scan Procedures: REF12 - AMB REFERRAL TO CARDIOLOGY  Start: Mar 13, 2024 Expiration: Mar 13, 2025 Requested: 1    She verbalized understanding and had no further questions

## 2024-03-19 ENCOUNTER — Ambulatory Visit (INDEPENDENT_AMBULATORY_CARE_PROVIDER_SITE_OTHER): Admitting: Family Medicine

## 2024-03-19 VITALS — BP 133/77 | HR 73 | Ht 73.0 in | Wt 217.0 lb

## 2024-03-19 DIAGNOSIS — J439 Emphysema, unspecified: Secondary | ICD-10-CM

## 2024-03-19 DIAGNOSIS — Z87891 Personal history of nicotine dependence: Secondary | ICD-10-CM | POA: Diagnosis not present

## 2024-03-19 DIAGNOSIS — Z8611 Personal history of tuberculosis: Secondary | ICD-10-CM

## 2024-03-19 DIAGNOSIS — I2510 Atherosclerotic heart disease of native coronary artery without angina pectoris: Secondary | ICD-10-CM

## 2024-03-19 DIAGNOSIS — Z131 Encounter for screening for diabetes mellitus: Secondary | ICD-10-CM

## 2024-03-19 DIAGNOSIS — F5101 Primary insomnia: Secondary | ICD-10-CM

## 2024-03-19 DIAGNOSIS — I10 Essential (primary) hypertension: Secondary | ICD-10-CM

## 2024-03-19 DIAGNOSIS — R4189 Other symptoms and signs involving cognitive functions and awareness: Secondary | ICD-10-CM | POA: Diagnosis not present

## 2024-03-19 DIAGNOSIS — E7800 Pure hypercholesterolemia, unspecified: Secondary | ICD-10-CM | POA: Diagnosis not present

## 2024-03-19 DIAGNOSIS — S069XAS Unspecified intracranial injury with loss of consciousness status unknown, sequela: Secondary | ICD-10-CM | POA: Diagnosis not present

## 2024-03-19 NOTE — Patient Instructions (Signed)
 To keep you healthy, please keep in mind the following health maintenance items that you are due for:   Health Maintenance Due  Topic Date Due   Pneumococcal Vaccine: 50+ Years (1 of 2 - PCV) Never done   Zoster Vaccines- Shingrix (1 of 2) Never done     Best Wishes,   Dr. Lang

## 2024-03-19 NOTE — Assessment & Plan Note (Signed)
 Pure hypercholesterolemia Chronic condition  Cholesterol levels require management due to plaque buildup. Cardiologist increased cholesterol medication from 10 mg to 40 mg. - Continue increased cholesterol medication as per cardiologist's recommendation

## 2024-03-19 NOTE — Assessment & Plan Note (Signed)
 History of latent tuberculosis Completed 12-week course of heavy antibiotics for latent TB. Recent CT scan showed no interstitial lung disease or ground glass opacity, indicating successful treatment. - Will schedule yearly CT scan next Thanksgiving

## 2024-03-19 NOTE — Assessment & Plan Note (Signed)
 Annual CT screenings F/u with pulm
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
## 2024-03-19 NOTE — Assessment & Plan Note (Signed)
 Atherosclerotic heart disease of native coronary artery Chronic  Plaque buildup identified on CT scan. Cardiologist increased cholesterol medication and recommended daily baby aspirin . - Continue increased cholesterol medication as per cardiologist's recommendation - Take daily baby aspirin  as per cardiologist's recommendation - annual f/u with cardiology

## 2024-03-19 NOTE — Assessment & Plan Note (Addendum)
 Chronic condition  F/u with neuro as scheduled  Continue concerta  36mg  daily

## 2024-03-19 NOTE — Assessment & Plan Note (Signed)
 Essential hypertension Chronic  Blood pressure is well-controlled at 133/77 mmHg with current medication regimen. - Continue amlodipine  5 mg daily

## 2024-03-19 NOTE — Assessment & Plan Note (Signed)
 Chronic  F/u with pulm  S/p treatment for latent TB

## 2024-03-19 NOTE — Assessment & Plan Note (Signed)
 Primary insomnia Chronic  Managed with Seroquel  100 mg at bedtime. Previous increase to 200 mg during TB treatment was reduced back to 100 mg after completion of treatment. - Continue Seroquel  100 mg at bedtime

## 2024-03-20 ENCOUNTER — Ambulatory Visit: Payer: Self-pay | Admitting: Family Medicine

## 2024-03-20 LAB — COMPREHENSIVE METABOLIC PANEL WITH GFR
ALT: 23 IU/L (ref 0–44)
AST: 23 IU/L (ref 0–40)
Albumin: 4.7 g/dL (ref 3.9–4.9)
Alkaline Phosphatase: 65 IU/L (ref 47–123)
BUN/Creatinine Ratio: 15 (ref 10–24)
BUN: 19 mg/dL (ref 8–27)
Bilirubin Total: 0.4 mg/dL (ref 0.0–1.2)
CO2: 26 mmol/L (ref 20–29)
Calcium: 9.8 mg/dL (ref 8.6–10.2)
Chloride: 101 mmol/L (ref 96–106)
Creatinine, Ser: 1.31 mg/dL — ABNORMAL HIGH (ref 0.76–1.27)
Globulin, Total: 2.3 g/dL (ref 1.5–4.5)
Glucose: 82 mg/dL (ref 70–99)
Potassium: 4 mmol/L (ref 3.5–5.2)
Sodium: 140 mmol/L (ref 134–144)
Total Protein: 7 g/dL (ref 6.0–8.5)
eGFR: 61 mL/min/1.73 (ref >59)

## 2024-03-20 LAB — LIPID PANEL
Chol/HDL Ratio: 3.1 ratio (ref 0.0–5.0)
Cholesterol, Total: 113 mg/dL (ref 100–199)
HDL: 37 mg/dL — ABNORMAL LOW (ref >39)
LDL Chol Calc (NIH): 43 mg/dL (ref 0–99)
Triglycerides: 209 mg/dL — ABNORMAL HIGH (ref 0–149)
VLDL Cholesterol Cal: 33 mg/dL (ref 5–40)

## 2024-03-20 LAB — CBC
Hematocrit: 45.7 % (ref 37.5–51.0)
Hemoglobin: 15.1 g/dL (ref 13.0–17.7)
MCH: 30.3 pg (ref 26.6–33.0)
MCHC: 33 g/dL (ref 31.5–35.7)
MCV: 92 fL (ref 79–97)
Platelets: 254 x10E3/uL (ref 150–450)
RBC: 4.98 x10E6/uL (ref 4.14–5.80)
RDW: 11.9 % (ref 11.6–15.4)
WBC: 8.2 x10E3/uL (ref 3.4–10.8)

## 2024-03-20 LAB — HEMOGLOBIN A1C
Est. average glucose Bld gHb Est-mCnc: 120 mg/dL
Hgb A1c MFr Bld: 5.8 % — ABNORMAL HIGH (ref 4.8–5.6)

## 2024-04-25 ENCOUNTER — Other Ambulatory Visit: Payer: Self-pay

## 2024-04-25 DIAGNOSIS — S069XAS Unspecified intracranial injury with loss of consciousness status unknown, sequela: Secondary | ICD-10-CM

## 2024-04-25 DIAGNOSIS — R269 Unspecified abnormalities of gait and mobility: Secondary | ICD-10-CM

## 2024-04-25 NOTE — Telephone Encounter (Signed)
 Mr. Gandolfo Concerta  will run out on next Thursday. Please send in a refill to [facility]. Thank you.

## 2024-04-28 MED ORDER — METHYLPHENIDATE HCL ER (OSM) 36 MG PO TBCR: 36.0000 mg | EXTENDED_RELEASE_TABLET | Freq: Every day | ORAL | 0 refills | Status: AC

## 2024-04-29 ENCOUNTER — Other Ambulatory Visit: Payer: Self-pay | Admitting: Family Medicine

## 2024-04-29 DIAGNOSIS — I10 Essential (primary) hypertension: Secondary | ICD-10-CM

## 2024-06-10 ENCOUNTER — Ambulatory Visit

## 2024-06-18 ENCOUNTER — Ambulatory Visit: Admitting: Physical Medicine & Rehabilitation

## 2024-09-22 ENCOUNTER — Ambulatory Visit: Admitting: Family Medicine
